# Patient Record
Sex: Female | Born: 1940 | Race: White | Hispanic: No | Smoking: Former smoker
Health system: Southern US, Community
[De-identification: ages and names within clinical notes are randomized; demographics above are authoritative.]

## PROBLEM LIST (undated history)

## (undated) DIAGNOSIS — M2010 Hallux valgus (acquired), unspecified foot: Secondary | ICD-10-CM

## (undated) DIAGNOSIS — K2270 Barrett's esophagus without dysplasia: Secondary | ICD-10-CM

## (undated) DIAGNOSIS — Z8719 Personal history of other diseases of the digestive system: Secondary | ICD-10-CM

## (undated) DIAGNOSIS — K5790 Diverticulosis of intestine, part unspecified, without perforation or abscess without bleeding: Secondary | ICD-10-CM

## (undated) DIAGNOSIS — K219 Gastro-esophageal reflux disease without esophagitis: Secondary | ICD-10-CM

## (undated) DIAGNOSIS — R252 Cramp and spasm: Secondary | ICD-10-CM

## (undated) DIAGNOSIS — K635 Polyp of colon: Secondary | ICD-10-CM

## (undated) DIAGNOSIS — M159 Polyosteoarthritis, unspecified: Secondary | ICD-10-CM

## (undated) DIAGNOSIS — E785 Hyperlipidemia, unspecified: Secondary | ICD-10-CM

## (undated) DIAGNOSIS — D126 Benign neoplasm of colon, unspecified: Secondary | ICD-10-CM

## (undated) DIAGNOSIS — N83202 Unspecified ovarian cyst, left side: Secondary | ICD-10-CM

## (undated) DIAGNOSIS — Z9889 Other specified postprocedural states: Secondary | ICD-10-CM

## (undated) DIAGNOSIS — K649 Unspecified hemorrhoids: Secondary | ICD-10-CM

## (undated) DIAGNOSIS — Z889 Allergy status to unspecified drugs, medicaments and biological substances status: Secondary | ICD-10-CM

## (undated) DIAGNOSIS — N819 Female genital prolapse, unspecified: Secondary | ICD-10-CM

## (undated) DIAGNOSIS — N281 Cyst of kidney, acquired: Secondary | ICD-10-CM

## (undated) DIAGNOSIS — I639 Cerebral infarction, unspecified: Secondary | ICD-10-CM

## (undated) HISTORY — DX: Other specified postprocedural states: Z98.890

## (undated) HISTORY — DX: Barrett's esophagus without dysplasia: K22.70

## (undated) HISTORY — DX: Gastro-esophageal reflux disease without esophagitis: K21.9

## (undated) HISTORY — PX: EYE SURGERY: SHX253

## (undated) HISTORY — PX: OTHER SURGICAL HISTORY: SHX169

## (undated) HISTORY — DX: Hyperlipidemia, unspecified: E78.5

## (undated) HISTORY — PX: ABDOMINAL HYSTERECTOMY: SHX81

## (undated) HISTORY — PX: JOINT REPLACEMENT: SHX530

## (undated) HISTORY — PX: APPENDECTOMY: SHX54

## (undated) HISTORY — PX: TONSILLECTOMY: SUR1361

## (undated) SURGERY — Surgical Case
Anesthesia: *Unknown

---

## 2005-06-22 HISTORY — PX: OTHER SURGICAL HISTORY: SHX169

## 2006-01-25 ENCOUNTER — Ambulatory Visit: Payer: Self-pay | Admitting: Unknown Physician Specialty

## 2006-02-12 ENCOUNTER — Ambulatory Visit: Payer: Self-pay | Admitting: Surgery

## 2006-06-22 HISTORY — PX: OSTEOTOMY TOES: SUR996

## 2007-01-13 ENCOUNTER — Ambulatory Visit: Payer: Self-pay | Admitting: Internal Medicine

## 2007-04-22 ENCOUNTER — Ambulatory Visit: Payer: Self-pay | Admitting: Internal Medicine

## 2007-04-22 ENCOUNTER — Other Ambulatory Visit: Payer: Self-pay

## 2007-05-06 ENCOUNTER — Ambulatory Visit: Payer: Self-pay | Admitting: Podiatry

## 2008-05-24 ENCOUNTER — Ambulatory Visit: Payer: Self-pay | Admitting: Internal Medicine

## 2008-05-31 ENCOUNTER — Ambulatory Visit: Payer: Self-pay | Admitting: Internal Medicine

## 2008-06-22 HISTORY — PX: CATARACT EXTRACTION: SUR2

## 2008-09-25 ENCOUNTER — Ambulatory Visit: Payer: Self-pay | Admitting: Internal Medicine

## 2008-10-04 ENCOUNTER — Ambulatory Visit: Payer: Self-pay | Admitting: Internal Medicine

## 2008-12-20 DIAGNOSIS — K2270 Barrett's esophagus without dysplasia: Secondary | ICD-10-CM

## 2008-12-20 HISTORY — PX: CARDIAC CATHETERIZATION: SHX172

## 2008-12-20 HISTORY — DX: Barrett's esophagus without dysplasia: K22.70

## 2009-01-08 ENCOUNTER — Ambulatory Visit: Payer: Self-pay | Admitting: Internal Medicine

## 2009-01-31 ENCOUNTER — Ambulatory Visit: Payer: Self-pay | Admitting: Unknown Physician Specialty

## 2009-06-22 DIAGNOSIS — Z9889 Other specified postprocedural states: Secondary | ICD-10-CM

## 2009-06-22 HISTORY — DX: Other specified postprocedural states: Z98.890

## 2009-11-26 ENCOUNTER — Ambulatory Visit: Payer: Self-pay | Admitting: Internal Medicine

## 2010-04-30 LAB — HM COLONOSCOPY

## 2010-05-01 LAB — HM MAMMOGRAPHY: HM Mammogram: NORMAL

## 2010-05-01 LAB — HM COLONOSCOPY

## 2010-05-20 ENCOUNTER — Ambulatory Visit: Payer: Self-pay | Admitting: Unknown Physician Specialty

## 2010-05-22 LAB — PATHOLOGY REPORT

## 2011-04-06 ENCOUNTER — Encounter: Payer: Self-pay | Admitting: Internal Medicine

## 2011-04-06 ENCOUNTER — Ambulatory Visit (INDEPENDENT_AMBULATORY_CARE_PROVIDER_SITE_OTHER): Payer: Medicare Other | Admitting: Internal Medicine

## 2011-04-06 DIAGNOSIS — I8390 Asymptomatic varicose veins of unspecified lower extremity: Secondary | ICD-10-CM

## 2011-04-06 DIAGNOSIS — E7849 Other hyperlipidemia: Secondary | ICD-10-CM | POA: Insufficient documentation

## 2011-04-06 DIAGNOSIS — Z1239 Encounter for other screening for malignant neoplasm of breast: Secondary | ICD-10-CM

## 2011-04-06 DIAGNOSIS — R5383 Other fatigue: Secondary | ICD-10-CM

## 2011-04-06 DIAGNOSIS — K2270 Barrett's esophagus without dysplasia: Secondary | ICD-10-CM | POA: Insufficient documentation

## 2011-04-06 DIAGNOSIS — M629 Disorder of muscle, unspecified: Secondary | ICD-10-CM

## 2011-04-06 DIAGNOSIS — H6120 Impacted cerumen, unspecified ear: Secondary | ICD-10-CM

## 2011-04-06 DIAGNOSIS — Z9889 Other specified postprocedural states: Secondary | ICD-10-CM | POA: Insufficient documentation

## 2011-04-06 DIAGNOSIS — Z1211 Encounter for screening for malignant neoplasm of colon: Secondary | ICD-10-CM | POA: Insufficient documentation

## 2011-04-06 DIAGNOSIS — E559 Vitamin D deficiency, unspecified: Secondary | ICD-10-CM

## 2011-04-06 DIAGNOSIS — K219 Gastro-esophageal reflux disease without esophagitis: Secondary | ICD-10-CM | POA: Insufficient documentation

## 2011-04-06 DIAGNOSIS — R5381 Other malaise: Secondary | ICD-10-CM

## 2011-04-06 DIAGNOSIS — M7631 Iliotibial band syndrome, right leg: Secondary | ICD-10-CM | POA: Insufficient documentation

## 2011-04-06 LAB — COMPREHENSIVE METABOLIC PANEL
CO2: 28 mEq/L (ref 19–32)
Creatinine, Ser: 0.8 mg/dL (ref 0.4–1.2)
GFR: 73.83 mL/min (ref >60.00)
Glucose, Bld: 92 mg/dL (ref 70–99)
Total Bilirubin: 0.6 mg/dL (ref 0.3–1.2)

## 2011-04-06 LAB — TSH: TSH: 1.32 u[IU]/mL (ref 0.35–5.50)

## 2011-04-06 NOTE — Assessment & Plan Note (Signed)
She had a diagnostic colonoscopy Sept 2011 for heme positive stool, a polyp was removed (elliott).

## 2011-04-06 NOTE — Assessment & Plan Note (Signed)
Recommended use of heat to relax muscles, followed by resuming PT exercises and contineu use of Alleve for inflammation

## 2011-04-06 NOTE — Patient Instructions (Signed)
We are checking your thyroid and vitmain d levels today   Try the ear candle to remove the wax buildup in your right ear.  If it does not work, return for an Web designer.  We will call you with your mammogram and vascular evaluation appointments

## 2011-04-06 NOTE — Progress Notes (Signed)
  Subjective:    Patient ID: Karen Parsons, female    DOB: 09-07-1940, 70 y.o.   MRN: [id number]  HPI Healthy postmenopausal woman 68, yr old scheduled for annual medicare physical, Presents with right lateral leg pain, persistent for several months.   She had a previous evaluation by [facility] who did x fays of hip and leg and diagnosed iliotibial tract syndrome and gae her exercises to dow,  Prior PT evaluation not helpful per patient.  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  inflammation.  She has deferred the female gyn and breast exam but does want a mammogrram.  Colonoscopy 2011,  [name]  Snare some polyps. Recordsnneedss.  Does not want DEXA.     Review of Systems     Objective:   Physical Exam        Assessment & Plan:

## 2011-04-24 ENCOUNTER — Encounter: Payer: Self-pay | Admitting: Internal Medicine

## 2011-04-24 ENCOUNTER — Ambulatory Visit (INDEPENDENT_AMBULATORY_CARE_PROVIDER_SITE_OTHER): Payer: Medicare Other | Admitting: Internal Medicine

## 2011-04-24 VITALS — BP 108/72 | HR 70 | Temp 98.3°F | Resp 14 | Ht 62.5 in | Wt 123.5 lb

## 2011-04-24 DIAGNOSIS — H6120 Impacted cerumen, unspecified ear: Secondary | ICD-10-CM

## 2011-04-24 NOTE — Progress Notes (Signed)
  Subjective:    Patient ID: Karen Parsons, female    DOB: 05-25-1941, 70 y.o.   MRN: [id number]  HPI patient retursn for cerumen disimpaction of right ear.  Has been trying Debrox and ear candles at home without results.     Review of Systems  HENT: Positive for hearing loss.        Objective:   Physical Exam  HENT:  Right Ear: Decreased hearing is noted.          Assessment & Plan:  Cerumen impaction:  Ear was irrigated with warm water continuously and canal was debrided of cerumen.

## 2011-04-30 ENCOUNTER — Encounter: Payer: Self-pay | Admitting: Internal Medicine

## 2011-10-02 ENCOUNTER — Other Ambulatory Visit: Payer: Self-pay | Admitting: Internal Medicine

## 2011-10-02 ENCOUNTER — Telehealth: Payer: Self-pay | Admitting: *Deleted

## 2011-10-02 ENCOUNTER — Ambulatory Visit: Payer: Medicare Other

## 2011-10-02 MED ORDER — AMLODIPINE BESYLATE 5 MG PO TABS: 5.0000 mg | ORAL_TABLET | Freq: Every day | ORAL | Status: DC

## 2011-10-02 NOTE — Telephone Encounter (Signed)
Triage Record Num: [id number] Operator: Lyn Hollingshead Patient Name: Karen Parsons Call Date & Time: 10/02/2011 3:53:10PM Patient Phone: [phone] PCP: Duncan Dull Patient Gender: Female PCP Fax : [phone] Patient DOB: December 21, 1940 Practice Name: [facility] Reason for Call: Caller: Karen Parsons/Patient; PCP: Duncan Dull; CB#: [phone]; ; ; Call regarding Dizzy Spells, Thinks May Be Allergy Meds;States she wants to stop allery medicine because she thinks it makes her dizzy. She is taking Aller-tec. She made an appt for Monday already but wants to cancel. Call transferred to office to cancel, unable to access cone epic. Protocol(s) Used: Office Note Recommended Outcome per Protocol: Information Noted and Sent to Office Reason for Outcome: Caller information to office Care Advice: ~     Patient is coming in today for a blood pressure check.  04/

## 2011-10-02 NOTE — Telephone Encounter (Signed)
[employer] (Daytime Triage) Fax: [phone] From: [facility] Date/ Time: 10/02/2011 9:54 AM Taken By: Lesli Albee, RN Caller: Adean Facility: Not Collected Patient: Karen Parsons, Karen Parsons DOB: July 16, 1940 Phone: [phone] Reason for Call: Caller was unable to be reached on callback - No answer, VM full/broken Regarding Appointment: No Appt Date: Appt Time: Unknown Provider: Reason: Details: Outcome:

## 2011-10-05 ENCOUNTER — Encounter: Payer: Self-pay | Admitting: Internal Medicine

## 2011-10-05 ENCOUNTER — Ambulatory Visit (INDEPENDENT_AMBULATORY_CARE_PROVIDER_SITE_OTHER): Payer: Medicare Other | Admitting: Internal Medicine

## 2011-10-05 VITALS — BP 112/68 | HR 66 | Temp 98.2°F | Resp 14 | Wt 124.0 lb

## 2011-10-05 DIAGNOSIS — K219 Gastro-esophageal reflux disease without esophagitis: Secondary | ICD-10-CM

## 2011-10-05 DIAGNOSIS — E785 Hyperlipidemia, unspecified: Secondary | ICD-10-CM

## 2011-10-05 DIAGNOSIS — R42 Dizziness and giddiness: Secondary | ICD-10-CM | POA: Insufficient documentation

## 2011-10-05 NOTE — Patient Instructions (Signed)
Low glycemic index diet, eating 6 smaller meals daily  Protein  Shakes (EAS Carb Control  Or Atkins ,  Available everywhere,   In  cases at [employer]Js )  2.5 carbs  Protein bar by Atkins (snack size,  [employer]'s)  At 10 am  Lunch: sandwich on pita bread (Joseph's makes a pita bread and a flat bread , available at [employer] and [employer]) Also Toufayah make a low carb flatbread  And Mission makes a low  carb whole wheat tortilla, both available at [employer] and [employer]  Mid day :  Another protein bar,  Or cheese stick, almonds, walnuts, pistachios, pecans, peanuts,  Macadamia nuts  Lunch:  "mean and green:"  Meat, salad, and green veggie : use ranch, vinagrette,  Blue cheese, etc  Evening : Breyer's low carb fudgiscle, ice cream (Carb Smart)   _______________________________________________________________  Try Simply saline nasal spray twice daily   Use benadryl 25 mg every 8 hours and Sudafed PE 10 mg every 8 hours to manage the drainage and congestion.  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

## 2011-10-07 ENCOUNTER — Encounter: Payer: Self-pay | Admitting: Internal Medicine

## 2011-10-07 NOTE — Progress Notes (Signed)
Patient ID: Karen Parsons, female   DOB: 05-24-1941, 71 y.o.   MRN: [id number]  Patient Active Problem List  Diagnoses  . Iliotibial band syndrome of right side  . Hyperlipidemia  . GERD (gastroesophageal reflux disease)  . S/P colonoscopy  . Barrett's esophagus  . Screening for colon cancer  . Dizziness - light-headed    Subjective:  CC:   Chief Complaint  Patient presents with  . Dizziness    HPI:   Karen Parsons a 71 y.o. female who presents Recent episode of mild dizziness without true vertigo,  Compounded by home blood pressure readings which were elevated to 160/100.  Since then has had her bp checked here which was normal, and discovered that her home machine was in need of new batteries..  Home readings have now been normal since she addressed the battery issue.  Has had some sinus congestion and post nasal drip without fevers , facial or ear pain.   No headaches or neack aches.  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 No recent travel. No chest pain, shortnesws of breath, or cough.    Past Medical History  Diagnosis Date  . Hyperlipidemia     intolerant of statins, normal diagnostic cath July 2010 Gwen Pounds)  . GERD (gastroesophageal reflux disease)   . Osteoporosis     by DEXA 2008  . S/P colonoscopy 2011    [name], polyp removed  . Barrett's esophagus July 2010    Wohl,     Past Surgical History  Procedure Date  . Abdominal hysterectomy     age 61, menorrhagia         The following portions of the patient's history were reviewed and updated as appropriate: Allergies, current medications, and problem list.    Review of Systems:   12 Pt  review of systems was negative except those addressed in the HPI,     History   Social History  . Marital Status: Widowed    Spouse Name: N/A    Number of Children: N/A  . Years of Education: N/A   Occupational History  . Not on file.   Social History Main Topics  . Smoking status:  Former Smoker    Quit date: 06/22/1968  . Smokeless tobacco: Never Used  . Alcohol Use: 1.5 oz/week    3 drink(s) per week     wine x1 a night  . Drug Use: No  . Sexually Active: Not on file   Other Topics Concern  . Not on file   Social History Narrative  . No narrative on file    Objective:  BP 112/68  Pulse 66  Temp(Src) 98.2 F (36.8 C) (Oral)  Resp 14  Wt 124 lb (56.246 kg)  SpO2 100%  General appearance: alert, cooperative and appears stated age Ears: normal TM's and external ear canals both ears Throat: lips, mucosa, and tongue normal; teeth and gums normal Neck: no adenopathy, no carotid bruit, supple, symmetrical, trachea midline and thyroid not enlarged, symmetric, no tenderness/mass/nodules Back: symmetric, no curvature. ROM normal. No CVA tenderness. Lungs: clear to auscultation bilaterally Heart: regular rate and rhythm, S1, S2 normal, no murmur, click, rub or gallop Abdomen: soft, non-tender; bowel sounds normal; no masses,  no organomegaly Pulses: 2+ and symmetric Skin: Skin color, texture, turgor normal. No rashes or lesions Lymph nodes: Cervical, supraclavicular, and axillary nodes normal.  Assessment and Plan:  Dizziness - light-headed She has a normal exam.  Symptoms are mild and appear to  be due to mild sinus congestion/Eustachian tube dysfunction. Supportive therapy recommended .  Return if symptoms escalate.   Hyperlipidemia Mild, to be managed with low glycemic index diet.   GERD (gastroesophageal reflux disease) With Barrett's esophagus noted on prior endoscopy,.  Continue daily prilosec.     Updated Medication List Outpatient Encounter Prescriptions as of 10/05/2011  Medication Sig Dispense Refill  . Chlorpheniramine Maleate (ALLERGY PO) Take by mouth.      Marland Kitchen omeprazole (PRILOSEC) 20 MG capsule Take 20 mg by mouth daily.        Marland Kitchen DISCONTD: amLODipine (NORVASC) 5 MG tablet Take 1 tablet (5 mg total) by mouth daily.  30 tablet  3      No orders of the defined types were placed in this encounter.    Return in about 3 years (around 10/05/2014).

## 2011-10-07 NOTE — Assessment & Plan Note (Signed)
She has a normal exam.  Symptoms are mild and appear to be due to mild sinus congestion/Eustachian tube dysfunction. Supportive therapy recommended .  Return if symptoms escalate.

## 2011-10-07 NOTE — Assessment & Plan Note (Signed)
With Barrett's esophagus noted on prior endoscopy,.  Continue daily prilosec.

## 2011-10-07 NOTE — Assessment & Plan Note (Signed)
Mild, to be managed with low glycemic index diet.

## 2011-10-23 ENCOUNTER — Ambulatory Visit (INDEPENDENT_AMBULATORY_CARE_PROVIDER_SITE_OTHER): Payer: Medicare Other | Admitting: Internal Medicine

## 2011-10-23 VITALS — BP 120/72 | HR 82 | Temp 97.6°F | Resp 14 | Ht 63.0 in | Wt 125.8 lb

## 2011-10-23 DIAGNOSIS — H6690 Otitis media, unspecified, unspecified ear: Secondary | ICD-10-CM

## 2011-10-23 DIAGNOSIS — H6693 Otitis media, unspecified, bilateral: Secondary | ICD-10-CM

## 2011-10-23 MED ORDER — AZITHROMYCIN 500 MG PO TABS: 500.0000 mg | ORAL_TABLET | Freq: Every day | ORAL | Status: AC

## 2011-10-23 MED ORDER — PREDNISONE 10 MG PO TABS: ORAL_TABLET | ORAL | Status: DC

## 2011-10-25 ENCOUNTER — Encounter: Payer: Self-pay | Admitting: Internal Medicine

## 2011-10-25 DIAGNOSIS — H6693 Otitis media, unspecified, bilateral: Secondary | ICD-10-CM | POA: Insufficient documentation

## 2011-10-25 NOTE — Assessment & Plan Note (Signed)
Bilateral secondary to prolonged sinus congestion. Will treat with azithromycin x7 days, due to penicillin allergy, and six-day prednisone taper. I have asked her to stop the evening dose of Sudafed and use Afrin if needed for congestion.

## 2011-10-25 NOTE — Progress Notes (Signed)
Patient ID: Karen Parsons, female   DOB: January 25, 1941, 71 y.o.   MRN: [id number] ote Patient Active Problem List  Diagnoses  . Iliotibial band syndrome of right side  . Hyperlipidemia  . GERD (gastroesophageal reflux disease)  . S/P colonoscopy  . Barrett's esophagus  . Screening for colon cancer  . Dizziness - light-headed  . Otitis media of both ears    Subjective:  CC:   No chief complaint on file.   HPI:   Karen Parsons a 71 y.o. female who presents For follow up on viral URI and cerumen impaction impaction noted  last week during evaluation for dizziness. .  the dizziness has resolved with management of the viral URI. She has been taking Benadyl and sudafed as directed but has noticed that at night when she lies down her ears have been throbbing and pulsating. Her symptoms are controlled during the day. She does drink a glass of one or 2 at night and wonders if the combination of Sudafed and wine may be e causing her symptoms. She denies headaches sinus drainage sinus pain and sore throat. No fevers.   Past Medical History  Diagnosis Date  . Hyperlipidemia     intolerant of statins, normal diagnostic cath July 2010 Gwen Pounds)  . GERD (gastroesophageal reflux disease)   . Osteoporosis     by DEXA 2008  . S/P colonoscopy 2011    [name], polyp removed  . Barrett's esophagus July 2010    Wohl,     Past Surgical History  Procedure Date  . Abdominal hysterectomy     age 68, menorrhagia         The following portions of the patient's history were reviewed and updated as appropriate: Allergies, current medications, and problem list.    Review of Systems:   12 Pt  review of systems was negative except those addressed in the HPI,     History   Social History  . Marital Status: Widowed    Spouse Name: N/A    Number of Children: N/A  . Years of Education: N/A   Occupational History  . Not on file.   Social History Main Topics  . Smoking status: Former  Smoker    Quit date: 06/22/1968  . Smokeless tobacco: Never Used  . Alcohol Use: 1.5 oz/week    3 drink(s) per week     wine x1 a night  . Drug Use: No  . Sexually Active: Not on file   Other Topics Concern  . Not on file   Social History Narrative  . No narrative on file    Objective:  BP 120/72  Pulse 82  Temp 97.6 F (36.4 C)  Resp 14  Ht 5\' 3"  (1.6 m)  Wt 125 lb 12 oz (57.04 kg)  BMI 22.28 kg/m2  SpO2 97%  General appearance: alert, cooperative and appears stated age Ears: normal TM's and external ear canals both ears Throat: lips, mucosa, and tongue normal; teeth and gums normal Neck: no adenopathy, no carotid bruit, supple, symmetrical, trachea midline and thyroid not enlarged, symmetric, no tenderness/mass/nodules HEENT: no sinus tenderness.  Bilateral TM injection and effusion.  Back: symmetric, no curvature. ROM normal. No CVA tenderness. Lungs: clear to auscultation bilaterally Heart: regular rate and rhythm, S1, S2 normal, no murmur, click, rub or gallop Abdomen: soft, non-tender; bowel sounds normal; no masses,  no organomegaly Pulses: 2+ and symmetric Skin: Skin color, texture, turgor normal. No rashes or lesions Lymph nodes: Cervical, supraclavicular, and axillary nodes  normal.  Assessment and Plan:  Otitis media of both ears Bilateral secondary to prolonged sinus congestion. Will treat with azithromycin x7 days, due to penicillin allergy, and six-day prednisone taper. I have asked her to stop the evening dose of Sudafed and use Afrin if needed for congestion.    Updated Medication List Outpatient Encounter Prescriptions as of 10/23/2011  Medication Sig Dispense Refill  . azithromycin (ZITHROMAX) 500 MG tablet Take 1 tablet (500 mg total) by mouth daily.  7 tablet  0  . Chlorpheniramine Maleate (ALLERGY PO) Take by mouth.      Marland Kitchen omeprazole (PRILOSEC) 20 MG capsule Take 20 mg by mouth daily.        . predniSONE (DELTASONE) 10 MG tablet 60 mg on Day 1,  decrease by 10 mg daily until gone.  21 tablet  0     No orders of the defined types were placed in this encounter.    No Follow-up on file.

## 2011-11-19 ENCOUNTER — Emergency Department: Payer: Self-pay | Admitting: Emergency Medicine

## 2011-11-30 ENCOUNTER — Telehealth: Payer: Self-pay | Admitting: Internal Medicine

## 2011-11-30 DIAGNOSIS — M79671 Pain in right foot: Secondary | ICD-10-CM

## 2011-11-30 NOTE — Telephone Encounter (Signed)
I called patient and notified her of the message, she stated this has been over a week and a half ago and she went straight to the ED then.  They did x rays and stated nothing was broken.  She stated she is much better now, and will call back in one week if her arm still has the bruise.
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
## 2011-11-30 NOTE — Telephone Encounter (Signed)
Ice ankle. Elevate leg often for the swelling, send to [facility] for  xrays of right heel.  i will put order in epic.  Ibuprofen 800 mg every 8 hours  Tylenol 500 every 6 hours

## 2012-02-10 ENCOUNTER — Encounter: Payer: Self-pay | Admitting: Internal Medicine

## 2012-02-17 ENCOUNTER — Ambulatory Visit: Payer: Self-pay | Admitting: Ophthalmology

## 2012-02-29 ENCOUNTER — Ambulatory Visit: Payer: Self-pay | Admitting: Ophthalmology

## 2012-04-05 ENCOUNTER — Ambulatory Visit: Payer: Self-pay | Admitting: Unknown Physician Specialty

## 2012-04-08 LAB — PATHOLOGY REPORT

## 2012-04-22 ENCOUNTER — Telehealth: Payer: Self-pay | Admitting: Internal Medicine

## 2012-04-22 NOTE — Telephone Encounter (Signed)
Caller: Marrisa/Patient; Patient Name: Karen Parsons; PCP: Duncan Dull (Adults only); Best Callback Phone Number: [phone]; Reason for call: constipation; symptoms started 04/17/12;  Ducolax was taken and had small BM on 04/18/12;  took more Ducolax on 04/21/12 and started feeling very nauseated and dizzy; vomited x1 on 04/21/12; was successful with a BM on 04/22/12 and had 2 BM's today; feeling okay today; no symptoms at this time; pt wanted to know if the Ducolax could have caused these sx on 04/21/12; explained that it could have or if she was a little dehydrated at the time;  instructed to call back if develop any symptoms; will comply; Health Information , No Triage Guideline;

## 2012-05-09 ENCOUNTER — Encounter: Payer: Self-pay | Admitting: Internal Medicine

## 2012-07-29 ENCOUNTER — Ambulatory Visit (INDEPENDENT_AMBULATORY_CARE_PROVIDER_SITE_OTHER): Payer: Medicare Other | Admitting: Internal Medicine

## 2012-07-29 ENCOUNTER — Encounter: Payer: Self-pay | Admitting: Internal Medicine

## 2012-07-29 VITALS — BP 114/68 | HR 65 | Temp 98.0°F | Resp 16 | Wt 123.5 lb

## 2012-07-29 DIAGNOSIS — R5381 Other malaise: Secondary | ICD-10-CM

## 2012-07-29 DIAGNOSIS — R5383 Other fatigue: Secondary | ICD-10-CM

## 2012-07-29 DIAGNOSIS — Z9889 Other specified postprocedural states: Secondary | ICD-10-CM

## 2012-07-29 DIAGNOSIS — M7551 Bursitis of right shoulder: Secondary | ICD-10-CM

## 2012-07-29 DIAGNOSIS — M67919 Unspecified disorder of synovium and tendon, unspecified shoulder: Secondary | ICD-10-CM

## 2012-07-29 DIAGNOSIS — K2270 Barrett's esophagus without dysplasia: Secondary | ICD-10-CM

## 2012-07-29 DIAGNOSIS — Z79899 Other long term (current) drug therapy: Secondary | ICD-10-CM

## 2012-07-29 LAB — COMPREHENSIVE METABOLIC PANEL
ALT: 22 U/L (ref 0–35)
BUN: 23 mg/dL (ref 6–23)
CO2: 30 mEq/L (ref 19–32)
Calcium: 9.5 mg/dL (ref 8.4–10.5)
Chloride: 103 mEq/L (ref 96–112)
Creatinine, Ser: 0.9 mg/dL (ref 0.4–1.2)
GFR: 65.14 mL/min (ref >60.00)
Total Bilirubin: 0.8 mg/dL (ref 0.3–1.2)

## 2012-07-29 LAB — CBC WITH DIFFERENTIAL/PLATELET
Basophils Relative: 0.6 % (ref 0.0–3.0)
Eosinophils Relative: 3.3 % (ref 0.0–5.0)
Hemoglobin: 13.2 g/dL (ref 12.0–15.0)
Lymphocytes Relative: 28.3 % (ref 12.0–46.0)
Monocytes Relative: 8.5 % (ref 3.0–12.0)
Neutro Abs: 3.6 10*3/uL (ref 1.4–7.7)
Neutrophils Relative %: 59.3 % (ref 43.0–77.0)
RBC: 4.26 Mil/uL (ref 3.87–5.11)
WBC: 6.1 10*3/uL (ref 4.5–10.5)

## 2012-07-29 NOTE — Patient Instructions (Addendum)
Your shoulder pain sounds like bursitis and can be treated with aleve according to instruction on bottle, and you can add tylenol 500 mg up to 4 tablets daily.  Avoid any weight for 4 weeks .  Keep doing ROM  But not weights or bands.   Mission carb balance whole wheat burrito is 6 net carbs   Dreamfield's low carb pasta  Is delicious  And 5 net carbs !!

## 2012-07-29 NOTE — Progress Notes (Signed)
Patient ID: Karen Parsons, female   DOB: 19-Aug-1940, 72 y.o.   MRN: [id number]  Patient Active Problem List  Diagnosis  . Hyperlipidemia  . GERD (gastroesophageal reflux disease)  . S/P colonoscopy  . Barrett's esophagus  . Dizziness - light-headed  . Bursitis of right shoulder    Subjective:  CC:   Chief Complaint  Patient presents with  . Shoulder pain    right side    HPI:   Karen Whiteis a 72 y.o. female who presents for evaluation of right shoulder pain.  The pain has been present for one month.  It has been aggravated by recent change in exerise regimen to include use of light weights at the [facility].  The pain is located in the deltoid region and brought on by abduction and internal rotation ; also brought on by picking up a bottle of laundry detergent with an outstretched arm    Past Medical History  Diagnosis Date  . Hyperlipidemia     intolerant of statins, normal diagnostic cath July 2010 Gwen Pounds)  . GERD (gastroesophageal reflux disease)   . Osteoporosis     by DEXA 2008  . S/P colonoscopy 2011    [name], polyp removed  . Barrett's esophagus July 2010    Wohl,     Past Surgical History  Procedure Laterality Date  . Abdominal hysterectomy      age 12, menorrhagia  . Cardiac catheterization Left July 2010    [name]:  normal  . Medial meniscotomy Left 2007    Reita Chard  . Osteotomy toes Right 2008    [name]  . Cataract extraction Left 2010    [name]   The following portions of the patient's history were reviewed and updated as appropriate: Allergies, current medications, and problem list.  Review of Systems:   Patient denies headache, fevers, malaise, unintentional weight loss, skin rash, eye pain, sinus congestion and sinus pain, sore throat, dysphagia,  hemoptysis , cough, dyspnea, wheezing, chest pain, palpitations, orthopnea, edema, abdominal pain, nausea, melena, diarrhea, constipation, flank pain, dysuria, hematuria, urinary  Frequency,  nocturia, numbness, tingling, seizures,  Focal weakness, Loss of consciousness,  Tremor, insomnia, depression, anxiety, and suicidal ideation.     History   Social History  . Marital Status: Widowed    Spouse Name: N/A    Number of Children: N/A  . Years of Education: N/A   Occupational History  . Not on file.   Social History Main Topics  . Smoking status: Former Smoker    Quit date: 06/22/1968  . Smokeless tobacco: Never Used  . Alcohol Use: 1.5 oz/week    3 drink(s) per week     Comment: wine x1 a night  . Drug Use: No  . Sexually Active: Not on file   Other Topics Concern  . Not on file   Social History Narrative  . No narrative on file    Objective:  BP 114/68  Pulse 65  Temp(Src) 98 F (36.7 C) (Oral)  Resp 16  Wt 123 lb 8 oz (56.019 kg)  BMI 21.88 kg/m2  SpO2 97%  General appearance: alert, cooperative and appears stated age Neck: no adenopathy, no carotid bruit, supple, symmetrical, trachea midline and thyroid not enlarged, symmetric, no tenderness/mass/nodules Back: symmetric, no curvature. ROM normal. No CVA tenderness.  Lungs: clear to auscultation bilaterally Heart: regular rate and rhythm, S1, S2 normal, no murmur, click, rub or gallop Abdomen: soft, non-tender; bowel sounds normal; no masses,  no organomegaly Pulses: 2+ and  symmetric MSK:  Right shoulder with full ROM, no pain with passive motions. Pain elicited with forced abduction flexion and internal rotaiton  Assessment and Plan:  Bursitis of right shoulder Suggested by history and exam,.  Rec suspension of of all weights , use of NSAIDS and follow up in  4 weeks    Updated Medication List Outpatient Encounter Prescriptions as of 07/29/2012  Medication Sig Dispense Refill  . omeprazole (PRILOSEC) 20 MG capsule Take 20 mg by mouth daily.        Marland Kitchen triamcinolone cream (KENALOG) 0.1 % Apply to skin twice daily.      . [DISCONTINUED] Chlorpheniramine Maleate (ALLERGY PO) Take by mouth.      .  [DISCONTINUED] predniSONE (DELTASONE) 10 MG tablet 60 mg on Day 1, decrease by 10 mg daily until gone.  21 tablet  0   No facility-administered encounter medications on file as of 07/29/2012.     Orders Placed This Encounter  Procedures  . Comprehensive metabolic panel  . TSH  . CBC with Differential  . HM COLONOSCOPY  . HM COLONOSCOPY    No Follow-up on file.

## 2012-07-31 ENCOUNTER — Encounter: Payer: Self-pay | Admitting: Internal Medicine

## 2012-07-31 DIAGNOSIS — M7551 Bursitis of right shoulder: Secondary | ICD-10-CM | POA: Insufficient documentation

## 2012-07-31 NOTE — Assessment & Plan Note (Signed)
Suggested by history and exam,.  Rec suspension of of all weights , use of NSAIDS and follow up in  4 weeks

## 2012-08-01 ENCOUNTER — Encounter: Payer: Self-pay | Admitting: *Deleted

## 2012-10-27 ENCOUNTER — Ambulatory Visit (INDEPENDENT_AMBULATORY_CARE_PROVIDER_SITE_OTHER): Payer: Medicare Other | Admitting: Internal Medicine

## 2012-10-27 ENCOUNTER — Encounter: Payer: Self-pay | Admitting: Internal Medicine

## 2012-10-27 VITALS — BP 112/68 | HR 99 | Temp 98.1°F | Resp 14 | Wt 121.2 lb

## 2012-10-27 DIAGNOSIS — L03039 Cellulitis of unspecified toe: Secondary | ICD-10-CM

## 2012-10-27 DIAGNOSIS — Z1239 Encounter for other screening for malignant neoplasm of breast: Secondary | ICD-10-CM

## 2012-10-27 DIAGNOSIS — L02619 Cutaneous abscess of unspecified foot: Secondary | ICD-10-CM

## 2012-10-27 DIAGNOSIS — D126 Benign neoplasm of colon, unspecified: Secondary | ICD-10-CM

## 2012-10-27 DIAGNOSIS — L02611 Cutaneous abscess of right foot: Secondary | ICD-10-CM

## 2012-10-27 DIAGNOSIS — M7551 Bursitis of right shoulder: Secondary | ICD-10-CM

## 2012-10-27 DIAGNOSIS — M719 Bursopathy, unspecified: Secondary | ICD-10-CM

## 2012-10-27 MED ORDER — CEPHALEXIN 500 MG PO TABS: 500.0000 mg | ORAL_TABLET | Freq: Three times a day (TID) | ORAL | Status: DC

## 2012-10-27 MED ORDER — TRAMADOL HCL 50 MG PO TABS: 50.0000 mg | ORAL_TABLET | Freq: Three times a day (TID) | ORAL | Status: DC | PRN

## 2012-10-27 NOTE — Progress Notes (Signed)
Patient ID: Karen Parsons, female   DOB: [birth year], 72 y.o.   MRN: [id number]   Patient Active Problem List   Diagnosis Date Noted  . Cellulitis and abscess of toe 10/29/2012  . Screening for breast cancer 10/29/2012  . Tubular adenoma of colon 10/29/2012  . Bursitis of right shoulder 07/31/2012  . Dizziness - light-headed 10/05/2011  . Hyperlipidemia   . GERD (gastroesophageal reflux disease)   . S/P colonoscopy   . Barrett's esophagus     Subjective:  CC:   Chief Complaint  Patient presents with  . Acute Visit    C\O right 3 phalang on right foot Karen Parsons nodule with redness.    HPI:   Karen Parsons a 72 y.o. female who presents with pain and swelling of the medial side of her 3rd toe on  right foot 5 days ago.  Symptoms started after treated a self diagnosed corn with a medicated pad for corn reduction .  After several days of wearing the pad, she noticed the skin become swollen with a central punctum.  elling,   Tried soaking in epsom salts and aleve for pain with moderate transient relief of pain without change in skin appearance,  No systemic  symptoms    Past Medical History  Diagnosis Date  . Hyperlipidemia     intolerant of statins, normal diagnostic cath July 2010 Karen Parsons)  . GERD (gastroesophageal reflux disease)   . Osteoporosis     by DEXA 2008  . S/P colonoscopy 2011    Karen Parsons, polyp removed  . Barrett's esophagus July 2010    Karen Parsons,     Past Surgical History  Procedure Laterality Date  . Abdominal hysterectomy      age 48, menorrhagia  . Cardiac catheterization Left July 2010    Karen Parsons:  normal  . Medial meniscotomy Left 2007    Karen Parsons  . Osteotomy toes Right 2008    Karen Parsons  . Cataract extraction Left 2010    Karen Parsons       The following portions of the patient's history were reviewed and updated as appropriate: Allergies, current medications, and problem list.    Review of Systems:   Patient denies headache, fevers, malaise,  unintentional weight loss, skin rash, eye pain, sinus congestion and sinus pain, sore throat, dysphagia,  hemoptysis , cough, dyspnea, wheezing, chest pain, palpitations, orthopnea, edema, abdominal pain, nausea, melena, diarrhea, constipation, flank pain, dysuria, hematuria, urinary  Frequency, nocturia, numbness, tingling, seizures,  Focal weakness, Loss of consciousness,  Tremor, insomnia, depression, anxiety, and suicidal ideation.     History   Social History  . Marital Status: Widowed    Spouse Name: N/A    Number of Children: N/A  . Years of Education: N/A   Occupational History  . Not on file.   Social History Main Topics  . Smoking status: Former Smoker    Quit date: 06/22/1968  . Smokeless tobacco: Never Used  . Alcohol Use: 1.5 oz/week    3 drink(s) per week     Comment: wine x1 a night  . Drug Use: No  . Sexually Active: Not on file   Other Topics Concern  . Not on file   Social History Narrative  . No narrative on file    Objective:  BP 112/68  Pulse 99  Temp(Src) 98.1 F (36.7 C) (Oral)  Resp 14  Wt 121 lb 4 oz (54.999 kg)  BMI 21.48 kg/m2  SpO2 98%  General appearance: alert, cooperative and appears  stated age Ears: normal TM's and external ear canals both ears Throat: lips, mucosa, and tongue normal; teeth and gums normal Neck: no adenopathy, no carotid bruit, supple, symmetrical, trachea midline and thyroid not enlarged, symmetric, no tenderness/mass/nodules Back: symmetric, no curvature. ROM normal. No CVA tenderness. Lungs: clear to auscultation bilaterally Heart: regular rate and rhythm, S1, S2 normal, no murmur, click, rub or gallop Abdomen: soft, non-tender; bowel sounds normal; no masses,  no organomegaly Pulses: 2+ and symmetric Skin: Skin color, texture, turgor normal. No rashes or lesions. 3rd toe medial side, macerated corn with central punctum  Lymph nodes: Cervical, supraclavicular, and axillary nodes normal.  Assessment and  Plan:  Cellulitis and abscess of toe Presumed , caused by irritation and maceration of skin surrounding her corn caused by medicated pad and soaking.  Advised to stop soaking foot,  Stop using the pad . Keflex prescribed.  Bursitis of right shoulder improved with activity modification and use of NSAIDS  Screening for breast cancer Her is overdue for her annual wellness exam and mammogram.  Referral made   Updated Medication List Outpatient Encounter Prescriptions as of 10/27/2012  Medication Sig Dispense Refill  . omeprazole (PRILOSEC) 20 MG capsule Take 20 mg by mouth daily.        . Cephalexin 500 MG tablet Take 1 tablet (500 mg total) by mouth 3 (three) times daily.  40 tablet  0  . traMADol (ULTRAM) 50 MG tablet Take 1 tablet (50 mg total) by mouth every 8 (eight) hours as needed for pain.  60 tablet  1  . triamcinolone cream (KENALOG) 0.1 % Apply to skin twice daily.       No facility-administered encounter medications on file as of 10/27/2012.     Orders Placed This Encounter  Procedures  . HM MAMMOGRAPHY  . HM DEXA SCAN  . MM Digital Screening  . HM COLONOSCOPY    No Follow-up on file.

## 2012-10-27 NOTE — Patient Instructions (Addendum)
I will call in keflex to prevent infection.   And tramadol for severe pain   Continue the alleve,  No more soaks ,   Ok to add tylenol to aleve first before you try the tramadol for severe pain   Take the keflex with food to avoid upset stomach

## 2012-10-29 ENCOUNTER — Telehealth: Payer: Self-pay | Admitting: Internal Medicine

## 2012-10-29 ENCOUNTER — Encounter: Payer: Self-pay | Admitting: Internal Medicine

## 2012-10-29 DIAGNOSIS — L02619 Cutaneous abscess of unspecified foot: Secondary | ICD-10-CM | POA: Insufficient documentation

## 2012-10-29 DIAGNOSIS — D126 Benign neoplasm of colon, unspecified: Secondary | ICD-10-CM | POA: Insufficient documentation

## 2012-10-29 DIAGNOSIS — Z1239 Encounter for other screening for malignant neoplasm of breast: Secondary | ICD-10-CM | POA: Insufficient documentation

## 2012-10-29 NOTE — Assessment & Plan Note (Signed)
Presumed , caused by irritation and maceration of skin surrounding her corn caused by medicated pad and soaking.  Advised to stop soaking foot,  Stop using the pad . Keflex prescribed.

## 2012-10-29 NOTE — Telephone Encounter (Signed)
Please confirm that she is overude for a mammogram, per review of chart.Karen Parsons through [employer]

## 2012-10-29 NOTE — Assessment & Plan Note (Signed)
Her is overdue for her annual wellness exam and mammogram.  Referral made

## 2012-10-29 NOTE — Assessment & Plan Note (Signed)
improved with activity modification and use of NSAIDS

## 2012-10-31 NOTE — Telephone Encounter (Signed)
Patient notified stated does she really need mammogram at  8? Please advise.

## 2012-11-01 NOTE — Telephone Encounter (Signed)
Patient agree's and will have mammogram.

## 2012-11-01 NOTE — Telephone Encounter (Signed)
Yes she does,  Given how healthy she is and how long I expect her to live.  (Unless she would not want any treatment if something was found, no matter how small)

## 2012-11-04 ENCOUNTER — Telehealth: Payer: Self-pay | Admitting: *Deleted

## 2012-11-04 NOTE — Telephone Encounter (Signed)
Patient says the antibiotic you have given her for her toe is giving her diarrhea and she cannot take it and show homes , she has taken for one week is it okay to stop. 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

## 2012-11-05 NOTE — Telephone Encounter (Signed)
Yes ok to stop after one week.  If the diarrhea has not improved after stopping abcx will need to be seen

## 2012-11-07 NOTE — Telephone Encounter (Signed)
Left message, advising pt and to call back if symptoms persist or worsen.

## 2012-12-01 ENCOUNTER — Ambulatory Visit: Payer: Self-pay | Admitting: Internal Medicine

## 2012-12-05 ENCOUNTER — Telehealth: Payer: Self-pay | Admitting: Internal Medicine

## 2012-12-05 DIAGNOSIS — R5383 Other fatigue: Secondary | ICD-10-CM

## 2012-12-05 DIAGNOSIS — R5381 Other malaise: Secondary | ICD-10-CM

## 2012-12-05 NOTE — Telephone Encounter (Signed)
The patient is feeling tired and she is wanting a B-12 shot.

## 2012-12-07 NOTE — Telephone Encounter (Signed)
Labs ordered to check for causes of fatigue. Needs follow up visit after labs , not same day

## 2012-12-07 NOTE — Telephone Encounter (Signed)
Called patient states she is having fatigue, and wondering could it be coming from the weather or should she have labs done to check B12, please advise.

## 2012-12-08 NOTE — Telephone Encounter (Signed)
LMTCB & schedule a lab appt & a f/u with Dr. Darrick Huntsman (not on same day)

## 2012-12-09 ENCOUNTER — Ambulatory Visit: Payer: Self-pay | Admitting: Unknown Physician Specialty

## 2012-12-09 NOTE — Telephone Encounter (Signed)
Lab appointment 6/25 and appointment with dr Darrick Huntsman 7/2   Pt aware

## 2012-12-13 LAB — PATHOLOGY REPORT

## 2012-12-14 ENCOUNTER — Other Ambulatory Visit (INDEPENDENT_AMBULATORY_CARE_PROVIDER_SITE_OTHER): Payer: Medicare Other

## 2012-12-14 DIAGNOSIS — R5381 Other malaise: Secondary | ICD-10-CM

## 2012-12-14 DIAGNOSIS — R5383 Other fatigue: Secondary | ICD-10-CM

## 2012-12-14 LAB — CBC WITH DIFFERENTIAL/PLATELET
Basophils Absolute: 0 10*3/uL (ref 0.0–0.1)
Eosinophils Relative: 2.4 % (ref 0.0–5.0)
Monocytes Absolute: 0.5 10*3/uL (ref 0.1–1.0)
Monocytes Relative: 10 % (ref 3.0–12.0)
Neutrophils Relative %: 60.1 % (ref 43.0–77.0)
Platelets: 232 10*3/uL (ref 150.0–400.0)
WBC: 4.9 10*3/uL (ref 4.5–10.5)

## 2012-12-14 LAB — BASIC METABOLIC PANEL
Calcium: 10 mg/dL (ref 8.4–10.5)
GFR: 73.48 mL/min (ref >60.00)
Sodium: 139 mEq/L (ref 135–145)

## 2012-12-14 LAB — VITAMIN B12: Vitamin B-12: 297 pg/mL (ref 211–911)

## 2012-12-15 LAB — FOLATE RBC: RBC Folate: 839 ng/mL (ref >=366)

## 2012-12-16 ENCOUNTER — Encounter: Payer: Self-pay | Admitting: *Deleted

## 2012-12-20 ENCOUNTER — Telehealth: Payer: Self-pay | Admitting: Internal Medicine

## 2012-12-20 NOTE — Telephone Encounter (Signed)
Pt stated she received her labs results and wanted to know if she needs to still come tomorrow for her appointment

## 2012-12-20 NOTE — Telephone Encounter (Signed)
Pt states she has not heard back regarding whether or not she needs to keep her appt with Dr. Darrick Huntsman tomorrow.  Please advise pt if she needs to keep or cancel this appt.

## 2012-12-21 ENCOUNTER — Ambulatory Visit: Payer: Medicare Other | Admitting: Internal Medicine

## 2012-12-21 ENCOUNTER — Encounter: Payer: Self-pay | Admitting: Internal Medicine

## 2012-12-21 NOTE — Telephone Encounter (Signed)
Patient cancelled

## 2013-01-13 ENCOUNTER — Encounter: Payer: Self-pay | Admitting: Internal Medicine

## 2013-02-23 ENCOUNTER — Ambulatory Visit: Payer: Medicare Other | Admitting: Internal Medicine

## 2013-07-08 ENCOUNTER — Telehealth: Payer: Self-pay | Admitting: Internal Medicine

## 2013-07-08 NOTE — Telephone Encounter (Signed)
Pt has scheduled an appt with Raquel & will discuss referral at that time

## 2013-07-08 NOTE — Telephone Encounter (Signed)
Pt left message wanting to get a referral for gyn

## 2013-07-11 ENCOUNTER — Ambulatory Visit (INDEPENDENT_AMBULATORY_CARE_PROVIDER_SITE_OTHER): Payer: Medicare Other | Admitting: Adult Health

## 2013-07-11 ENCOUNTER — Encounter: Payer: Self-pay | Admitting: Adult Health

## 2013-07-11 ENCOUNTER — Encounter (INDEPENDENT_AMBULATORY_CARE_PROVIDER_SITE_OTHER): Payer: Self-pay

## 2013-07-11 VITALS — BP 120/78 | HR 62 | Temp 97.6°F | Resp 12 | Wt 120.0 lb

## 2013-07-11 DIAGNOSIS — N819 Female genital prolapse, unspecified: Secondary | ICD-10-CM

## 2013-07-11 MED ORDER — IPRATROPIUM BROMIDE 0.03 % NA SOLN: 2.0000 | Freq: Two times a day (BID) | NASAL | Status: DC

## 2013-07-11 NOTE — Progress Notes (Signed)
Pre visit review using our clinic review tool, if applicable. No additional management support is needed unless otherwise documented below in the visit note. 

## 2013-07-11 NOTE — Assessment & Plan Note (Signed)
Exam normal other than large hemorrhoid observed. Pt has hx of hysterectomy although she believes they left her uterus (??)  She observed something protruding at the opening of the vagina. ? Cystocele or rectocele. She has appt with GYN on 07/17/12 which she made in advance; however, required referral. Referral made.

## 2013-07-11 NOTE — Progress Notes (Signed)
   Subjective:    Patient ID: Karen Parsons, female    DOB: June 30, 1940, 73 y.o.   MRN: [id number]  HPI Patient is a very pleasant 73 year old female who presents to clinic with concerns of her "uterus coming out of her vagina". She reports feeling a bulge. She then looked at it with a mirror and saw something protruding out of her vagina. She already has an appt to be evaluated by GYN but her insurance requires a referral. So she is here for evaluation. Note, pt has hx of hysterectomy in her 56s. She reports that her surgeon told her he "was leaving the playpen". Not exactly sure what this meant. Pt believes that he removed her ovaries but left the uterus (?)  Current Outpatient Prescriptions on File Prior to Visit  Medication Sig Dispense Refill  . omeprazole (PRILOSEC) 20 MG capsule Take 20 mg by mouth daily.         No current facility-administered medications on file prior to visit.      Review of Systems  Genitourinary: Negative for vaginal bleeding, vaginal discharge, vaginal pain and pelvic pain.       Bulge out of vagina  All other systems reviewed and are negative.       Objective:   Physical Exam  Constitutional: She is oriented to person, place, and time. She appears well-developed and well-nourished. No distress.  Cardiovascular: Normal rate and regular rhythm.   Pulmonary/Chest: Effort normal. No respiratory distress.  Abdominal: Soft. Bowel sounds are normal.  Genitourinary:    There is no rash, tenderness, lesion or injury on the right labia. There is no rash, tenderness, lesion or injury on the left labia. No erythema, tenderness or bleeding around the vagina. No foreign body around the vagina. No signs of injury around the vagina. No vaginal discharge found.  Musculoskeletal: Normal range of motion.  Neurological: She is alert and oriented to person, place, and time.    BP 120/78  Pulse 62  Temp(Src) 97.6 F (36.4 C) (Oral)  Resp 12  Wt 120 lb (54.432 kg)   SpO2 97%        Assessment & Plan:

## 2013-07-11 NOTE — Patient Instructions (Signed)
  I sent in a prescription for atrovent nasal spray - use every 12 hours.  Referral to GYN has been entered.  Please let us know if we may be of further assistance

## 2013-07-13 ENCOUNTER — Encounter: Payer: Self-pay | Admitting: Adult Health

## 2013-07-13 ENCOUNTER — Ambulatory Visit (INDEPENDENT_AMBULATORY_CARE_PROVIDER_SITE_OTHER): Payer: Medicare Other | Admitting: Adult Health

## 2013-07-13 VITALS — BP 100/60 | HR 66 | Temp 98.1°F | Resp 12 | Wt 121.5 lb

## 2013-07-13 DIAGNOSIS — R238 Other skin changes: Secondary | ICD-10-CM | POA: Insufficient documentation

## 2013-07-13 DIAGNOSIS — L989 Disorder of the skin and subcutaneous tissue, unspecified: Secondary | ICD-10-CM

## 2013-07-13 NOTE — Assessment & Plan Note (Signed)
Stop zyrtec and atrovent for few days. Use dove soap. Apply vaseline or aloe lotion to the area. RTC if no improvement.

## 2013-07-13 NOTE — Progress Notes (Signed)
Pre visit review using our clinic review tool, if applicable. No additional management support is needed unless otherwise documented below in the visit note. 

## 2013-07-13 NOTE — Progress Notes (Signed)
   Subjective:    Patient ID: Karen Parsons, female    DOB: [birth year], 73 y.o.   MRN: [id number]  HPI Patient is a pleasant 73 year old female who was seen in clinic a few days ago and she returns today with irritation right underneath her nose. Her nasal passages are not irritated it is the skin right underneath her nose above her lip. She had been complaining of rhinorrhea. She started taking Zyrtec. She has also been using Cerave skin cleanser. She has been blowing her nose secondary to the rhinorrhea. The area is more visually disturbing to the patient than anything else. She was also started on atrovent nasal spray to help with the rhinorrhea.  Current Outpatient Prescriptions on File Prior to Visit  Medication Sig Dispense Refill  . cetirizine (ZYRTEC) 10 MG tablet Take 10 mg by mouth daily.      Marland Kitchen ipratropium (ATROVENT) 0.03 % nasal spray Place 2 sprays into both nostrils every 12 (twelve) hours.  30 mL  2  . omeprazole (PRILOSEC) 20 MG capsule Take 20 mg by mouth daily.         No current facility-administered medications on file prior to visit.   Review of Systems  Skin:       Irritation underneath her nose       Objective:   Physical Exam  Skin:  Skin underneath her nose is red and irritated. Appears chapped.          Assessment & Plan:

## 2013-09-05 ENCOUNTER — Telehealth: Payer: Self-pay | Admitting: Internal Medicine

## 2013-09-05 NOTE — Telephone Encounter (Signed)
FYI- appt scheduled with Raquel for tomorrow

## 2013-09-05 NOTE — Telephone Encounter (Signed)
Patient Information:  Caller Name: Margarette  Phone: [phone]  Patient: Karen Parsons, Karen Parsons  Gender: Female  DOB: 06/21/1941  Age: 73 Years  PCP: Deborra Medina (Adults only)  Office Follow Up:  Does the office need to follow up with this patient?: No  Instructions For The Office: N/A  RN Note:  Will try to get an appt. for this pt. No appts. today and does not want to go to another location. Appt. scheduled for 09/06/13 at 08:15 with Raquel Rey to evaluate ear pain. Will take Tylenol if needed for pain.  Symptoms  Reason For Call & Symptoms: Started with aching and flu-like symptoms on 08/31/13.Went to Dr. on 09/03/13 and diagnosed with UTI. Placed on Sepra DS. Now having a lot of ear pain in the Lf. ear. No drainage.  Reviewed Health History In EMR: Yes  Reviewed Medications In EMR: Yes  Reviewed Allergies In EMR: Yes  Reviewed Surgeries / Procedures: Yes  Date of Onset of Symptoms: 09/03/2013  Guideline(s) Used:  Earache  Disposition Per Guideline:   See Today in Office  Reason For Disposition Reached:   All other earaches (Exceptions: earache lasting < 1 hour, and earache from air travel)  Advice Given:  Call Back If  Earache last more than 1 hour  High fever, severe headache, or stiff neck occurs  You become worse.  Patient Will Follow Care Advice:  YES  Appointment Scheduled:  09/06/2013 08:15:00 Appointment Scheduled Provider:  Charolette Forward

## 2013-09-06 ENCOUNTER — Ambulatory Visit (INDEPENDENT_AMBULATORY_CARE_PROVIDER_SITE_OTHER): Payer: Medicare Other | Admitting: Adult Health

## 2013-09-06 ENCOUNTER — Encounter: Payer: Self-pay | Admitting: Adult Health

## 2013-09-06 VITALS — BP 106/74 | HR 61 | Temp 98.2°F | Resp 12 | Wt 123.0 lb

## 2013-09-06 DIAGNOSIS — Z79899 Other long term (current) drug therapy: Secondary | ICD-10-CM

## 2013-09-06 LAB — BASIC METABOLIC PANEL
BUN: 16 mg/dL (ref 6–23)
CHLORIDE: 104 meq/L (ref 96–112)
CO2: 26 meq/L (ref 19–32)
CREATININE: 1.1 mg/dL (ref 0.4–1.2)
Calcium: 9.2 mg/dL (ref 8.4–10.5)
GFR: 52.62 mL/min — ABNORMAL LOW (ref >60.00)
Glucose, Bld: 95 mg/dL (ref 70–99)
Potassium: 3.9 mEq/L (ref 3.5–5.1)
Sodium: 139 mEq/L (ref 135–145)

## 2013-09-06 NOTE — Progress Notes (Signed)
Pre visit review using our clinic review tool, if applicable. No additional management support is needed unless otherwise documented below in the visit note. 

## 2013-09-06 NOTE — Progress Notes (Signed)
Patient ID: Karen Parsons, female   DOB: Oct 31, 1940, 73 y.o.   MRN: [id number]    Subjective:    Patient ID: Karen Parsons, female    DOB: September 09, 1940, 73 y.o.   MRN: [id number]  HPI  Pt is a 73 y/o female who presents to clinic with report that she was having left ear pain. On Sunday she went to urgent care at [facility] because she was not feeling well. 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 She had body aches and pain. She had low back pain. At this time her ear was not bothering her yet. She took NyQuil but on Sunday did not feel any better which is when she went to the urgent care. She was swabed for the flu which was negative. They found that she had a UTI and treated her with Bactrim x 7 days. She then started to have shooting ear pain in the left ear on Monday. She reports the pain improved yesterday. No report of hearing loss. She is here for a follow up since she is getting ready to leave to [place] on Tuesday.   Past Medical History  Diagnosis Date  . Hyperlipidemia     intolerant of statins, normal diagnostic cath July 2010 Nehemiah Massed)  . GERD (gastroesophageal reflux disease)   . Osteoporosis     by DEXA 2008  . S/P colonoscopy 2011    [name], polyp removed  . Barrett's esophagus July 2010    Wohl,     Current Outpatient Prescriptions on File Prior to Visit  Medication Sig Dispense Refill  . cetirizine (ZYRTEC) 10 MG tablet Take 10 mg by mouth daily.      Marland Kitchen ipratropium (ATROVENT) 0.03 % nasal spray Place 2 sprays into both nostrils every 12 (twelve) hours.  30 mL  2  . omeprazole (PRILOSEC) 20 MG capsule Take 20 mg by mouth daily.         No current facility-administered medications on file prior to visit.     Review of Systems  Constitutional: Positive for fatigue (feels a little "shaky").  HENT: Positive for ear pain (improved since yesterday).   Respiratory: Negative.   Cardiovascular: Negative.   Gastrointestinal:  Negative for nausea, vomiting and diarrhea.  Genitourinary: Negative.  Negative for dysuria, frequency, hematuria, flank pain, difficulty urinating and pelvic pain.  Musculoskeletal: Positive for back pain (improved since starting antibiotic).  Neurological: Negative.  Negative for weakness.  Psychiatric/Behavioral: Negative.   All other systems reviewed and are negative.       Objective:  BP 106/74  Pulse 61  Temp(Src) 98.2 F (36.8 C) (Oral)  Resp 12  Wt 123 lb (55.792 kg)  SpO2 95%   Physical Exam  Constitutional: She is oriented to person, place, and time. No distress.  HENT:  Head: Normocephalic and atraumatic.  Right Ear: External ear normal.  Left Ear: External ear normal.  Cardiovascular: Normal rate and regular rhythm.   Pulmonary/Chest: Effort normal. No respiratory distress.  Lymphadenopathy:    She has no cervical adenopathy.  Neurological: She is alert and oriented to person, place, and time.  Psychiatric: She has a normal mood and affect. Her behavior is normal. Judgment and thought content normal.       Assessment & Plan:   1. Medication management Pt started on bactrim on Sunday. Symptoms overall improving. She is feeling a little "shaky". Check metabolic panel. - Basic metabolic panel

## 2013-09-08 ENCOUNTER — Telehealth: Payer: Self-pay | Admitting: Internal Medicine

## 2013-09-08 NOTE — Telephone Encounter (Signed)
Patient Information:  Caller Name: Randell  Phone: [phone]  Patient: Karen Parsons, Karen Parsons  Gender: Female  DOB: 12-Jun-1941  Age: 73 Years  PCP: Deborra Medina (Adults only)  Office Follow Up:  Does the office need to follow up with this patient?: No  Instructions For The Office: N/A  RN Note:  Patient seen in follow up for UTI.  States she has new onset headache, left jaw pain, and mild wheezing.  Is being treated for UTI with Bactrim.  Onset 09/06/13.  States she is "fine until she lies down to go to bed, and then the headache hits."  Per headache protocol, emergent symptoms denied; advised appt within 24 hours.  No appts available 09/08/13 in office; refuses appt elsewhere.  States will go to [facility].  krs/can  Symptoms  Reason For Call & Symptoms: follow up UTI  Reviewed Health History In EMR: Yes  Reviewed Medications In EMR: Yes  Reviewed Allergies In EMR: Yes  Reviewed Surgeries / Procedures: Yes  Date of Onset of Symptoms: 09/06/2013  Guideline(s) Used:  Headache  Disposition Per Guideline:   See Today or Tomorrow in Office  Reason For Disposition Reached:   Unexplained headache that is present > 24 hours  Advice Given:  N/A  Patient Refused Recommendation:  Patient Will Go To [facility]  Patient refuses appt elsewhere krs/can

## 2013-09-08 NOTE — Telephone Encounter (Signed)
FYI-pt to Urgent Care for headache

## 2013-09-12 ENCOUNTER — Encounter: Payer: Self-pay | Admitting: Adult Health

## 2013-10-02 ENCOUNTER — Other Ambulatory Visit: Payer: Self-pay | Admitting: Adult Health

## 2013-10-02 HISTORY — PX: HEMORRHOID SURGERY: SHX153

## 2013-11-24 ENCOUNTER — Other Ambulatory Visit: Payer: Self-pay | Admitting: Adult Health

## 2013-12-12 ENCOUNTER — Ambulatory Visit: Payer: Self-pay | Admitting: General Practice

## 2013-12-21 ENCOUNTER — Ambulatory Visit: Payer: Self-pay | Admitting: General Practice

## 2013-12-27 ENCOUNTER — Ambulatory Visit: Payer: Self-pay | Admitting: General Practice

## 2014-04-17 ENCOUNTER — Ambulatory Visit (INDEPENDENT_AMBULATORY_CARE_PROVIDER_SITE_OTHER): Payer: Medicare Other | Admitting: Internal Medicine

## 2014-04-17 ENCOUNTER — Encounter: Payer: Self-pay | Admitting: Internal Medicine

## 2014-04-17 VITALS — BP 140/88 | HR 64 | Temp 97.5°F | Resp 16 | Ht 62.5 in | Wt 119.5 lb

## 2014-04-17 DIAGNOSIS — R5383 Other fatigue: Secondary | ICD-10-CM

## 2014-04-17 DIAGNOSIS — M25461 Effusion, right knee: Secondary | ICD-10-CM

## 2014-04-17 DIAGNOSIS — Z1239 Encounter for other screening for malignant neoplasm of breast: Secondary | ICD-10-CM

## 2014-04-17 DIAGNOSIS — E785 Hyperlipidemia, unspecified: Secondary | ICD-10-CM

## 2014-04-17 DIAGNOSIS — Z23 Encounter for immunization: Secondary | ICD-10-CM

## 2014-04-17 DIAGNOSIS — E559 Vitamin D deficiency, unspecified: Secondary | ICD-10-CM

## 2014-04-17 DIAGNOSIS — Z0000 Encounter for general adult medical examination without abnormal findings: Secondary | ICD-10-CM

## 2014-04-17 NOTE — Progress Notes (Signed)
Patient ID: Karen Parsons, female   DOB: 10/25/1940, 73 y.o.   MRN: [id number]  The patient is here for annual Medicare wellness examination and management of other chronic and acute problems.  Left ear pain .  No history of viral syndrome, pain was sudden and severe, resolved spontaneously.   Right knee pain  History of torn  meniscus s/p repair and debridement by [facility] in July ,  Followed by PT.  cortisone shot in September for persistent pain and swelling,  Oct 5th started doing PT  In early October, and on the 4th visit she was told to do squats and lateral walking and felt a tear.   Worked in for repeat ortho eval ,  All activity stopped , ice elevation and rest prescribed for 6 weeks,  2 weeks into it . STill has pain when using the stairs.  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  The roster of all physicians providing medical care to patient - is listed in the Snapshot section of the chart.  Activities of daily living:  The patient is 100% independent in all ADLs: dressing, toileting, feeding as well as independent mobility  Home safety : The patient has smoke detectors in the home. They wear seatbelts.  There are no firearms at home. There is no violence in the home.   There is no risks for hepatitis, STDs or HIV. There is no   history of blood transfusion. They have no travel history to infectious disease endemic areas of the world.  The patient has seen their dentist in the last six month. They have seen their eye doctor in the last year. They admit to slight hearing difficulty with regard to whispered voices and some television programs.  They have deferred audiologic testing in the last year.  They do not  have excessive sun exposure. Discussed the need for sun protection: hats, long sleeves and use of sunscreen if there is significant sun exposure.   Diet: the  importance of a healthy diet is discussed. They do have a healthy diet.  The benefits of regular aerobic exercise were discussed. She walks 4 times per week ,  20 minutes.   Depression screen: there are no signs or vegative symptoms of depression- irritability, change in appetite, anhedonia, sadness/tearfullness.  Cognitive assessment: the patient manages all their financial and personal affairs and is actively engaged. They could relate day,date,year and events; recalled 2/3 objects at 3 minutes; performed clock-face test normally.  The following portions of the patient's history were reviewed and updated as appropriate: allergies, current medications, past family history, past medical history,  past surgical history, past social history  and problem list.  Visual acuity was not assessed per patient preference since she has regular follow up with her ophthalmologist. Hearing and body mass index were assessed and reviewed.   During the course of the visit the patient was educated and counseled about appropriate screening and preventive services including : fall prevention , diabetes screening, nutrition counseling, colorectal cancer screening, and recommended immunizations.    Objective:  BP 140/88  Pulse 64  Temp(Src) 97.5 F (36.4 C) (Oral)  Resp 16  Ht 5' 2.5" (1.588 m)  Wt 119 lb 8 oz (54.205 kg)  BMI 21.50 kg/m2  SpO2 98%  General appearance: alert, cooperative and appears stated age Head: Normocephalic, without obvious abnormality, atraumatic Eyes: conjunctivae/corneas  clear. PERRL, EOM's intact. Fundi benign. Ears: normal TM's and external ear canals both ears Nose: Nares normal. Septum midline. Mucosa normal. No drainage or sinus tenderness. Throat: lips, mucosa, and tongue normal; teeth and gums normal Neck: no adenopathy, no carotid bruit, no JVD, supple, symmetrical, trachea midline and thyroid not enlarged, symmetric, no tenderness/mass/nodules Lungs: clear to  auscultation bilaterally Breasts: normal appearance, no masses or tenderness Heart: regular rate and rhythm, S1, S2 normal, no murmur, click, rub or gallop Abdomen: soft, non-tender; bowel sounds normal; no masses,  no organomegaly Extremities: extremities normal, atraumatic, no cyanosis or edema Pulses: 2+ and symmetric Skin: Skin color, texture, turgor normal. No rashes or lesions Neurologic: Alert and oriented X 3, normal strength and tone. Normal symmetric reflexes. Normal coordination and gait.   Assessment and Plan:  Medicare annual wellness visit, subsequent Annual Medicare wellness  exam was done as well as a comprehensive physical exam and management of acute and chronic conditions .  During the course of the visit the patient was educated and counseled about appropriate screening and preventive services including : fall prevention , diabetes screening, nutrition counseling, colorectal cancer screening, and recommended immunizations.  Printed recommendations for health maintenance screenings was given.    Updated Medication List Outpatient Encounter Prescriptions as of 04/17/2014  Medication Sig  . ipratropium (ATROVENT) 0.03 % nasal spray PLACE 2 SPRAYS INTO BOTH NOSTRILS EVERY 12 (TWELVE) HOURS.  Marland Kitchen omeprazole (PRILOSEC) 20 MG capsule Take 20 mg by mouth daily.    . cetirizine (ZYRTEC) 10 MG tablet Take 10 mg by mouth daily.  . [DISCONTINUED] ipratropium (ATROVENT) 0.03 % nasal spray PLACE 2 SPRAYS INTO BOTH NOSTRILS EVERY 12 (TWELVE) HOURS.  . [DISCONTINUED] sulfamethoxazole-trimethoprim (BACTRIM DS) 800-160 MG per tablet Take 800 tablets by mouth.

## 2014-04-17 NOTE — Patient Instructions (Signed)
You had your annual  wellness exam today.    You can schedule your mammogram now  You received the new pneumonia vaccine today.     We will contact you with the bloodwork results  I recommend getting the majority of your calcium and Vitamin D  through diet rather than supplements given the recent association of calcium supplements with increased coronary artery calcium scores (You need 1200 mg daily )   Unsweetened almond/coconut milk is a great low calorie low carb, cholesterol free  way to increase your dietary calcium and vitamin D.

## 2014-04-18 ENCOUNTER — Encounter: Payer: Self-pay | Admitting: Internal Medicine

## 2014-04-18 DIAGNOSIS — Z0000 Encounter for general adult medical examination without abnormal findings: Secondary | ICD-10-CM | POA: Insufficient documentation

## 2014-04-18 LAB — CBC WITH DIFFERENTIAL/PLATELET
Basophils Absolute: 0 10*3/uL (ref 0.0–0.1)
Basophils Relative: 0.7 % (ref 0.0–3.0)
Eosinophils Absolute: 0.1 10*3/uL (ref 0.0–0.7)
Eosinophils Relative: 1.6 % (ref 0.0–5.0)
HEMATOCRIT: 42 % (ref 36.0–46.0)
HEMOGLOBIN: 13.6 g/dL (ref 12.0–15.0)
LYMPHS ABS: 1.6 10*3/uL (ref 0.7–4.0)
Lymphocytes Relative: 28.1 % (ref 12.0–46.0)
MCHC: 32.5 g/dL (ref 30.0–36.0)
MCV: 93.6 fl (ref 78.0–100.0)
MONOS PCT: 9.3 % (ref 3.0–12.0)
Monocytes Absolute: 0.5 10*3/uL (ref 0.1–1.0)
NEUTROS ABS: 3.3 10*3/uL (ref 1.4–7.7)
Neutrophils Relative %: 60.3 % (ref 43.0–77.0)
PLATELETS: 257 10*3/uL (ref 150.0–400.0)
RBC: 4.49 Mil/uL (ref 3.87–5.11)
RDW: 14.9 % (ref 11.5–15.5)
WBC: 5.6 10*3/uL (ref 4.0–10.5)

## 2014-04-18 LAB — LIPID PANEL
Cholesterol: 293 mg/dL — ABNORMAL HIGH (ref 0–200)
HDL: 85.3 mg/dL (ref >39.00)
LDL Cholesterol: 197 mg/dL — ABNORMAL HIGH (ref 0–99)
NONHDL: 207.7
Total CHOL/HDL Ratio: 3
Triglycerides: 56 mg/dL (ref 0.0–149.0)
VLDL: 11.2 mg/dL (ref 0.0–40.0)

## 2014-04-18 LAB — COMPREHENSIVE METABOLIC PANEL
ALK PHOS: 65 U/L (ref 39–117)
ALT: 13 U/L (ref 0–35)
AST: 20 U/L (ref 0–37)
Albumin: 3.8 g/dL (ref 3.5–5.2)
BILIRUBIN TOTAL: 0.8 mg/dL (ref 0.2–1.2)
BUN: 23 mg/dL (ref 6–23)
CO2: 24 mEq/L (ref 19–32)
Calcium: 9.5 mg/dL (ref 8.4–10.5)
Chloride: 103 mEq/L (ref 96–112)
Creatinine, Ser: 0.9 mg/dL (ref 0.4–1.2)
GFR: 67.42 mL/min (ref >60.00)
GLUCOSE: 90 mg/dL (ref 70–99)
Potassium: 4.2 mEq/L (ref 3.5–5.1)
Sodium: 140 mEq/L (ref 135–145)
Total Protein: 7.1 g/dL (ref 6.0–8.3)

## 2014-04-18 LAB — VITAMIN D 25 HYDROXY (VIT D DEFICIENCY, FRACTURES): VITD: 24.38 ng/mL — AB (ref 30.00–100.00)

## 2014-04-18 LAB — TSH: TSH: 1.21 u[IU]/mL (ref 0.35–4.50)

## 2014-04-18 NOTE — Assessment & Plan Note (Signed)

## 2014-04-19 MED ORDER — RED YEAST RICE 600 MG PO CAPS: 1.0000 | ORAL_CAPSULE | Freq: Two times a day (BID) | ORAL | Status: DC

## 2014-04-19 MED ORDER — ERGOCALCIFEROL 1.25 MG (50000 UT) PO CAPS: 50000.0000 [IU] | ORAL_CAPSULE | ORAL | Status: DC

## 2014-04-19 NOTE — Addendum Note (Signed)
Addended by: Crecencio Mc on: 04/19/2014 10:03 PM   Modules accepted: Orders

## 2014-04-20 ENCOUNTER — Encounter: Payer: Self-pay | Admitting: *Deleted

## 2014-05-04 NOTE — Telephone Encounter (Signed)
Mailed unread message to pt  

## 2014-05-15 ENCOUNTER — Ambulatory Visit: Payer: Self-pay | Admitting: Internal Medicine

## 2014-05-20 ENCOUNTER — Telehealth: Payer: Self-pay | Admitting: Internal Medicine

## 2014-05-20 NOTE — Telephone Encounter (Signed)
Her mammogram was abnormal on the left.  They saw  Asymmetry  and want additional images, and will be calling her to set those up, .  I prefer to choose the surgeon with the patient if a biopsy is recommended, so if they tell her she needs a biopsy, she can tell them she is going to discuss it with me first.

## 2014-05-21 NOTE — Telephone Encounter (Signed)
Pt notified and  verbalized understanding. As not been contacted yet, advised to call [facility] to discuss.

## 2014-05-21 NOTE — Telephone Encounter (Signed)
Left message for pt to return my call.

## 2014-05-23 ENCOUNTER — Ambulatory Visit: Payer: Self-pay | Admitting: Internal Medicine

## 2014-05-30 ENCOUNTER — Encounter: Payer: Self-pay | Admitting: Internal Medicine

## 2014-05-31 ENCOUNTER — Ambulatory Visit: Payer: Self-pay | Admitting: Nurse Practitioner

## 2014-06-07 ENCOUNTER — Other Ambulatory Visit: Payer: Self-pay | Admitting: Internal Medicine

## 2014-06-07 MED ORDER — IPRATROPIUM BROMIDE 0.03 % NA SOLN: 2.0000 | Freq: Two times a day (BID) | NASAL | Status: DC

## 2014-06-14 ENCOUNTER — Encounter: Payer: Self-pay | Admitting: Internal Medicine

## 2014-06-28 ENCOUNTER — Encounter: Payer: Self-pay | Admitting: *Deleted

## 2014-06-28 NOTE — Telephone Encounter (Signed)
From: Amarachukwu Lakatos Sent: 05/07/2014 4:44 PM EST To: Aviva Kluver Subject: RE:Flu Shot Clinic Saturday March 17, 2014  I already had my flu shot ----- Message ----- From: Genia Plants Sent: 03/10/2014 6:37 PM EDT To: Meryl Crutch Subject: Flu Shot Clinic Saturday March 17, 2014  [facility] will hold a [facility] on Saturday, September 26 from 9 am to noon. In order to plan appropriately, we ask you to please call the office to schedule an appointment time during the clinic. Our schedulers are ready to assist you, Monday through Friday, 8 am to 5 pm at [phone]. Thank you for choosing [facility] for your healthcare needs.

## 2014-08-21 ENCOUNTER — Encounter: Payer: Self-pay | Admitting: Internal Medicine

## 2014-08-21 ENCOUNTER — Ambulatory Visit (INDEPENDENT_AMBULATORY_CARE_PROVIDER_SITE_OTHER): Payer: Medicare Other | Admitting: Internal Medicine

## 2014-08-21 VITALS — BP 118/70 | HR 80 | Temp 98.8°F | Resp 16 | Ht 63.0 in | Wt 119.2 lb

## 2014-08-21 DIAGNOSIS — J111 Influenza due to unidentified influenza virus with other respiratory manifestations: Secondary | ICD-10-CM

## 2014-08-21 DIAGNOSIS — M79605 Pain in left leg: Secondary | ICD-10-CM

## 2014-08-21 DIAGNOSIS — K2270 Barrett's esophagus without dysplasia: Secondary | ICD-10-CM

## 2014-08-21 DIAGNOSIS — J029 Acute pharyngitis, unspecified: Secondary | ICD-10-CM

## 2014-08-21 DIAGNOSIS — J1189 Influenza due to unidentified influenza virus with other manifestations: Secondary | ICD-10-CM

## 2014-08-21 LAB — POC INFLUENZA A&B (BINAX/QUICKVUE)
INFLUENZA A, POC: NEGATIVE
INFLUENZA B, POC: NEGATIVE

## 2014-08-21 MED ORDER — PROMETHAZINE HCL 25 MG RE SUPP: 25.0000 mg | Freq: Four times a day (QID) | RECTAL | Status: DC | PRN

## 2014-08-21 MED ORDER — OSELTAMIVIR PHOSPHATE 75 MG PO CAPS: 75.0000 mg | ORAL_CAPSULE | Freq: Two times a day (BID) | ORAL | Status: DC

## 2014-08-21 NOTE — Progress Notes (Signed)
Pre-visit discussion using our clinic review tool. No additional management support is needed unless otherwise documented below in the visit note.  

## 2014-08-21 NOTE — Patient Instructions (Addendum)
.  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

## 2014-08-21 NOTE — Progress Notes (Signed)
Patient ID: Karen Parsons, female   DOB: Oct 22, 1940, 74 y.o.   MRN: [id number]  Patient Active Problem List   Diagnosis Date Noted  . Influenza with respiratory manifestations 08/22/2014  . Leg pain, left 08/22/2014  . Medicare annual wellness visit, subsequent 04/18/2014  . Prolapse of female pelvic organs 07/11/2013  . Screening for breast cancer 10/29/2012  . Tubular adenoma of colon 10/29/2012  . Hyperlipidemia   . GERD (gastroesophageal reflux disease)   . S/P colonoscopy   . Barrett's esophagus     Subjective:  CC:   Chief Complaint  Patient presents with  . Leg Pain    Right leg pain radiating from hip down leg.cannot put weight on right leg.  . Sore Throat    hoarse, scratchy, last 24 hours. difficulty swallowing lying down X 1 week  . Cough    chills body aches.    HPI: Of persistent Diasia Karen Parsons is a 74 y.o. female who presents for evaluation of persistent leg pain .  Woke up this morning  With sore throat,  Aching all over "like a Mack truck hit me" accompanied by chills and skin hurting  Flu vaccine received back in October at [place] .  Lots of sick contacts.   orignial cc:  persistent left knee pain despite surgery and a cortisone shot .  Was sent by Ortho to [facility] after MRI of lumbar spine showed spinal stenosis  At multiple levels.  The steroid shot on Dec 31helped for a while,  Then hip started really hurting  Both hip joints looked fine,  Got a cortisone shot for bursitis Jan 28th .    Recently turned a funny way and had pain that shot all th way down ot the ankle on the right side .  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 Hyperlipidemia     intolerant of statins, normal diagnostic cath July 2010 Nehemiah Massed)  . GERD (gastroesophageal reflux  disease)   . Osteoporosis     by DEXA 2008  . S/P colonoscopy 2011    Karen Parsons, polyp removed  . Barrett's esophagus July 2010    Karen Parsons,     Past Surgical History  Procedure Laterality Date  . Abdominal hysterectomy      age 73, menorrhagia  . Cardiac catheterization Left July 2010    [name]:  normal  . Medial meniscotomy Left 2007    [name]  . Osteotomy toes Right 2008    Karen Parsons  . Cataract extraction Left 2010    Karen Parsons       The following portions of the patient's history were reviewed and updated as appropriate: Allergies, current medications, and problem list.    Review of Systems:   Patient denies headache, fevers, malaise, unintentional weight loss, skin rash, eye pain, sinus congestion and sinus pain, sore throat, dysphagia,  hemoptysis , cough, dyspnea, wheezing, chest pain, palpitations, orthopnea, edema, abdominal pain, nausea, melena, diarrhea, constipation, flank pain, dysuria, hematuria, urinary  Frequency, nocturia, numbness, tingling, seizures,  Focal weakness, Loss of consciousness,  Tremor, insomnia, depression, anxiety, and suicidal ideation.     History   Social History  . Marital Status: Widowed    Spouse Name: Karen Parsons  .  Number of Children: Karen Parsons  . Years of Education: Karen Parsons   Occupational History  . Not on file.   Social History Main Topics  . Smoking status: Former Smoker    Quit date: 06/22/1968  . Smokeless tobacco: Never Used  . Alcohol Use: 1.5 oz/week    3 drink(s) per week     Comment: wine x1 a night  . Drug Use: No  . Sexual Activity: Not on file   Other Topics Concern  . Not on file   Social History Narrative    Objective:  Filed Vitals:   08/21/14 1402  BP: 118/70  Pulse: 80  Temp: 98.8 F (37.1 C)  Resp: 16     General appearance: alert, cooperative and appears stated age Ears: normal TM's and external ear canals both ears Throat: lips, mucosa, and tongue normal; teeth and gums normal Neck: no  adenopathy, no carotid bruit, supple, symmetrical, trachea midline and thyroid not enlarged, symmetric, no tenderness/mass/nodules Back: symmetric, no curvature. ROM normal. No CVA tenderness. Lungs: clear to auscultation bilaterally Heart: regular rate and rhythm, S1, S2 normal, no murmur, click, rub or gallop Abdomen: soft, non-tender; bowel sounds normal; no masses,  no organomegaly Pulses: 2+ and symmetric Skin: Skin color, texture, turgor normal. No rashes or lesions Lymph nodes: Cervical, supraclavicular, and axillary nodes normal.  Assessment and Plan:  Influenza with respiratory manifestations Treating with tamiflu based on symptoms.    Barrett's esophagus Currently managed with PPI, but she is concerned about the recently published studies suggesting an association with increased risk of dementia .  Advised to continue PPI  Indefinitely due to BE     Leg pain, left She has had imaging and orthopedic evaluations which failed to find a source for her pain .  PT evaluation advised.     Updated Medication List Outpatient Encounter Prescriptions as of 08/21/2014  Medication Sig  . ergocalciferol (DRISDOL) 50000 UNITS capsule Take 1 capsule (50,000 Units total) by mouth once a week.  Marland Kitchen ipratropium (ATROVENT) 0.03 % nasal spray Place 2 sprays into both nostrils 2 (two) times daily.  Marland Kitchen omeprazole (PRILOSEC) 20 MG capsule Take 20 mg by mouth daily.    . cetirizine (ZYRTEC) 10 MG tablet Take 10 mg by mouth daily.  Marland Kitchen oseltamivir (TAMIFLU) 75 MG capsule Take 1 capsule (75 mg total) by mouth 2 (two) times daily.  . promethazine (PROMETHEGAN) 25 MG suppository Place 1 suppository (25 mg total) rectally every 6 (six) hours as needed for nausea or vomiting.  . Red Yeast Rice 600 MG CAPS Take 1 capsule (600 mg total) by mouth 2 (two) times daily. (Patient not taking: Reported on 08/21/2014)     Orders Placed This Encounter  Procedures  . POC Influenza A&B (Binax test)    No Follow-up on  file.

## 2014-08-22 DIAGNOSIS — M79605 Pain in left leg: Secondary | ICD-10-CM | POA: Insufficient documentation

## 2014-08-22 NOTE — Assessment & Plan Note (Signed)
Treating with tamiflu based on symptoms.

## 2014-08-22 NOTE — Assessment & Plan Note (Signed)
She has had imaging and orthopedic evaluations which failed to find a source for her pain .  PT evaluation advised.

## 2014-08-22 NOTE — Assessment & Plan Note (Signed)
Currently managed with PPI, but she is concerned about the recently published studies suggesting an association with increased risk of dementia .  Advised to continue PPI  Indefinitely due to BE

## 2014-08-24 ENCOUNTER — Telehealth: Payer: Self-pay | Admitting: *Deleted

## 2014-08-24 NOTE — Telephone Encounter (Signed)
Spoke with pt advised of MDs message.  Pt verbalized understanding. 

## 2014-08-24 NOTE — Telephone Encounter (Signed)
It would really be helpful  If you would ask her what symptoms she is currently having please.:  Headache?  Sinus congestion? body aches? Cough?  Sore throat?

## 2014-08-24 NOTE — Telephone Encounter (Signed)
Pt called states Tamiflu was too expensive, she purchased Mucinex DM instead.  States Mucinex is making her feel "like she is not in her body".  Please advise

## 2014-08-24 NOTE — Telephone Encounter (Signed)
She can take OTC Delsym for the cough , , motrin and tylenol for any achiness,

## 2014-08-24 NOTE — Telephone Encounter (Signed)
Spoke with pt, she states she has a non productive cough.  She states she just does not feel good.

## 2014-10-09 NOTE — Op Note (Signed)
PATIENT NAME:  Karen Parsons, Karen Parsons MR#:  [id number] DATE OF BIRTH:  07-29-1940  DATE OF PROCEDURE:  02/29/2012  PREOPERATIVE DIAGNOSIS:  Cataract, left eye.    POSTOPERATIVE DIAGNOSIS:  Cataract, left eye.  PROCEDURE PERFORMED:  Extracapsular cataract extraction using phacoemulsification with placement of an Alcon SN6CWS, 24.0-diopter posterior chamber lens, serial # [id number].  SURGEON:  Loura Back. Nedra Mcinnis, MD  ASSISTANT:  None.  ANESTHESIA:  4% lidocaine and 0.75% Marcaine in a 50/50 mixture with 10 units/mL of Hylenex added, given as a peribulbar.  ANESTHESIOLOGIST:  Dr. Benjamine Mola  COMPLICATIONS:  None.  ESTIMATED BLOOD LOSS:  Less than 1 mL   DESCRIPTION OF PROCEDURE:  The patient was brought to the operating room and given a peribulbar block.  The patient was then prepped and draped in the usual fashion.  The vertical rectus muscles were imbricated using 5-0 silk sutures.  These sutures were then clamped to the sterile drapes as bridle sutures.  A limbal peritomy was performed extending two clock hours and hemostasis was obtained with cautery.  A partial thickness scleral groove was made at the surgical limbus and dissected anteriorly in a lamellar dissection using an Alcon crescent knife.  The anterior chamber was entered supero-temporally with a Superblade and through the lamellar dissection with a 2.6 mm keratome.  DisCoVisc was used to replace the aqueous and a continuous tear capsulorrhexis was carried out.  Hydrodissection and hydrodelineation were carried out with balanced salt and a 27 gauge canula.  The nucleus was rotated to confirm the effectiveness of the hydrodissection.  Phacoemulsification was carried out using a divide-and-conquer technique.  Total ultrasound time was 1 minute and 9 seconds with an average power of 14.5 percent. CDE 17.57.  Irrigation/aspiration was used to remove the residual cortex.  DisCoVisc was used to inflate the capsule and the internal incision was  enlarged to 3 mm with the crescent knife.  The intraocular lens was folded and inserted into the capsular bag using the AcrySert delivery system.  Irrigation/aspiration was used to remove the residual DisCoVisc.  Miostat was injected into the anterior chamber through the paracentesis track to inflate the anterior chamber and induce miosis.  The wound was checked for leaks and none were found. The conjunctiva was closed with cautery and the bridle sutures were removed.  Two drops of 0.3% Vigamox were placed on the eye.   An eye shield was placed on the eye.  The patient was discharged to the recovery room in good condition.  ____________________________ Loura Back Beverlee Wilmarth, MD sad:cms D: 02/29/2012 13:34:05 ET T: 02/29/2012 14:06:37 ET JOB#: [id number]  cc: Remo Lipps A. Taino Maertens, MD, <Dictator>  Martie Lee MD ELECTRONICALLY SIGNED 03/21/2012 12:33

## 2014-10-13 NOTE — Op Note (Signed)
PATIENT NAME:  Karen Parsons, Karen Parsons MR#:  [id number] DATE OF BIRTH:  [birth year]  DATE OF PROCEDURE:  12/27/2013  PREOPERATIVE DIAGNOSIS: Internal derangement of the right knee.   POSTOPERATIVE DIAGNOSES: 1.  Tear of the anterior horn, lateral meniscus, right knee; with extension to the posterior horn.  2.  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   PROCEDURES PERFORMED: Right knee arthroscopy, partial lateral meniscectomy and chondroplasty of the lateral femoral condyle.   SURGEON: Laurice Record. Holley Bouche, M.D.   ANESTHESIA: General.   ESTIMATED BLOOD LOSS: Minimal.   FLUIDS REPLACED: 1000 mL of crystalloid.   TOURNIQUET: Not used.   DRAINS: None.   INDICATIONS FOR SURGERY: The patient is a 74 year old female who has been seen for complaints of right knee pain and swelling. MRI demonstrated findings consistent with meniscal pathology. After discussion of the risks and benefits of surgical intervention, the patient expressed understanding of the risks, benefits and agreed with plans for surgical intervention.   PROCEDURE IN DETAIL: The patient was brought into the Operating Room and, after adequate general anesthesia was achieved, a tourniquet was placed on the patient's right thigh and leg was placed in a leg holder. All bony prominences were well padded. The patient's right knee and leg were cleaned and prepped with alcohol and DuraPrep, draped in the usual sterile fashion. A "timeout" was performed as per usual protocol. The anticipated portal sites were injected with 0.25% Marcaine with epinephrine. An anterolateral portal was created and a cannula was inserted. A large effusion was evacuated. The scope was inserted and the knee was distended with fluid using the pump.  Scope was advanced down the medial gutter and into the medial compartment of the knee. Under visualization with the scope, an anteromedial portal was created and hook probe was inserted. Inspection of the medial  compartment showed the articular surface to be in good condition. The posterior horn was visualized and probed, and felt to be stable. Some erythema was noted to the anterior, horn but the anterior horn was felt to be stable. The scope was then advanced into the intracondylar region. The anterior cruciate ligament was visualized, probed and felt to be stable. The scope was removed from the anterolateral portal and reinserted via the anteromedial portal so as to better visualize the lateral compartment. There was a large complex tear of the anterior horn, which was debrided using the 4.5 mm incisor shaver. This continued to horizontal cleavage tear that extended back to the posterior aspect of the lateral meniscus. The tear was debrided using meniscal punches and a 4.5 mm shaver. Contouring was performed using the 50-degree ArthroCare wand. Inspection of the articular surface demonstrated good coverage to the lateral tibial plateau. There was an area along the posterior lateral aspect of the lateral femoral condyle with grade 3 chondromalacia. This area was debrided and contoured using the ArthroCare wand. Multiple chondral loose bodies were encountered and these were removed using a combination of the incisor shaver and pituitary forceps. Finally, the scope was positioned so as to visualize the patellofemoral articulation. Articular surface was in good condition. Good patellar tracking was noted.   The knee was irrigated with copious amounts of fluid and then suctioned dry. The anterolateral portal was reapproximated using #3-0 nylon. A combination of 0.25% Marcaine with epinephrine and 4 mg of morphine was injected via the scope. The scope was removed and the anteromedial portal was reapproximated using #3-0 nylon. Sterile dressing was applied, followed by application of ice wrap.  The patient tolerated the procedure well. She was transported to the recovery room in stable condition.     ____________________________ Laurice Record. Holley Bouche., MD jph:aj D: 12/27/2013 13:20:44 ET T: 12/28/2013 00:20:10 ET JOB#: [id number]  cc: Laurice Record. Holley Bouche., MD, <Dictator> Laurice Record Holley Bouche MD ELECTRONICALLY SIGNED 01/09/2014 19:39

## 2014-11-07 ENCOUNTER — Ambulatory Visit (INDEPENDENT_AMBULATORY_CARE_PROVIDER_SITE_OTHER): Payer: Medicare Other | Admitting: Internal Medicine

## 2014-11-07 ENCOUNTER — Encounter: Payer: Self-pay | Admitting: Internal Medicine

## 2014-11-07 VITALS — BP 124/84 | HR 65 | Temp 97.9°F | Resp 14 | Ht 63.0 in | Wt 117.0 lb

## 2014-11-07 DIAGNOSIS — R636 Underweight: Secondary | ICD-10-CM

## 2014-11-07 DIAGNOSIS — R252 Cramp and spasm: Secondary | ICD-10-CM | POA: Diagnosis not present

## 2014-11-07 DIAGNOSIS — K219 Gastro-esophageal reflux disease without esophagitis: Secondary | ICD-10-CM | POA: Diagnosis not present

## 2014-11-07 DIAGNOSIS — R29898 Other symptoms and signs involving the musculoskeletal system: Secondary | ICD-10-CM

## 2014-11-07 NOTE — Progress Notes (Signed)
Pre-visit discussion using our clinic review tool. No additional management support is needed unless otherwise documented below in the visit note.  

## 2014-11-07 NOTE — Patient Instructions (Addendum)
You can try turmeric  For muscle cramps  Quinine is found in tonic  Water ,  Just drink an ounce or two in the evening,  Check electrolytes today   The aleve can caused gastritis if you take it every  Day  You should take a Beano with any meal containing greens or beans    Try Premier Protein shakes  Chocolate at [employer]

## 2014-11-07 NOTE — Progress Notes (Signed)
Subjective:  Patient ID: Karen Parsons, female    DOB: [birth year]  Age: 74 y.o. MRN: [id number]  CC: The primary encounter diagnosis was Muscle cramps at night. Diagnoses of Underweight, Gastroesophageal reflux disease without esophagitis, and Complaints of leg weakness were also pertinent to this visit.  foot cramps.  Swollen finger and recurrence of gastritis  Since she started takingaleve 3 times per week     HPI Karen Parsons presents for evaluation of multiple complaints. 1) she has been having foot cramps that occur in the evenings while at rest.  They do not occur with exercise.  They are mild to moderate and last less than 15 minutes.  2) she has a swollen knuckle that has occurred without any trauma, unusual activity or pain/redess suggestive of gout.  Has been taking Aleve every other day,  Some indigestion noted since she started taking it.   3) continued perceived weakness of left leg after knee surgery. Patient is able to ascend and descend stairs without instability or assistance,  Walks without a limp,  No jee effusion,  No falls in the last year. Never had PT.  Still remembers the PT exercises she was given to do after her knee surgery.   Outpatient Prescriptions Prior to Visit  Medication Sig Dispense Refill  . ipratropium (ATROVENT) 0.03 % nasal spray Place 2 sprays into both nostrils 2 (two) times daily. 30 mL 2  . omeprazole (PRILOSEC) 20 MG capsule Take 20 mg by mouth daily.      . cetirizine (ZYRTEC) 10 MG tablet Take 10 mg by mouth daily.    . ergocalciferol (DRISDOL) 50000 UNITS capsule Take 1 capsule (50,000 Units total) by mouth once a week. (Patient not taking: Reported on 11/07/2014) 4 capsule 0  . oseltamivir (TAMIFLU) 75 MG capsule Take 1 capsule (75 mg total) by mouth 2 (two) times daily. (Patient not taking: Reported on 11/07/2014) 10 capsule 0  . promethazine (PROMETHEGAN) 25 MG suppository Place 1 suppository (25 mg total) rectally every 6 (six) hours as needed  for nausea or vomiting. (Patient not taking: Reported on 11/07/2014) 12 each 0  . Red Yeast Rice 600 MG CAPS Take 1 capsule (600 mg total) by mouth 2 (two) times daily. (Patient not taking: Reported on 08/21/2014) 180 capsule 1   No facility-administered medications prior to visit.     Review of Systems   Patient denies headache, fevers, malaise, unintentional weight loss, skin rash, eye pain, sinus congestion and sinus pain, sore throat, dysphagia,  hemoptysis , cough, dyspnea, wheezing, chest pain, palpitations, orthopnea, edema,  melena, diarrhea, constipation, flank pain, dysuria, hematuria, urinary  Frequency, nocturia, numbness, tingling, seizures,  , Loss of consciousness,  Tremor, insomnia, depression, anxiety, and suicidal ideation.      Objective:  BP 124/84 mmHg  Pulse 65  Temp(Src) 97.9 F (36.6 C) (Oral)  Resp 14  Ht 5\' 3"  (1.6 m)  Wt 117 lb (53.071 kg)  BMI 20.73 kg/m2  SpO2 98%  BP Readings from Last 3 Encounters:  11/07/14 124/84  08/21/14 118/70  04/17/14 140/88    Wt Readings from Last 3 Encounters:  11/07/14 117 lb (53.071 kg)  08/21/14 119 lb 4 oz (54.091 kg)  04/17/14 119 lb 8 oz (54.205 kg)    Physical Exam  General appearance: alert, cooperative and appears stated age Ears: normal TM's and external ear canals both ears Throat: lips, mucosa, and tongue normal; teeth and gums normal Neck: no adenopathy, no carotid bruit, supple, symmetrical, trachea  midline and thyroid not enlarged, symmetric, no tenderness/mass/nodules Back: symmetric, no curvature. ROM normal. No CVA tenderness. Lungs: clear to auscultation bilaterally Heart: regular rate and rhythm, S1, S2 normal, no murmur, click, rub or gallop Abdomen: soft, non-tender; bowel sounds normal; no masses,  no organomegaly Pulses: 2+ and symmetric Skin: Skin color, texture, turgor normal. No rashes or lesions Lymph nodes: Cervical, supraclavicular, and axillary nodes normal. MSK: normal knee exam.  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   No results found for: HGBA1C  Lab Results  Component Value Date   CREATININE 0.83 11/07/2014   CREATININE 0.9 04/17/2014   CREATININE 1.1 09/06/2013    Lab Results  Component Value Date   WBC 5.6 04/17/2014   HGB 13.6 04/17/2014   HCT 42.0 04/17/2014   PLT 257.0 04/17/2014   GLUCOSE 92 11/07/2014   CHOL 293* 04/17/2014   TRIG 56.0 04/17/2014   HDL 85.30 04/17/2014   LDLCALC 197* 04/17/2014   ALT 16 11/07/2014   AST 22 11/07/2014   NA 138 11/07/2014   K 4.0 11/07/2014   CL 102 11/07/2014   CREATININE 0.83 11/07/2014   BUN 22 11/07/2014   CO2 30 11/07/2014   TSH 1.24 11/07/2014    No results found.  Assessment & Plan:   Problem List Items Addressed This Visit    GERD (gastroesophageal reflux disease)    Current untreated symptoms are actually due to gas.  Recommended continuing Prilosec and adding Beano ac bid       Underweight    Diet discussed and recommendations made.       Muscle cramps at night - Primary    Recommend turmeric and quinine. vascular exam is normal.       Relevant Orders   TSH (Completed)   Comprehensive metabolic panel (Completed)   Complaints of leg weakness    Her deficits are very subtle and she is fully functional.  Recommended home exercises rather than outpatient PT /        A total of 25 minutes of face to face time was spent with patient more than half of which was spent in counselling about the above mentioned conditions  and coordination of care  I have discontinued Ms. Cirilo's cetirizine, ergocalciferol, Red Yeast Rice, oseltamivir, and promethazine. I am also having her maintain her omeprazole and ipratropium.  No orders of the defined types were placed in this encounter.    Medications Discontinued During This Encounter  Medication Reason  . Red Yeast Rice 600 MG CAPS Patient Preference  . promethazine (PROMETHEGAN) 25 MG suppository Patient Preference  . oseltamivir  (TAMIFLU) 75 MG capsule Error  . ergocalciferol (DRISDOL) 50000 UNITS capsule Error  . cetirizine (ZYRTEC) 10 MG tablet Error    Follow-up: No Follow-up on file.   Crecencio Mc, MD

## 2014-11-08 LAB — COMPREHENSIVE METABOLIC PANEL
ALBUMIN: 4.5 g/dL (ref 3.5–5.2)
ALK PHOS: 47 U/L (ref 39–117)
ALT: 16 U/L (ref 0–35)
AST: 22 U/L (ref 0–37)
BILIRUBIN TOTAL: 0.5 mg/dL (ref 0.2–1.2)
BUN: 22 mg/dL (ref 6–23)
CO2: 30 mEq/L (ref 19–32)
Calcium: 9.8 mg/dL (ref 8.4–10.5)
Chloride: 102 mEq/L (ref 96–112)
Creatinine, Ser: 0.83 mg/dL (ref 0.40–1.20)
GFR: 71.08 mL/min (ref >60.00)
GLUCOSE: 92 mg/dL (ref 70–99)
POTASSIUM: 4 meq/L (ref 3.5–5.1)
Sodium: 138 mEq/L (ref 135–145)
TOTAL PROTEIN: 7 g/dL (ref 6.0–8.3)

## 2014-11-08 LAB — TSH: TSH: 1.24 u[IU]/mL (ref 0.35–4.50)

## 2014-11-10 ENCOUNTER — Encounter: Payer: Self-pay | Admitting: Internal Medicine

## 2014-11-10 DIAGNOSIS — R252 Cramp and spasm: Secondary | ICD-10-CM | POA: Insufficient documentation

## 2014-11-10 DIAGNOSIS — R636 Underweight: Secondary | ICD-10-CM | POA: Insufficient documentation

## 2014-11-10 DIAGNOSIS — R29898 Other symptoms and signs involving the musculoskeletal system: Secondary | ICD-10-CM | POA: Insufficient documentation

## 2014-11-10 NOTE — Assessment & Plan Note (Signed)
Diet discussed and recommendations made.

## 2014-11-10 NOTE — Assessment & Plan Note (Signed)
Recommend turmeric and quinine. vascular exam is normal.

## 2014-11-10 NOTE — Assessment & Plan Note (Signed)
Her deficits are very subtle and she is fully functional.  Recommended home exercises rather than outpatient PT /

## 2014-11-10 NOTE — Assessment & Plan Note (Signed)
Current untreated symptoms are actually due to gas.  Recommended continuing Prilosec and adding Beano ac bid

## 2015-01-04 ENCOUNTER — Encounter: Payer: Self-pay | Admitting: *Deleted

## 2015-01-07 ENCOUNTER — Ambulatory Visit: Payer: Medicare Other | Admitting: Anesthesiology

## 2015-01-07 ENCOUNTER — Ambulatory Visit
Admission: RE | Admit: 2015-01-07 | Discharge: 2015-01-07 | Disposition: A | Discharge status: Home / Self Care | Payer: Medicare Other | Source: Ambulatory Visit | Attending: Unknown Physician Specialty | Admitting: Unknown Physician Specialty

## 2015-01-07 ENCOUNTER — Encounter: Payer: Self-pay | Admitting: *Deleted

## 2015-01-07 ENCOUNTER — Encounter
Admission: RE | Disposition: A | Discharge status: Home / Self Care | Payer: Self-pay | Source: Ambulatory Visit | Attending: Unknown Physician Specialty

## 2015-01-07 DIAGNOSIS — D120 Benign neoplasm of cecum: Secondary | ICD-10-CM | POA: Insufficient documentation

## 2015-01-07 DIAGNOSIS — Z882 Allergy status to sulfonamides status: Secondary | ICD-10-CM | POA: Diagnosis not present

## 2015-01-07 DIAGNOSIS — K449 Diaphragmatic hernia without obstruction or gangrene: Secondary | ICD-10-CM | POA: Insufficient documentation

## 2015-01-07 DIAGNOSIS — Z79899 Other long term (current) drug therapy: Secondary | ICD-10-CM | POA: Diagnosis not present

## 2015-01-07 DIAGNOSIS — Z86010 Personal history of colonic polyps: Secondary | ICD-10-CM | POA: Diagnosis present

## 2015-01-07 DIAGNOSIS — K2270 Barrett's esophagus without dysplasia: Secondary | ICD-10-CM | POA: Insufficient documentation

## 2015-01-07 DIAGNOSIS — K5730 Diverticulosis of large intestine without perforation or abscess without bleeding: Secondary | ICD-10-CM | POA: Diagnosis not present

## 2015-01-07 DIAGNOSIS — M2010 Hallux valgus (acquired), unspecified foot: Secondary | ICD-10-CM | POA: Diagnosis not present

## 2015-01-07 DIAGNOSIS — K640 First degree hemorrhoids: Secondary | ICD-10-CM | POA: Insufficient documentation

## 2015-01-07 DIAGNOSIS — I200 Unstable angina: Secondary | ICD-10-CM | POA: Insufficient documentation

## 2015-01-07 DIAGNOSIS — Z8249 Family history of ischemic heart disease and other diseases of the circulatory system: Secondary | ICD-10-CM | POA: Diagnosis not present

## 2015-01-07 DIAGNOSIS — K219 Gastro-esophageal reflux disease without esophagitis: Secondary | ICD-10-CM | POA: Diagnosis not present

## 2015-01-07 DIAGNOSIS — E785 Hyperlipidemia, unspecified: Secondary | ICD-10-CM | POA: Diagnosis not present

## 2015-01-07 DIAGNOSIS — D123 Benign neoplasm of transverse colon: Secondary | ICD-10-CM | POA: Insufficient documentation

## 2015-01-07 DIAGNOSIS — Z87891 Personal history of nicotine dependence: Secondary | ICD-10-CM | POA: Diagnosis not present

## 2015-01-07 DIAGNOSIS — M810 Age-related osteoporosis without current pathological fracture: Secondary | ICD-10-CM | POA: Diagnosis not present

## 2015-01-07 HISTORY — DX: Cramp and spasm: R25.2

## 2015-01-07 HISTORY — DX: Benign neoplasm of colon, unspecified: D12.6

## 2015-01-07 HISTORY — PX: COLONOSCOPY WITH PROPOFOL: SHX5780

## 2015-01-07 HISTORY — DX: Personal history of other diseases of the digestive system: Z87.19

## 2015-01-07 HISTORY — DX: Hallux valgus (acquired), unspecified foot: M20.10

## 2015-01-07 HISTORY — DX: Unspecified hemorrhoids: K64.9

## 2015-01-07 HISTORY — DX: Female genital prolapse, unspecified: N81.9

## 2015-01-07 HISTORY — PX: ESOPHAGOGASTRODUODENOSCOPY: SHX5428

## 2015-01-07 HISTORY — DX: Barrett's esophagus without dysplasia: K22.70

## 2015-01-07 HISTORY — DX: Polyp of colon: K63.5

## 2015-01-07 LAB — HM COLONOSCOPY: HM Colonoscopy: NEGATIVE

## 2015-01-07 LAB — HM MAMMOGRAPHY: HM Mammogram: 2

## 2015-01-07 SURGERY — COLONOSCOPY WITH PROPOFOL
Anesthesia: General

## 2015-01-07 MED ORDER — SODIUM CHLORIDE 0.9 % IV SOLN
INTRAVENOUS | Status: DC
  Administered 2015-01-07: 09:00:00 via INTRAVENOUS

## 2015-01-07 MED ORDER — LIDOCAINE HCL (PF) 2 % IJ SOLN
INTRAMUSCULAR | Status: DC | PRN
  Administered 2015-01-07: 50 mg

## 2015-01-07 MED ORDER — PHENYLEPHRINE HCL 10 MG/ML IJ SOLN
INTRAMUSCULAR | Status: DC | PRN
  Administered 2015-01-07 (×3): 100 ug via INTRAVENOUS

## 2015-01-07 MED ORDER — PROPOFOL 10 MG/ML IV BOLUS
INTRAVENOUS | Status: DC | PRN
  Administered 2015-01-07 (×3): 20 mg via INTRAVENOUS
  Administered 2015-01-07: 30 mg via INTRAVENOUS
  Administered 2015-01-07: 20 mg via INTRAVENOUS

## 2015-01-07 MED ORDER — ESMOLOL HCL 10 MG/ML IV SOLN
INTRAVENOUS | Status: DC | PRN
  Administered 2015-01-07: 20 mg via INTRAVENOUS

## 2015-01-07 MED ORDER — FENTANYL CITRATE (PF) 100 MCG/2ML IJ SOLN
INTRAMUSCULAR | Status: DC | PRN
  Administered 2015-01-07: 50 ug via INTRAVENOUS

## 2015-01-07 MED ORDER — SODIUM CHLORIDE 0.9 % IV SOLN: INTRAVENOUS | Status: DC

## 2015-01-07 MED ORDER — PROPOFOL INFUSION 10 MG/ML OPTIME
INTRAVENOUS | Status: DC | PRN
  Administered 2015-01-07: 100 ug/kg/min via INTRAVENOUS

## 2015-01-07 NOTE — Anesthesia Postprocedure Evaluation (Signed)
  Anesthesia Post-op Note  Patient: Karen Parsons  Procedure(s) Performed: Procedure(s): COLONOSCOPY WITH PROPOFOL (N/A) ESOPHAGOGASTRODUODENOSCOPY (EGD) (N/A)  Anesthesia type:General  Patient location: PACU  Post pain: Pain level controlled  Post assessment: Post-op Vital signs reviewed, Patient's Cardiovascular Status Stable, Respiratory Function Stable, Patent Airway and No signs of Nausea or vomiting  Post vital signs: Reviewed and stable  Last Vitals:  Filed Vitals:   01/07/15 1000  BP: 138/83  Pulse: 70  Temp:   Resp: 13    Level of consciousness: awake, alert  and patient cooperative  Complications: No apparent anesthesia complications

## 2015-01-07 NOTE — H&P (Signed)
Primary Care Physician:  Karen Mc, MD Primary Gastroenterologist:  Dr. Vira Agar  Pre-Procedure History & Physical: HPI:  [name] is a 74 y.o. female is here for an endoscopy and colonoscopy.   Past Medical History  Diagnosis Date  . Hyperlipidemia     intolerant of statins, normal diagnostic cath July 2010 Nehemiah Massed)  . GERD (gastroesophageal reflux disease)   . Osteoporosis     by DEXA 2008  . S/P colonoscopy 2011    Cailee Blanke, polyp removed  . Barrett's esophagus July 2010    Wohl,   . Hyperlipidemia   . Cramp of limb   . Hemorrhoids   . Intermediate coronary syndrome   . Hallux valgus   . Colon polyps   . History of hiatal hernia   . Genital prolapse   . Barrett esophagus   . Benign neoplasm of colon     Past Surgical History  Procedure Laterality Date  . Abdominal hysterectomy      age 67, menorrhagia  . Cardiac catheterization Left July 2010    [name]:  normal  . Medial meniscotomy Left 2007    [name]  . Osteotomy toes Right 2008    [name]  . Cataract extraction Left 2010    [name]  . Arthroscopy of knee Right   . Tonsillectomy      Prior to Admission medications   Medication Sig Start Date End Date Taking? Authorizing Provider  naproxen sodium (ANAPROX) 220 MG tablet Take 220 mg by mouth 2 (two) times daily with a meal.   Yes Historical Provider, MD  omeprazole (PRILOSEC) 20 MG capsule Take 20 mg by mouth daily.     Yes Historical Provider, MD  ipratropium (ATROVENT) 0.03 % nasal spray Place 2 sprays into both nostrils 2 (two) times daily. 06/07/14   Karen Mc, MD    Allergies as of 11/16/2014 - Review Complete 11/10/2014  Allergen Reaction Noted  . Sulfamethoxazole-trimethoprim Nausea Only 04/17/2014    Family History  Problem Relation Age of Onset  . Hypertension Mother   . Heart disease Father   . Heart disease Brother     History   Social History  . Marital Status: Widowed    Spouse Name: N/A  . Number of  Children: N/A  . Years of Education: N/A   Occupational History  . Not on file.   Social History Main Topics  . Smoking status: Former Smoker    Quit date: 06/22/1968  . Smokeless tobacco: Never Used  . Alcohol Use: 1.5 oz/week    3 Standard drinks or equivalent per week     Comment: wine x1 a night  . Drug Use: No  . Sexual Activity: Not on file   Other Topics Concern  . Not on file   Social History Narrative    Review of Systems: See HPI, otherwise negative ROS  Physical Exam: BP 158/93 mmHg  Pulse 80  Temp(Src) 97.4 F (36.3 C) (Tympanic)  Resp 18  Ht 5\' 3"  (1.6 m)  Wt 53.978 kg (119 lb)  BMI 21.09 kg/m2  SpO2 100% General:   Alert,  pleasant and cooperative in NAD Head:  Normocephalic and atraumatic. Neck:  Supple; no masses or thyromegaly. Lungs:  Clear throughout to auscultation.    Heart:  Regular rate and rhythm. Abdomen:  Soft, nontender and nondistended. Normal bowel sounds, without guarding, and without rebound.   Neurologic:  Alert and  oriented x4;  grossly normal neurologically.  Impression/Plan: Meryl Crutch  is here for an endoscopy and colonoscopy to be performed for [facility] colon polyps, Barretts esophagus  Risks, benefits, limitations, and alternatives regarding  endoscopy and colonoscopy have been reviewed with the patient.  Questions have been answered.  All parties agreeable.   Gaylyn Cheers, MD  01/07/2015, 8:47 AM   Primary Care Physician:  Karen Mc, MD Primary Gastroenterologist:  Dr. Vira Agar  Pre-Procedure History & Physical: HPI:  Karen Parsons is a 74 y.o. female is here for an endoscopy and colonoscopy.   Past Medical History  Diagnosis Date  . Hyperlipidemia     intolerant of statins, normal diagnostic cath July 2010 Nehemiah Massed)  . GERD (gastroesophageal reflux disease)   . Osteoporosis     by DEXA 2008  . S/P colonoscopy 2011    Drexel Ivey, polyp removed  . Barrett's esophagus July 2010    Wohl,   . Hyperlipidemia   .  Cramp of limb   . Hemorrhoids   . Intermediate coronary syndrome   . Hallux valgus   . Colon polyps   . History of hiatal hernia   . Genital prolapse   . Barrett esophagus   . Benign neoplasm of colon     Past Surgical History  Procedure Laterality Date  . Abdominal hysterectomy      age 68, menorrhagia  . Cardiac catheterization Left July 2010    [name]:  normal  . Medial meniscotomy Left 2007    [name]  . Osteotomy toes Right 2008    [name]  . Cataract extraction Left 2010    [name]  . Arthroscopy of knee Right   . Tonsillectomy      Prior to Admission medications   Medication Sig Start Date End Date Taking? Authorizing Provider  naproxen sodium (ANAPROX) 220 MG tablet Take 220 mg by mouth 2 (two) times daily with a meal.   Yes Historical Provider, MD  omeprazole (PRILOSEC) 20 MG capsule Take 20 mg by mouth daily.     Yes Historical Provider, MD  ipratropium (ATROVENT) 0.03 % nasal spray Place 2 sprays into both nostrils 2 (two) times daily. 06/07/14   Karen Mc, MD    Allergies as of 11/16/2014 - Review Complete 11/10/2014  Allergen Reaction Noted  . Sulfamethoxazole-trimethoprim Nausea Only 04/17/2014    Family History  Problem Relation Age of Onset  . Hypertension Mother   . Heart disease Father   . Heart disease Brother     History   Social History  . Marital Status: Widowed    Spouse Name: N/A  . Number of Children: N/A  . Years of Education: N/A   Occupational History  . Not on file.   Social History Main Topics  . Smoking status: Former Smoker    Quit date: 06/22/1968  . Smokeless tobacco: Never Used  . Alcohol Use: 1.5 oz/week    3 Standard drinks or equivalent per week     Comment: wine x1 a night  . Drug Use: No  . Sexual Activity: Not on file   Other Topics Concern  . Not on file   Social History Narrative    Review of Systems: See HPI, otherwise negative ROS  Physical Exam: BP 158/93 mmHg  Pulse 80   Temp(Src) 97.4 F (36.3 C) (Tympanic)  Resp 18  Ht 5\' 3"  (1.6 m)  Wt 53.978 kg (119 lb)  BMI 21.09 kg/m2  SpO2 100% General:   Alert,  pleasant and cooperative in NAD Head:  Normocephalic and atraumatic.  Neck:  Supple; no masses or thyromegaly. Lungs:  Clear throughout to auscultation.    Heart:  Regular rate and rhythm. Abdomen:  Soft, nontender and nondistended. Normal bowel sounds, without guarding, and without rebound.   Neurologic:  Alert and  oriented x4;  grossly normal neurologically.  Impression/Plan: Melodye Swor is here for an endoscopy and colonoscopy to be performed for PH hx of colon polyps, F?U Barretts esophagus  Risks, benefits, limitations, and alternatives regarding  endoscopy and colonoscopy have been reviewed with the patient.  Questions have been answered.  All parties agreeable.   Gaylyn Cheers, MD  01/07/2015, 8:47 AM

## 2015-01-07 NOTE — Op Note (Signed)
[facility] Gastroenterology Patient Name: Karen Parsons Procedure Date: 01/07/2015 8:51 AM MRN: [id number] Account #: [id number] Date of Birth: 09-Dec-1940 Admit Type: Outpatient Age: 74 Room: [facility] ENDO ROOM 1 Gender: Female Note Status: Finalized Procedure:         Colonoscopy Indications:       Personal history of colonic polyps Providers:         Manya Silvas, MD Referring MD:      Deborra Medina, MD (Referring MD) Medicines:         Propofol per Anesthesia Complications:     No immediate complications. Procedure:         Pre-Anesthesia Assessment:                    - After reviewing the risks and benefits, the patient was                     deemed in satisfactory condition to undergo the procedure.                    After obtaining informed consent, the colonoscope was                     passed under direct vision. Throughout the procedure, the                     patient's blood pressure, pulse, and oxygen saturations                     were monitored continuously. The Colonoscope was                     introduced through the anus and advanced to the the cecum,                     identified by appendiceal orifice and ileocecal valve. The                     colonoscopy was performed without difficulty. The patient                     tolerated the procedure well. The quality of the bowel                     preparation was excellent. Findings:      A diminutive polyp was found in the cecum. The polyp was sessile. The       polyp was removed with a jumbo cold forceps. Resection and retrieval       were complete.      A diminutive polyp was found at the hepatic flexure. The polyp was       sessile. The polyp was removed with a jumbo cold forceps. Resection and       retrieval were complete.      A few small-mouthed diverticula were found in the sigmoid colon and in       the descending colon.      Internal hemorrhoids were found during endoscopy.  The hemorrhoids were       small and Grade I (internal hemorrhoids that do not prolapse).      The exam was otherwise without abnormality. Impression:        - One diminutive polyp in the cecum. Resected and  retrieved.                    - One diminutive polyp at the hepatic flexure. Resected                     and retrieved.                    - Diverticulosis in the sigmoid colon and in the                     descending colon.                    - Internal hemorrhoids.                    - The examination was otherwise normal. Recommendation:    - Await pathology results. Manya Silvas, MD 01/07/2015 9:28:17 AM This report has been signed electronically. Number of Addenda: 0 Note Initiated On: 01/07/2015 8:51 AM Scope Withdrawal Time: 0 hours 11 minutes 40 seconds  Total Procedure Duration: 0 hours 21 minutes 7 seconds       [facility]

## 2015-01-07 NOTE — Transfer of Care (Signed)
Immediate Anesthesia Transfer of Care Note  Patient: Karen Parsons  Procedure(s) Performed: Procedure(s): COLONOSCOPY WITH PROPOFOL (N/A) ESOPHAGOGASTRODUODENOSCOPY (EGD) (N/A)  Patient Location: PACU  Anesthesia Type:General  Level of Consciousness: sedated  Airway & Oxygen Therapy: Patient Spontanous Breathing and Patient connected to nasal cannula oxygen  Post-op Assessment: Report given to RN and Post -op Vital signs reviewed and stable  Post vital signs: Reviewed and stable  Last Vitals:  Filed Vitals:   01/07/15 0816  BP: 158/93  Pulse: 80  Temp: 36.3 C  Resp: 18    Complications: No apparent anesthesia complications

## 2015-01-07 NOTE — Anesthesia Preprocedure Evaluation (Signed)
Anesthesia Evaluation  Patient identified by MRN, date of birth, ID band Patient awake    Reviewed: Allergy & Precautions, NPO status , Patient's Chart, lab work & pertinent test results  Airway Mallampati: II  TM Distance: >3 FB Neck ROM: Full    Dental  (+) Teeth Intact   Pulmonary former smoker,  breath sounds clear to auscultation        Cardiovascular Rhythm:Regular Rate:Normal  HX of normal heart cath.   Neuro/Psych    GI/Hepatic GERD-  Medicated and Controlled,Hx Barrett's esophagus.   Endo/Other    Renal/GU      Musculoskeletal   Abdominal (+)  Abdomen: soft.    Peds  Hematology   Anesthesia Other Findings   Reproductive/Obstetrics                             Anesthesia Physical Anesthesia Plan  ASA: II  Anesthesia Plan: General   Post-op Pain Management:    Induction: Intravenous  Airway Management Planned: Nasal Cannula  Additional Equipment:   Intra-op Plan:   Post-operative Plan:   Informed Consent: I have reviewed the patients History and Physical, chart, labs and discussed the procedure including the risks, benefits and alternatives for the proposed anesthesia with the patient or authorized representative who has indicated his/her understanding and acceptance.     Plan Discussed with: CRNA  Anesthesia Plan Comments:         Anesthesia Quick Evaluation

## 2015-01-07 NOTE — Op Note (Signed)
[facility] Gastroenterology Patient Name: Karen Parsons Procedure Date: 01/07/2015 8:45 AM MRN: [id number] Account #: [id number] Date of Birth: 11-23-1940 Admit Type: Outpatient Age: 73 Room: [facility] ENDO ROOM 1 Gender: Female Note Status: Finalized Procedure:         Upper GI endoscopy Indications:       Follow-up of Barrett's esophagus Providers:         Manya Silvas, MD Referring MD:      Deborra Medina, MD (Referring MD) Medicines:         Propofol per Anesthesia Complications:     No immediate complications. Procedure:         Pre-Anesthesia Assessment:                    - After reviewing the risks and benefits, the patient was                     deemed in satisfactory condition to undergo the procedure.                    After obtaining informed consent, the endoscope was passed                     under direct vision. Throughout the procedure, the                     patient's blood pressure, pulse, and oxygen saturations                     were monitored continuously. The Endoscope was introduced                     through the mouth, and advanced to the second part of                     duodenum. The upper GI endoscopy was accomplished without                     difficulty. The patient tolerated the procedure well. Findings:      There were esophageal mucosal changes secondary to established       short-segment Barrett's disease present in the lower third of the       esophagus. The maximum longitudinal extent of these mucosal changes was       3 cm in length. Mucosa was biopsied with a cold forceps for histology.       One specimen bottle was sent to pathology.      A small hiatus hernia was present.      The stomach was normal.      The examined duodenum was normal. Impression:        - Esophageal mucosal changes secondary to established                     short-segment Barrett's disease. Biopsied.                    - Small hiatus hernia.                  - Normal stomach.                    - Normal examined duodenum. Recommendation:    - Await pathology results. Manya Silvas, MD 01/07/2015 9:03:05 AM This report has been signed  electronically. Number of Addenda: 0 Note Initiated On: 01/07/2015 8:45 AM      [facility]

## 2015-01-08 LAB — SURGICAL PATHOLOGY

## 2015-01-14 ENCOUNTER — Encounter: Payer: Self-pay | Admitting: Unknown Physician Specialty

## 2015-01-21 ENCOUNTER — Encounter: Payer: Self-pay | Admitting: Internal Medicine

## 2015-01-28 ENCOUNTER — Other Ambulatory Visit: Payer: Self-pay | Admitting: Internal Medicine

## 2015-01-28 DIAGNOSIS — Z760 Encounter for issue of repeat prescription: Secondary | ICD-10-CM

## 2015-02-01 ENCOUNTER — Encounter: Payer: Self-pay | Admitting: Internal Medicine

## 2015-02-01 LAB — HM MAMMOGRAPHY

## 2015-03-14 ENCOUNTER — Encounter: Payer: Self-pay | Admitting: Emergency Medicine

## 2015-03-14 ENCOUNTER — Emergency Department: Payer: Medicare Other

## 2015-03-14 ENCOUNTER — Emergency Department
Admission: EM | Admit: 2015-03-14 | Discharge: 2015-03-14 | Disposition: A | Discharge status: Home / Self Care | Payer: Medicare Other | Attending: Emergency Medicine | Admitting: Emergency Medicine

## 2015-03-14 DIAGNOSIS — K2270 Barrett's esophagus without dysplasia: Secondary | ICD-10-CM | POA: Insufficient documentation

## 2015-03-14 DIAGNOSIS — Z87891 Personal history of nicotine dependence: Secondary | ICD-10-CM | POA: Insufficient documentation

## 2015-03-14 DIAGNOSIS — M79641 Pain in right hand: Secondary | ICD-10-CM | POA: Diagnosis present

## 2015-03-14 DIAGNOSIS — M19041 Primary osteoarthritis, right hand: Secondary | ICD-10-CM | POA: Diagnosis not present

## 2015-03-14 DIAGNOSIS — Z79899 Other long term (current) drug therapy: Secondary | ICD-10-CM | POA: Insufficient documentation

## 2015-03-14 MED ORDER — MELOXICAM 7.5 MG PO TABS: 7.5000 mg | ORAL_TABLET | Freq: Every day | ORAL | Status: DC

## 2015-03-14 NOTE — ED Provider Notes (Signed)
[facility] Emergency Department Provider Note  ____________________________________________  Time seen: Approximately 7:57 AM  I have reviewed the triage vital signs and the nursing notes.   HISTORY  Chief Complaint Hand Pain   HPI Karen Parsons is a 74 y.o. female is here with complaint of right thumb swollen and tender for the last 2-3 days. Patient states that there is been no injury she is aware of. She has not had any problems with her hand prior to this. There is no history of gout. She has taken some Tylenol without relief. 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 She rates her pain a 4 out of 10. Use of her thumb increases her pain somewhat but she is able to make a fist without any difficulty.   Past Medical History  Diagnosis Date  . Hyperlipidemia     intolerant of statins, normal diagnostic cath July 2010 Nehemiah Massed)  . GERD (gastroesophageal reflux disease)   . Osteoporosis     by DEXA 2008  . S/P colonoscopy 2011    [name], polyp removed  . Barrett's esophagus July 2010    Wohl,   . Hyperlipidemia   . Cramp of limb   . Hemorrhoids   . Intermediate coronary syndrome   . Hallux valgus   . Colon polyps   . History of hiatal hernia   . Genital prolapse   . Barrett esophagus   . Benign neoplasm of colon     Patient Active Problem List   Diagnosis Date Noted  . Underweight 11/10/2014  . Muscle cramps at night 11/10/2014  . Complaints of leg weakness 11/10/2014  . Influenza with respiratory manifestations 08/22/2014  . Medicare annual wellness visit, subsequent 04/18/2014  . Prolapse of female pelvic organs 07/11/2013  . Screening for breast cancer 10/29/2012  . Tubular adenoma of colon 10/29/2012  . Hyperlipidemia   . GERD (gastroesophageal reflux disease)   . S/P colonoscopy   . Barrett's esophagus     Past Surgical History  Procedure Laterality Date  . Abdominal  hysterectomy      age 37, menorrhagia  . Cardiac catheterization Left July 2010    [name]:  normal  . Medial meniscotomy Left 2007    [name]  . Osteotomy toes Right 2008    [name]  . Cataract extraction Left 2010    [name]  . Arthroscopy of knee Right   . Tonsillectomy    . Colonoscopy with propofol N/A 01/07/2015    Procedure: COLONOSCOPY WITH PROPOFOL;  Surgeon: Manya Silvas, MD;  Location: [facility] ENDOSCOPY;  Service: Endoscopy;  Laterality: N/A;  . Esophagogastroduodenoscopy N/A 01/07/2015    Procedure: ESOPHAGOGASTRODUODENOSCOPY (EGD);  Surgeon: Manya Silvas, MD;  Location: [facility] ENDOSCOPY;  Service: Endoscopy;  Laterality: N/A;    Current Outpatient Rx  Name  Route  Sig  Dispense  Refill  . ipratropium (ATROVENT) 0.03 % nasal spray      PLACE 2 SPRAYS INTO BOTH NOSTRILS 2 (TWO) TIMES DAILY.   30 mL   2   . omeprazole (PRILOSEC) 20 MG capsule   Oral   Take 20 mg by mouth daily.           . meloxicam (MOBIC) 7.5 MG tablet   Oral   Take 1 tablet (7.5 mg total) by mouth daily.   14 tablet   2     Allergies Sulfamethoxazole-trimethoprim  Family History  Problem Relation Age of Onset  .  Hypertension Mother   . Heart disease Father   . Heart disease Brother     Social History Social History  Substance Use Topics  . Smoking status: Former Smoker    Quit date: 06/22/1968  . Smokeless tobacco: Never Used  . Alcohol Use: 1.5 oz/week    3 Standard drinks or equivalent per week     Comment: wine x1 a night    Review of Systems Constitutional: No fever/chills Eyes: No visual changes. ENT: No sore throat. Cardiovascular: Denies chest pain. Respiratory: Denies shortness of breath. Gastrointestinal: Positive Barrett's esophagitis.  No nausea, no vomiting.  Genitourinary: Negative for dysuria. Musculoskeletal: Negative for back pain. Skin: Negative for rash. Neurological: Negative for headaches, focal weakness or numbness.  10-point ROS  otherwise negative.  ____________________________________________   PHYSICAL EXAM:  VITAL SIGNS: ED Triage Vitals  Enc Vitals Group     BP 03/14/15 0738 141/100 mmHg     Pulse Rate 03/14/15 0738 80     Resp 03/14/15 0738 14     Temp 03/14/15 0738 98.1 F (36.7 C)     Temp Source 03/14/15 0738 Oral     SpO2 03/14/15 0738 98 %     Weight 03/14/15 0738 117 lb (53.071 kg)     Height 03/14/15 0738 5\' 3"  (1.6 m)     Head Cir --      Peak Flow --      Pain Score 03/14/15 0739 4     Pain Loc --      Pain Edu? --      Excl. in [place]? --     Constitutional: Alert and oriented. Well appearing and in no acute distress. Eyes: Conjunctivae are normal. PERRL. EOMI. Head: Atraumatic. Nose: No congestion/rhinnorhea. Neck: No stridor.   Cardiovascular: Normal rate, regular rhythm. Grossly normal heart sounds.  Good peripheral circulation. Respiratory: Normal respiratory effort.  No retractions. Lungs CTAB. Gastrointestinal: Soft and nontender. No distention.  Musculoskeletal: Right thumb has a degenerative appearance in the joints. Range of motion is slightly restricted secondary to pain but patient is able to make a fist. Motor sensory function intact. Mild tenderness on palpation of the DIP joint. No lower extremity tenderness nor edema.  No joint effusions. Neurologic:  Normal speech and language. No gross focal neurologic deficits are appreciated. No gait instability. Skin:  Skin is warm, dry and intact. No rash noted. There is no erythema or warmth of the right thumb Psychiatric: Mood and affect are normal. Speech and behavior are normal.  ____________________________________________   LABS (all labs ordered are listed, but only abnormal results are displayed)  Labs Reviewed - No data to display ____________________________________________ RADIOLOGY  X-rays right hand shows mild osteoarthritis with some spurring along the distal interphalangeal joints. I, Johnn Hai,  personally viewed and evaluated these images (plain radiographs) as part of my medical decision making.  ____________________________________________   PROCEDURES  Procedure(s) performed: None  Critical Care performed: No  ____________________________________________   INITIAL IMPRESSION / ASSESSMENT AND PLAN / ED COURSE  Pertinent labs & imaging results that were available during my care of the patient were reviewed by me and considered in my medical decision making (see chart for details   patient will discontinue taking Aleve. She was given a prescription for Mobic 7.5mg  one daily with food.   FINAL CLINICAL IMPRESSION(S) / ED DIAGNOSES  Final diagnoses:  Primary osteoarthritis of right hand      Johnn Hai, PA-C 03/14/15 [place], MD  03/14/15 1252 

## 2015-03-14 NOTE — Discharge Instructions (Signed)
Arthritis, Nonspecific Arthritis is pain, redness, warmth, or puffiness (inflammation) of a joint. The joint may be stiff or hurt when you move it. One or more joints may be affected. There are many types of arthritis. Your doctor may not know what type you have right away. The most common cause of arthritis is wear and tear on the joint (osteoarthritis). HOME CARE   Only take medicine as told by your doctor.  Rest the joint as much as possible.  Raise (elevate) your joint if it is puffy.  Use crutches if the painful joint is in your leg.  Drink enough fluids to keep your pee (urine) clear or pale yellow.  Follow your doctor's diet instructions.  Use cold packs for very bad joint pain for 10 to 15 minutes every hour. Ask your doctor if it is okay for you to use hot packs.  Exercise as told by your doctor.  Take a warm shower if you have stiffness in the morning.  Move your sore joints throughout the day. GET HELP RIGHT AWAY IF:   You have a fever.  You have very bad joint pain, puffiness, or redness.  You have many joints that are painful and puffy.  You are not getting better with treatment.  You have very bad back pain or leg weakness.  You cannot control when you poop (bowel movement) or pee (urinate).  You do not feel better in 24 hours or are getting worse.  You are having side effects from your medicine. MAKE SURE YOU:   Understand these instructions.  Will watch your condition.  Will get help right away if you are not doing well or get worse. Document Released: 09/02/2009 Document Revised: 12/08/2011 Document Reviewed: 09/02/2009 [facility] Patient Information 2015 [place], [place]. This information is not intended to replace advice given to you by your health care provider. Make sure you discuss any questions you have with your health care provider.    IF YOU ARE STILL TAKING  ANAPROX STOP.   MOBIC ONCE A DAY WITH FOOD

## 2015-03-14 NOTE — ED Notes (Signed)
Right thumb swollen and tender for about 2-3 days . Denies any injury.good circulation and positive pulses

## 2015-03-14 NOTE — ED Notes (Signed)
Right thumb swelling this am and knuckle swollen.  No injury

## 2015-05-23 ENCOUNTER — Encounter
Admission: RE | Disposition: A | Discharge status: Home / Self Care | Payer: Self-pay | Source: Ambulatory Visit | Attending: Unknown Physician Specialty

## 2015-05-23 ENCOUNTER — Ambulatory Visit
Admission: RE | Admit: 2015-05-23 | Discharge: 2015-05-23 | Disposition: A | Discharge status: Home / Self Care | Payer: Medicare Other | Source: Ambulatory Visit | Attending: Unknown Physician Specialty | Admitting: Unknown Physician Specialty

## 2015-05-23 ENCOUNTER — Ambulatory Visit: Payer: Medicare Other | Admitting: Anesthesiology

## 2015-05-23 DIAGNOSIS — I200 Unstable angina: Secondary | ICD-10-CM | POA: Diagnosis not present

## 2015-05-23 DIAGNOSIS — E785 Hyperlipidemia, unspecified: Secondary | ICD-10-CM | POA: Diagnosis not present

## 2015-05-23 DIAGNOSIS — K317 Polyp of stomach and duodenum: Secondary | ICD-10-CM | POA: Insufficient documentation

## 2015-05-23 DIAGNOSIS — Z86010 Personal history of colonic polyps: Secondary | ICD-10-CM | POA: Insufficient documentation

## 2015-05-23 DIAGNOSIS — Z90710 Acquired absence of both cervix and uterus: Secondary | ICD-10-CM | POA: Insufficient documentation

## 2015-05-23 DIAGNOSIS — Z882 Allergy status to sulfonamides status: Secondary | ICD-10-CM | POA: Insufficient documentation

## 2015-05-23 DIAGNOSIS — K210 Gastro-esophageal reflux disease with esophagitis: Secondary | ICD-10-CM | POA: Insufficient documentation

## 2015-05-23 DIAGNOSIS — Z9842 Cataract extraction status, left eye: Secondary | ICD-10-CM | POA: Insufficient documentation

## 2015-05-23 DIAGNOSIS — M810 Age-related osteoporosis without current pathological fracture: Secondary | ICD-10-CM | POA: Diagnosis not present

## 2015-05-23 DIAGNOSIS — Z79899 Other long term (current) drug therapy: Secondary | ICD-10-CM | POA: Insufficient documentation

## 2015-05-23 DIAGNOSIS — Z87891 Personal history of nicotine dependence: Secondary | ICD-10-CM | POA: Diagnosis not present

## 2015-05-23 DIAGNOSIS — K2270 Barrett's esophagus without dysplasia: Secondary | ICD-10-CM | POA: Diagnosis present

## 2015-05-23 DIAGNOSIS — K449 Diaphragmatic hernia without obstruction or gangrene: Secondary | ICD-10-CM | POA: Diagnosis not present

## 2015-05-23 HISTORY — PX: ESOPHAGOGASTRODUODENOSCOPY (EGD) WITH PROPOFOL: SHX5813

## 2015-05-23 SURGERY — ESOPHAGOGASTRODUODENOSCOPY (EGD) WITH PROPOFOL
Anesthesia: General

## 2015-05-23 MED ORDER — SODIUM CHLORIDE 0.9 % IV SOLN
INTRAVENOUS | Status: DC
Start: 2015-05-23 — End: 2015-05-23
  Administered 2015-05-23: 1000 mL via INTRAVENOUS

## 2015-05-23 MED ORDER — ESMOLOL HCL 100 MG/10ML IV SOLN
INTRAVENOUS | Status: DC | PRN
  Administered 2015-05-23: 10 mg via INTRAVENOUS
  Administered 2015-05-23: 20 mg via INTRAVENOUS

## 2015-05-23 MED ORDER — SODIUM CHLORIDE 0.9 % IV SOLN
INTRAVENOUS | Status: DC
Start: 2015-05-23 — End: 2015-05-23

## 2015-05-23 MED ORDER — PROPOFOL 500 MG/50ML IV EMUL
INTRAVENOUS | Status: DC | PRN
  Administered 2015-05-23: 100 ug/kg/min via INTRAVENOUS

## 2015-05-23 MED ORDER — PROPOFOL 10 MG/ML IV BOLUS
INTRAVENOUS | Status: DC | PRN
  Administered 2015-05-23: 20 mg via INTRAVENOUS
  Administered 2015-05-23: 30 mg via INTRAVENOUS

## 2015-05-23 MED ORDER — FENTANYL CITRATE (PF) 100 MCG/2ML IJ SOLN
INTRAMUSCULAR | Status: DC | PRN
  Administered 2015-05-23: 50 ug via INTRAVENOUS

## 2015-05-23 NOTE — Op Note (Signed)
[facility] Gastroenterology Patient Name: Karen Parsons Procedure Date: 05/23/2015 8:06 AM MRN: [id number] Account #: [id number] Date of Birth: 05-17-1941 Admit Type: Outpatient Age: 74 Room: [facility] ENDO ROOM 1 Gender: Female Note Status: Finalized Procedure:         Upper GI endoscopy Indications:       Follow-up of Barrett's esophagus Providers:         Manya Silvas, MD Referring MD:      Deborra Medina, MD (Referring MD) Medicines:         Propofol per Anesthesia Complications:     No immediate complications. Procedure:         Pre-Anesthesia Assessment:                    - After reviewing the risks and benefits, the patient was                     deemed in satisfactory condition to undergo the procedure.                    After obtaining informed consent, the endoscope was passed                     under direct vision. Throughout the procedure, the                     patient's blood pressure, pulse, and oxygen saturations                     were monitored continuously. The Endoscope was introduced                     through the mouth, and advanced to the second part of                     duodenum. The upper GI endoscopy was accomplished without                     difficulty. The patient tolerated the procedure well. Findings:      There were esophageal mucosal changes secondary to established       short-segment Barrett's disease present in the lower third of the       esophagus. The maximum longitudinal extent of these mucosal changes was       3 cm in length. Mucosa was biopsied with a cold forceps for histology.       One specimen bottle was sent to pathology.      A small-medium hiatus hernia was present.      A single small-medium pedunculated and sessile polyp with no bleeding       and no stigmata of recent bleeding was found on the anterior wall of the       distal gastric body. Biopsies were taken with a cold forceps for        histology. Impression:        - Esophageal mucosal changes secondary to established                     short-segment Barrett's disease. Biopsied.                    - Small hiatus hernia.                    - A single  gastric polyp. Biopsied. Recommendation:    - Await pathology results. Resume usual medicines. Manya Silvas, MD 05/23/2015 8:27:37 AM This report has been signed electronically. Number of Addenda: 0 Note Initiated On: 05/23/2015 8:06 AM      [facility]

## 2015-05-23 NOTE — Anesthesia Postprocedure Evaluation (Signed)
Anesthesia Post Note  Patient: Karen Parsons  Procedure(s) Performed: Procedure(s) (LRB): ESOPHAGOGASTRODUODENOSCOPY (EGD) WITH PROPOFOL (N/A)  Patient location during evaluation: PACU Anesthesia Type: General Level of consciousness: awake Pain management: pain level controlled Vital Signs Assessment: post-procedure vital signs reviewed and stable Respiratory status: spontaneous breathing Cardiovascular status: stable Anesthetic complications: no    Last Vitals:  Filed Vitals:   05/23/15 0833 05/23/15 0841  BP: 134/83 132/85  Pulse: 72 67  Temp:    Resp: 20 11    Last Pain: There were no vitals filed for this visit.               VAN STAVEREN,Aaryn Sermon

## 2015-05-23 NOTE — Anesthesia Preprocedure Evaluation (Signed)
Anesthesia Evaluation  Patient identified by MRN, date of birth, ID band Patient awake    Reviewed: Allergy & Precautions, NPO status , Patient's Chart, lab work & pertinent test results  Airway Mallampati: II       Dental  (+) Teeth Intact   Pulmonary neg pulmonary ROS, former smoker,    Pulmonary exam normal        Cardiovascular  Rhythm:Regular Rate:Normal     Neuro/Psych    GI/Hepatic Neg liver ROS, GERD  Medicated,  Endo/Other  negative endocrine ROS  Renal/GU negative Renal ROS     Musculoskeletal negative musculoskeletal ROS (+)   Abdominal Normal abdominal exam  (+)   Peds negative pediatric ROS (+)  Hematology negative hematology ROS (+)   Anesthesia Other Findings   Reproductive/Obstetrics                             Anesthesia Physical Anesthesia Plan  ASA: II  Anesthesia Plan: General   Post-op Pain Management:    Induction:   Airway Management Planned: Nasal Cannula  Additional Equipment:   Intra-op Plan:   Post-operative Plan:   Informed Consent: I have reviewed the patients History and Physical, chart, labs and discussed the procedure including the risks, benefits and alternatives for the proposed anesthesia with the patient or authorized representative who has indicated his/her understanding and acceptance.     Plan Discussed with: CRNA  Anesthesia Plan Comments:         Anesthesia Quick Evaluation

## 2015-05-23 NOTE — H&P (Signed)
Primary Care Physician:  Crecencio Mc, MD Primary Gastroenterologist:  Dr. Vira Agar  Pre-Procedure History & Physical: HPI:  Karen Parsons is a 74 y.o. female is here for an endoscopy.   Past Medical History  Diagnosis Date  . Hyperlipidemia     intolerant of statins, normal diagnostic cath July 2010 Nehemiah Massed)  . GERD (gastroesophageal reflux disease)   . Osteoporosis     by DEXA 2008  . S/P colonoscopy 2011    Kyle Stansell, polyp removed  . Barrett's esophagus July 2010    Wohl,   . Hyperlipidemia   . Cramp of limb   . Hemorrhoids   . Intermediate coronary syndrome ([place])   . Hallux valgus   . Colon polyps   . History of hiatal hernia   . Genital prolapse   . Barrett esophagus   . Benign neoplasm of colon     Past Surgical History  Procedure Laterality Date  . Abdominal hysterectomy      age 54, menorrhagia  . Cardiac catheterization Left July 2010    [name]:  normal  . Medial meniscotomy Left 2007    [name]  . Osteotomy toes Right 2008    [name]  . Cataract extraction Left 2010    [name]  . Arthroscopy of knee Right   . Tonsillectomy    . Colonoscopy with propofol N/A 01/07/2015    Procedure: COLONOSCOPY WITH PROPOFOL;  Surgeon: Manya Silvas, MD;  Location: [facility] ENDOSCOPY;  Service: Endoscopy;  Laterality: N/A;  . Esophagogastroduodenoscopy N/A 01/07/2015    Procedure: ESOPHAGOGASTRODUODENOSCOPY (EGD);  Surgeon: Manya Silvas, MD;  Location: [facility] ENDOSCOPY;  Service: Endoscopy;  Laterality: N/A;    Prior to Admission medications   Medication Sig Start Date End Date Taking? Authorizing Provider  ipratropium (ATROVENT) 0.03 % nasal spray PLACE 2 SPRAYS INTO BOTH NOSTRILS 2 (TWO) TIMES DAILY. 01/28/15   Crecencio Mc, MD  meloxicam (MOBIC) 7.5 MG tablet Take 1 tablet (7.5 mg total) by mouth daily. 03/14/15 03/13/16  Johnn Hai, PA-C  omeprazole (PRILOSEC) 20 MG capsule Take 20 mg by mouth daily.      Historical Provider, MD    Allergies as  of 04/17/2015 - Review Complete 03/14/2015  Allergen Reaction Noted  . Sulfamethoxazole-trimethoprim Nausea Only 04/17/2014    Family History  Problem Relation Age of Onset  . Hypertension Mother   . Heart disease Father   . Heart disease Brother     Social History   Social History  . Marital Status: Widowed    Spouse Name: N/A  . Number of Children: N/A  . Years of Education: N/A   Occupational History  . Not on file.   Social History Main Topics  . Smoking status: Former Smoker    Quit date: 06/22/1968  . Smokeless tobacco: Never Used  . Alcohol Use: 1.5 oz/week    3 Standard drinks or equivalent per week     Comment: wine x1 a night  . Drug Use: No  . Sexual Activity: Not on file   Other Topics Concern  . Not on file   Social History Narrative    Review of Systems: See HPI, otherwise negative ROS  Physical Exam: BP 136/95 mmHg  Pulse 64  Temp(Src) 97.4 F (36.3 C) (Tympanic)  Resp 16  Ht 5\' 3"  (1.6 m)  Wt 51.71 kg (114 lb)  BMI 20.20 kg/m2  SpO2 99% General:   Alert,  pleasant and cooperative in NAD Head:  Normocephalic and  atraumatic. Neck:  Supple; no masses or thyromegaly. Lungs:  Clear throughout to auscultation.    Heart:  Regular rate and rhythm. Abdomen:  Soft, nontender and nondistended. Normal bowel sounds, without guarding, and without rebound.   Neurologic:  Alert and  oriented x4;  grossly normal neurologically.  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  Questions have been answered.  All parties agreeable.   Gaylyn Cheers, MD  05/23/2015, 7:56 AM

## 2015-05-23 NOTE — Transfer of Care (Signed)
Immediate Anesthesia Transfer of Care Note  Patient: Karen Parsons  Procedure(s) Performed: Procedure(s): ESOPHAGOGASTRODUODENOSCOPY (EGD) WITH PROPOFOL (N/A)  Patient Location: PACU  Anesthesia Type:General  Level of Consciousness: awake  Airway & Oxygen Therapy: Patient Spontanous Breathing  Post-op Assessment: Report given to RN and Post -op Vital signs reviewed and stable  Post vital signs: Reviewed and stable  Last Vitals:  Filed Vitals:   05/23/15 0720  BP: 136/95  Pulse: 64  Temp: 36.3 C  Resp: 16    Complications: No apparent anesthesia complications

## 2015-05-24 LAB — SURGICAL PATHOLOGY

## 2015-05-27 ENCOUNTER — Encounter: Payer: Self-pay | Admitting: Unknown Physician Specialty

## 2015-05-31 ENCOUNTER — Telehealth: Payer: Self-pay | Admitting: Internal Medicine

## 2015-05-31 NOTE — Telephone Encounter (Signed)
Updated chart.

## 2015-05-31 NOTE — Telephone Encounter (Signed)
The patient called to inform the physician that she had her flu shot done at [facility] on [address] on 9.16.16.

## 2015-06-25 ENCOUNTER — Telehealth: Payer: Self-pay | Admitting: Internal Medicine

## 2015-06-25 NOTE — Telephone Encounter (Signed)
Yes with me , please inform Rasheedah as well

## 2015-06-25 NOTE — Telephone Encounter (Signed)
No problem, I knew that I didn't know if Karen Parsons had a reason for Dr. Lacinda Axon to do the injection.  Thanks would like me to schedule her for an appointment?

## 2015-06-25 NOTE — Telephone Encounter (Signed)
Pt called about needing a cortizone shot in her left knee. I know Dr Lacinda Axon does them. Will Dr Lacinda Axon give it to Dr Derrel Nip pt's? Thank You!

## 2015-06-25 NOTE — Telephone Encounter (Signed)
I provide cortisone injections for my won patients,  Thank you,  Unless she can't wait for an appt with me.

## 2015-06-25 NOTE — Telephone Encounter (Signed)
Dr. Derrel Nip, Please advise and then I will ask Dr., Lacinda Axon.  thanks

## 2015-06-26 NOTE — Telephone Encounter (Signed)
That is fine 

## 2015-06-26 NOTE — Telephone Encounter (Signed)
Ok. Thanks!

## 2015-06-26 NOTE — Telephone Encounter (Signed)
I called pt and she said she will wait until her appt on 07/18/2015. Is that ok?

## 2015-06-26 NOTE — Telephone Encounter (Signed)
Please schedule with Dr. Derrel Nip.  Thanks!!

## 2015-06-26 NOTE — Telephone Encounter (Signed)
Pt called back I want to know can I schedule a 15 min for this appt? Thank You!

## 2015-06-26 NOTE — Telephone Encounter (Signed)
Prefer a 35min. thanks

## 2015-06-26 NOTE — Telephone Encounter (Signed)
Ok. I called pt and left a vm to call the office. °

## 2015-07-01 ENCOUNTER — Ambulatory Visit (INDEPENDENT_AMBULATORY_CARE_PROVIDER_SITE_OTHER): Payer: Medicare Other | Admitting: Family Medicine

## 2015-07-01 ENCOUNTER — Other Ambulatory Visit: Payer: Self-pay | Admitting: Family Medicine

## 2015-07-01 ENCOUNTER — Encounter: Payer: Self-pay | Admitting: Family Medicine

## 2015-07-01 ENCOUNTER — Ambulatory Visit
Admission: RE | Admit: 2015-07-01 | Discharge: 2015-07-01 | Disposition: A | Discharge status: Home / Self Care | Payer: Medicare Other | Source: Ambulatory Visit | Attending: Family Medicine | Admitting: Family Medicine

## 2015-07-01 VITALS — BP 126/84 | HR 66 | Temp 97.7°F | Ht 63.0 in | Wt 117.2 lb

## 2015-07-01 DIAGNOSIS — R1030 Lower abdominal pain, unspecified: Secondary | ICD-10-CM | POA: Insufficient documentation

## 2015-07-01 DIAGNOSIS — N281 Cyst of kidney, acquired: Secondary | ICD-10-CM | POA: Insufficient documentation

## 2015-07-01 DIAGNOSIS — K5792 Diverticulitis of intestine, part unspecified, without perforation or abscess without bleeding: Secondary | ICD-10-CM | POA: Diagnosis not present

## 2015-07-01 DIAGNOSIS — N949 Unspecified condition associated with female genital organs and menstrual cycle: Secondary | ICD-10-CM

## 2015-07-01 DIAGNOSIS — N9489 Other specified conditions associated with female genital organs and menstrual cycle: Secondary | ICD-10-CM | POA: Insufficient documentation

## 2015-07-01 DIAGNOSIS — I7090 Unspecified atherosclerosis: Secondary | ICD-10-CM | POA: Diagnosis not present

## 2015-07-01 DIAGNOSIS — N839 Noninflammatory disorder of ovary, fallopian tube and broad ligament, unspecified: Secondary | ICD-10-CM | POA: Insufficient documentation

## 2015-07-01 LAB — POCT URINALYSIS DIPSTICK
Bilirubin, UA: NEGATIVE
Glucose, UA: NEGATIVE
Nitrite, UA: NEGATIVE
PH UA: 6
PROTEIN UA: NEGATIVE
SPEC GRAV UA: 1.015
UROBILINOGEN UA: 1

## 2015-07-01 LAB — POCT I-STAT CREATININE: CREATININE: 0.9 mg/dL (ref 0.44–1.00)

## 2015-07-01 MED ORDER — METRONIDAZOLE 500 MG PO TABS: 500.0000 mg | ORAL_TABLET | Freq: Three times a day (TID) | ORAL | Status: DC

## 2015-07-01 MED ORDER — IOHEXOL 300 MG/ML  SOLN
100.0000 mL | Freq: Once | INTRAMUSCULAR | Status: AC | PRN
Start: 2015-07-01 — End: 2015-07-01
  Administered 2015-07-01: 100 mL via INTRAVENOUS

## 2015-07-01 MED ORDER — CIPROFLOXACIN HCL 500 MG PO TABS: 500.0000 mg | ORAL_TABLET | Freq: Two times a day (BID) | ORAL | Status: DC

## 2015-07-01 NOTE — Progress Notes (Signed)
Subjective:  Patient ID: Karen Parsons, female    DOB: 1941-06-11  Age: 75 y.o. MRN: [id number]  CC: Abdominal pain  HPI:  75 year old female presents for an acute visit with complaints of abdominal pain.  Abdominal pain  Patient developed abdominal pain on Saturday and has continued to persist.  She states the pain is most notable when she palpates the area.  Pain is located in the lower abdomen (bilaterally).  Mild-moderate in severity.   She denies any associated symptoms: fever, chills, N/V, diarrhea, constipation, hematochezia, melena.  She took some tylenol with not improvement.  Exacerbated by palpation.  Social Hx   Social History   Social History  . Marital Status: Widowed    Spouse Name: N/A  . Number of Children: N/A  . Years of Education: N/A   Social History Main Topics  . Smoking status: Former Smoker    Quit date: 06/22/1968  . Smokeless tobacco: Never Used  . Alcohol Use: 1.5 oz/week    3 Standard drinks or equivalent per week     Comment: wine x1 a night  . Drug Use: No  . Sexual Activity: Not Asked   Other Topics Concern  . None   Social History Narrative   Review of Systems  Constitutional: Negative for fever.  Gastrointestinal: Negative for nausea, vomiting, diarrhea, constipation and blood in stool.  Genitourinary: Negative for dysuria.    Objective:  BP 126/84 mmHg  Pulse 66  Temp(Src) 97.7 F (36.5 C) (Oral)  Ht '5\' 3"'$  (1.6 m)  Wt 117 lb 4 oz (53.184 kg)  BMI 20.78 kg/m2  SpO2 97%  BP/Weight 07/01/2015 05/23/2015 0/63/0160  Systolic BP 109 323 557  Diastolic BP 84 93 82  Wt. (Lbs) 117.25 114 117  BMI 20.78 20.2 20.73   Physical Exam  Constitutional: She appears well-developed. No distress.  Eyes: No scleral icterus.  Cardiovascular: Normal rate and regular rhythm.   Pulmonary/Chest: Effort normal and breath sounds normal.  Abdominal: Soft.  Tender to palpation in the LLQ and RLQ (LLQ>RLQ). No rebound or guarding.    Neurological: She is alert.  Psychiatric: She has a normal mood and affect.  Vitals reviewed.  Lab Results  Component Value Date   WBC 5.6 04/17/2014   HGB 13.6 04/17/2014   HCT 42.0 04/17/2014   PLT 257.0 04/17/2014   GLUCOSE 92 11/07/2014   CHOL 293* 04/17/2014   TRIG 56.0 04/17/2014   HDL 85.30 04/17/2014   LDLCALC 197* 04/17/2014   ALT 16 11/07/2014   AST 22 11/07/2014   NA 138 11/07/2014   K 4.0 11/07/2014   CL 102 11/07/2014   CREATININE 0.90 07/01/2015   BUN 22 11/07/2014   CO2 30 11/07/2014   TSH 1.24 11/07/2014    Assessment & Plan:   Problem List Items Addressed This Visit    Adnexal mass    Noted on CT today. Thought to be separate from diverticulitis (not developing abscess). Will need [place] to assess. Will place order and have scheduled this week.      Acute diverticulitis - Primary    New problem. Given history and exam today, CT scan was obtained.  It revealed sigmoid diverticulitis. Additionally it revealed an adnexal mass (in close proximity to the sigmoid colon).  I discussed this with the radiologist (Dr. Lin Landsman) as well as the on-call surgeon (Dr. Burt Knack). Both felt that this was separate from the diverticulitis and not a developing abscess. Treating with Cipro and Flagyl. Patient will  need follow up later this week.       Other Visit Diagnoses    Lower abdominal pain        Relevant Orders    CBC    Comp Met (CMET)    Lipase    POCT Urinalysis Dipstick (Completed)    CT Abdomen Pelvis W Contrast (Completed)    Urine culture      Follow-up: Later this week.  [place]

## 2015-07-01 NOTE — Assessment & Plan Note (Addendum)
New problem. Given history and exam today, CT scan was obtained.  It revealed sigmoid diverticulitis. Additionally it revealed an adnexal mass (in close proximity to the sigmoid colon).  I discussed this with the radiologist (Dr. Lin Landsman) as well as the on-call surgeon (Dr. Burt Knack). Both felt that this was separate from the diverticulitis and not a developing abscess. Treating with Cipro and Flagyl. Patient will need follow up later this week.

## 2015-07-01 NOTE — Telephone Encounter (Signed)
Patient Name: Karen Parsons  DOB: 03-02-1941    Initial Comment Caller states, starting on Sat, she has tenderness / pain in her upper and lower abd , it gets worse if she presses on it ; Dr Derrel Nip    Nurse Assessment  Nurse: Arnetha Courser, RN, Susie Date/Time (Eastern Time): 07/01/2015 12:25:53 PM  Confirm and document reason for call. If symptomatic, describe symptoms. ---on Saturday; started in lower abdomen and extended to pelvis and today it is the upper section of abdomen also; no nausea, diarrhea or chills; pain is mostly a tenderness - increased on left side below naval; last bowel movement yesterday - has not had a larger one in five days; tenderness/pain is a 4 or 5/10; is tolerable;  Has the patient traveled out of the country within the last 30 days? ---No  Does the patient have any new or worsening symptoms? ---Yes  Will a triage be completed? ---Yes  Related visit to physician within the last 2 weeks? ---N/A  Does the PT have any chronic conditions? (i.e. diabetes, asthma, etc.) ---No  Is this a behavioral health or substance abuse call? ---No     Guidelines    Guideline Title Affirmed Question Affirmed Notes  Abdominal Pain - Female [1] MILD-MODERATE pain AND [2] constant AND [3] present > 2 hours    Final Disposition User   See Physician within 4 Hours (or PCP triage) Wisdom, RN, Susie    Referrals  REFERRED TO PCP OFFICE  REFERRED TO PCP OFFICE   Disagree/Comply: Comply

## 2015-07-01 NOTE — Assessment & Plan Note (Signed)
Noted on CT today. Thought to be separate from diverticulitis (not developing abscess). Will need [place] to assess. Will place order and have scheduled this week.

## 2015-07-01 NOTE — Progress Notes (Signed)
Pre visit review using our clinic review tool, if applicable. No additional management support is needed unless otherwise documented below in the visit note. 

## 2015-07-02 ENCOUNTER — Telehealth: Payer: Self-pay | Admitting: Internal Medicine

## 2015-07-02 ENCOUNTER — Other Ambulatory Visit: Payer: Self-pay

## 2015-07-02 MED ORDER — CIPROFLOXACIN HCL 500 MG PO TABS: 500.0000 mg | ORAL_TABLET | Freq: Two times a day (BID) | ORAL | Status: DC

## 2015-07-02 NOTE — Telephone Encounter (Signed)
Patient Name: Karen Parsons  DOB: 1940/07/08    Initial Comment caller states Dr Lacinda Axon told her to pick up a script he wants her on before her surgery in 9 days, but pharm doesn't have it   Nurse Assessment  Nurse: Leilani Merl, RN, Heather Date/Time (Eastern Time): 07/02/2015 9:13:16 AM  Confirm and document reason for call. If symptomatic, describe symptoms. ---caller states Dr Lacinda Axon told her to pick up a script he wants her on before her surgery in 9 days, but pharm doesn't have it. [facility], she would like the meds sent to [facility] on [address]. The medication is cipro and flagyl.  Has the patient traveled out of the country within the last 30 days? ---Not Applicable  Does the patient have any new or worsening symptoms? ---No     Guidelines    Guideline Title Affirmed Question Affirmed Notes       Final Disposition User   Send To Clinical Follow Up Jeral Fruit

## 2015-07-03 ENCOUNTER — Telehealth: Payer: Self-pay | Admitting: Internal Medicine

## 2015-07-03 ENCOUNTER — Ambulatory Visit: Payer: Medicare Other | Admitting: Family Medicine

## 2015-07-03 LAB — URINE CULTURE
COLONY COUNT: NO GROWTH
Organism ID, Bacteria: NO GROWTH

## 2015-07-03 NOTE — Telephone Encounter (Signed)
Patient was called and had an appointment scheduled to follow up with Dr.Cook. Patient cancelled the appointment despite Dr.Cook advise to follow up on her CT scan. Patient cancelled Ultrasound despite the advise of Dr.Cook.  Patient cancelled these appointment due to her wanting her Gi Doctor Vira Agar to look over her CT scan and see what he advised.

## 2015-07-05 ENCOUNTER — Ambulatory Visit: Payer: Medicare Other

## 2015-07-11 ENCOUNTER — Ambulatory Visit: Payer: Medicare Other | Admitting: Anesthesiology

## 2015-07-11 ENCOUNTER — Ambulatory Visit
Admission: RE | Admit: 2015-07-11 | Discharge: 2015-07-11 | Disposition: A | Discharge status: Home / Self Care | Payer: Medicare Other | Source: Ambulatory Visit | Attending: Gastroenterology | Admitting: Gastroenterology

## 2015-07-11 ENCOUNTER — Encounter
Admission: RE | Disposition: A | Discharge status: Home / Self Care | Payer: Self-pay | Source: Ambulatory Visit | Attending: Gastroenterology

## 2015-07-11 DIAGNOSIS — E785 Hyperlipidemia, unspecified: Secondary | ICD-10-CM | POA: Insufficient documentation

## 2015-07-11 DIAGNOSIS — K2270 Barrett's esophagus without dysplasia: Secondary | ICD-10-CM | POA: Diagnosis not present

## 2015-07-11 DIAGNOSIS — Z882 Allergy status to sulfonamides status: Secondary | ICD-10-CM | POA: Insufficient documentation

## 2015-07-11 DIAGNOSIS — Z86010 Personal history of colonic polyps: Secondary | ICD-10-CM | POA: Insufficient documentation

## 2015-07-11 DIAGNOSIS — K449 Diaphragmatic hernia without obstruction or gangrene: Secondary | ICD-10-CM | POA: Insufficient documentation

## 2015-07-11 DIAGNOSIS — K219 Gastro-esophageal reflux disease without esophagitis: Secondary | ICD-10-CM | POA: Diagnosis not present

## 2015-07-11 DIAGNOSIS — Z87891 Personal history of nicotine dependence: Secondary | ICD-10-CM | POA: Insufficient documentation

## 2015-07-11 HISTORY — PX: ESOPHAGOGASTRODUODENOSCOPY (EGD) WITH PROPOFOL: SHX5813

## 2015-07-11 SURGERY — ESOPHAGOGASTRODUODENOSCOPY (EGD) WITH PROPOFOL
Anesthesia: General

## 2015-07-11 MED ORDER — STERILE WATER FOR INJECTION IJ SOLN
Freq: Once | RESPIRATORY_TRACT | Status: DC
  Filled 2015-07-11: qty 3

## 2015-07-11 MED ORDER — SODIUM CHLORIDE 0.9 % IV SOLN: INTRAVENOUS | Status: DC

## 2015-07-11 MED ORDER — PROPOFOL 10 MG/ML IV BOLUS
INTRAVENOUS | Status: DC | PRN
  Administered 2015-07-11: 80 mg via INTRAVENOUS

## 2015-07-11 MED ORDER — FENTANYL CITRATE (PF) 100 MCG/2ML IJ SOLN
INTRAMUSCULAR | Status: DC | PRN
  Administered 2015-07-11: 50 ug via INTRAVENOUS

## 2015-07-11 MED ORDER — LACTATED RINGERS IV SOLN
INTRAVENOUS | Status: DC | PRN
  Administered 2015-07-11: 08:00:00 via INTRAVENOUS

## 2015-07-11 MED ORDER — MIDAZOLAM HCL 2 MG/2ML IJ SOLN
INTRAMUSCULAR | Status: DC | PRN
  Administered 2015-07-11: 1 mg via INTRAVENOUS

## 2015-07-11 MED ORDER — SODIUM CHLORIDE 0.9 % IV SOLN
INTRAVENOUS | Status: DC
  Administered 2015-07-11: 1000 mL via INTRAVENOUS

## 2015-07-11 MED ORDER — PROPOFOL 500 MG/50ML IV EMUL
INTRAVENOUS | Status: DC | PRN
  Administered 2015-07-11: 60 ug/kg/min via INTRAVENOUS

## 2015-07-11 NOTE — H&P (Signed)
Primary Care Physician:  Crecencio Mc, MD Primary Gastroenterologist:  Dr. Candace Cruise  Pre-Procedure History & Physical: HPI:  Karen Parsons is a 75 y.o. female is here for an EGD with Barrx.   Past Medical History  Diagnosis Date  . Hyperlipidemia     intolerant of statins, normal diagnostic cath July 2010 Nehemiah Massed)  . GERD (gastroesophageal reflux disease)   . Osteoporosis     by DEXA 2008  . S/P colonoscopy 2011    [name], polyp removed  . Barrett's esophagus July 2010    Wohl,   . Hyperlipidemia   . Cramp of limb   . Hemorrhoids   . 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 Hallux valgus   . Colon polyps   . History of hiatal hernia   . Genital prolapse   . Barrett esophagus   . Benign neoplasm of colon     Past Surgical History  Procedure Laterality Date  . Abdominal hysterectomy      age 44, menorrhagia  . Cardiac catheterization Left July 2010    [name]:  normal  . Medial meniscotomy Left 2007    [name]  . Osteotomy toes Right 2008    [name]  . Cataract extraction Left 2010    [name]  . Arthroscopy of knee Right   . Tonsillectomy    . Colonoscopy with propofol N/A 01/07/2015    Procedure: COLONOSCOPY WITH PROPOFOL;  Surgeon: Manya Silvas, MD;  Location: [facility] ENDOSCOPY;  Service: Endoscopy;  Laterality: N/A;  . Esophagogastroduodenoscopy N/A 01/07/2015    Procedure: ESOPHAGOGASTRODUODENOSCOPY (EGD);  Surgeon: Manya Silvas, MD;  Location: [facility] ENDOSCOPY;  Service: Endoscopy;  Laterality: N/A;  . Esophagogastroduodenoscopy (egd) with propofol N/A 05/23/2015    Procedure: ESOPHAGOGASTRODUODENOSCOPY (EGD) WITH PROPOFOL;  Surgeon: Manya Silvas, MD;  Location[name] ENDOSCOPY;  Service: Endoscopy;  Laterality: N/A;    Prior to Admission medications   Medication Sig Start Date End Date Taking? Authorizing Provider  ciprofloxacin (CIPRO) 500 MG tablet Take 1 tablet (500 mg total) by mouth 2 (two) times daily. 07/02/15  Yes Jayce G Cook, DO   ipratropium (ATROVENT) 0.03 % nasal spray PLACE 2 SPRAYS INTO BOTH NOSTRILS 2 (TWO) TIMES DAILY. 01/28/15  Yes Crecencio Mc, MD  meloxicam (MOBIC) 7.5 MG tablet Take 1 tablet (7.5 mg total) by mouth daily. 03/14/15 03/13/16 Yes Johnn Hai, PA-C  metroNIDAZOLE (FLAGYL) 500 MG tablet Take 1 tablet (500 mg total) by mouth 3 (three) times daily. 07/01/15  Yes Coral Spikes, DO  omeprazole (PRILOSEC) 20 MG capsule Take 20 mg by mouth daily.     Yes Historical Provider, MD    Allergies as of 06/11/2015 - Review Complete 05/23/2015  Allergen Reaction Noted  . Sulfamethoxazole-trimethoprim Nausea Only 04/17/2014    Family History  Problem Relation Age of Onset  . Hypertension Mother   . Heart disease Father   . Heart disease Brother     Social History   Social History  . Marital Status: Widowed    Spouse Name: N/A  . Number of Children: N/A  . Years of Education: N/A   Occupational History  . Not on file.   Social History Main Topics  . Smoking status: Former Smoker    Quit date: 06/22/1968  . Smokeless tobacco: Never Used  . Alcohol Use: 1.5 oz/week    3 Standard drinks or equivalent per week     Comment: wine x1 a night  . Drug Use: No  .  Sexual Activity: Not on file   Other Topics Concern  . Not on file   Social History Narrative    Review of Systems: See HPI, otherwise negative ROS  Physical Exam: BP 129/97 mmHg  Pulse 77  Temp(Src) 96.7 F (35.9 C) (Tympanic)  Resp 16  Ht 5\' 3"  (1.6 m)  Wt 51.71 kg (114 lb)  BMI 20.20 kg/m2  SpO2 100% General:   Alert,  pleasant and cooperative in NAD Head:  Normocephalic and atraumatic. Neck:  Supple; no masses or thyromegaly. Lungs:  Clear throughout to auscultation.    Heart:  Regular rate and rhythm. Abdomen:  Soft, nontender and nondistended. Normal bowel sounds, without guarding, and without rebound.   Neurologic:  Alert and  oriented x4;  grossly normal neurologically.  Impression/Plan: Karen Parsons is here  for an EGD to be performed for  Barrx with low grade dysplasia.  Risks, benefits, limitations, and alternatives regarding EGD have been reviewed with the patient.  Questions have been answered.  All parties agreeable.   Lakiya Cottam, Lupita Dawn, MD  07/11/2015, 7:57 AM

## 2015-07-11 NOTE — Anesthesia Postprocedure Evaluation (Signed)
Anesthesia Post Note  Patient: Karen Parsons  Procedure(s) Performed: Procedure(s) (LRB): ESOPHAGOGASTRODUODENOSCOPY (EGD) WITH PROPOFOL (N/A)  Patient location during evaluation: PACU Anesthesia Type: General Level of consciousness: awake and alert Pain management: pain level controlled Vital Signs Assessment: post-procedure vital signs reviewed and stable Respiratory status: spontaneous breathing and respiratory function stable Cardiovascular status: stable Anesthetic complications: no    Last Vitals:  Filed Vitals:   07/11/15 0832 07/11/15 0833  BP: 121/72   Pulse: 60   Temp: 36.2 C 35.7 C  Resp: 16     Last Pain: There were no vitals filed for this visit.               KEPHART,WILLIAM K

## 2015-07-11 NOTE — Anesthesia Preprocedure Evaluation (Signed)
Anesthesia Evaluation  Patient identified by MRN, date of birth, ID band Patient awake    Reviewed: Allergy & Precautions, NPO status , Patient's Chart, lab work & pertinent test results  History of Anesthesia Complications Negative for: history of anesthetic complications  Airway Mallampati: II       Dental  (+) Teeth Intact   Pulmonary neg pulmonary ROS, former smoker,           Cardiovascular + angina      Neuro/Psych negative neurological ROS     GI/Hepatic Neg liver ROS, hiatal hernia, GERD  Medicated and Controlled,  Endo/Other  negative endocrine ROS  Renal/GU negative Renal ROS     Musculoskeletal   Abdominal   Peds  Hematology negative hematology ROS (+)   Anesthesia Other Findings   Reproductive/Obstetrics                             Anesthesia Physical Anesthesia Plan  ASA: II  Anesthesia Plan: General   Post-op Pain Management:    Induction: Intravenous  Airway Management Planned:   Additional Equipment:   Intra-op Plan:   Post-operative Plan:   Informed Consent: I have reviewed the patients History and Physical, chart, labs and discussed the procedure including the risks, benefits and alternatives for the proposed anesthesia with the patient or authorized representative who has indicated his/her understanding and acceptance.     Plan Discussed with:   Anesthesia Plan Comments:         Anesthesia Quick Evaluation

## 2015-07-11 NOTE — Op Note (Signed)
[facility] Gastroenterology Patient Name: Karen Parsons Procedure Date: 07/11/2015 8:04 AM MRN: [id number] Account #: [id number] Date of Birth: [birth year] Admit Type: Outpatient Age: 75 Room: [facility] ENDO ROOM 4 Gender: Female Note Status: Finalized Procedure:         Upper GI endoscopy Indications:       Barrett's esophagus, , indefinite for low grade dysplasia Providers:         Lupita Dawn. Candace Cruise, MD Referring MD:      Deborra Medina, MD (Referring MD) Medicines:         Monitored Anesthesia Care Complications:     No immediate complications. Procedure:         Pre-Anesthesia Assessment:                    - Prior to the procedure, a History and Physical was                     performed, and patient medications, allergies and                     sensitivities were reviewed. The patient's tolerance of                     previous anesthesia was reviewed.                    - The risks and benefits of the procedure and the sedation                     options and risks were discussed with the patient. All                     questions were answered and informed consent was obtained.                    - After reviewing the risks and benefits, the patient was                     deemed in satisfactory condition to undergo the procedure.                    After obtaining informed consent, the endoscope was passed                     under direct vision. Throughout the procedure, the                     patient's blood pressure, pulse, and oxygen saturations                     were monitored continuously. The Endoscope was introduced                     through the mouth, and advanced to the second part of                     duodenum. The upper GI endoscopy was accomplished without                     difficulty. The patient tolerated the procedure well. Findings:      The esophagus and gastroesophageal junction were examined with Tyer       light and narrow band imaging  (NBI) from a forward view and retroflexed  position. There were esophageal mucosal changes consistent with       long-segment Barrett's esophagus. These changes involved the mucosa at       the upper extent of the gastric folds (34 cm from the incisors)       extending to the Z-line (31 cm from the incisors). Salmon-colored mucosa       was present. The maximum longitudinal extent of these esophageal mucosal       changes was 3 cm in length. Focal radiofrequency ablation of Barrett's       esophagus was performed. With the endoscope in place, the position and       extent of the Barrett's mucosa and the anatomic landmarks including       proximal and distal extent of Barrett's mucosa were noted. The Barrett's       mucosa was irrigated with N-acetylcysteine (Mucomyst) 1% mixed with       water. Esophageal contents were suctioned. The endoscope was then       removed from the patient. The Barrx-90 Ultra radiofrequency ablation       catheter was attached to the tip of the endoscope. The endoscope with       the attached radiofrequency ablation catheter was then passed       transorally under direct vision into the esophagus and advanced to the       areas of Barrett's mucosa. The areas included tongues of Barrett's       mucosa. The radiofrequency ablation catheter was placed in contact with       the surface of the Barrett's mucosa under direct visualization and       energy was applied one at 12 J/cm2. Ablation was repeated in a likewise       fashion to the entire area of suspected Barrett's mucosa. The ablation       zone was cleaned of coagulative debris. The ablation catheter and       endoscope were then removed and the catheter was cleaned. The catheter       and endoscope were reinserted into the esophagus. A second round of       ablation was then performed. Energy was applied once at 12 J/cm2 to       retreat the areas of Barrett's epithelium that had been treated with the        first series of ablation. The areas of the esophagus where Barrett's       mucosa had been ablated were carefully examined. Total of 22 treatments       were applied.      The exam was otherwise without abnormality.      A medium-sized hiatus hernia was present.      The exam was otherwise without abnormality.      The examined duodenum was normal. Impression:        - Esophageal mucosal changes consistent with long-segment                     Barrett's esophagus. Treated with radiofrequency ablation.                    - The examination was otherwise normal.                    - Medium-sized hiatus hernia.                    -  The examination was otherwise normal.                    - Normal examined duodenum.                    - No specimens collected. Recommendation:    - Discharge patient to home.                    - Full liquid diet today.                    - The findings and recommendations were discussed with the                     patient.                    - Continue present medications.                    - GI cocktail and tylenol with codein elixir for pain                     control. Procedure Code(s): --- Professional ---                    [id number], Esophagogastroduodenoscopy, flexible, transoral;                     with ablation of tumor(s), polyp(s), or other lesion(s)                     (includes pre- and post-dilation and guide wire passage,                     when performed) Diagnosis Code(s): --- Professional ---                    K22.70, Barrett's esophagus without dysplasia                    K44.9, Diaphragmatic hernia without obstruction or gangrene CPT copyright 2014 American Medical Association. All rights reserved. The codes documented in this report are preliminary and upon coder review may  be revised to meet current compliance requirements. [name], MD 07/11/2015 8:35:38 AM This report has been signed electronically. Number of Addenda:  0 Note Initiated On: 07/11/2015 8:04 AM      [facility]

## 2015-07-11 NOTE — Transfer of Care (Signed)
Immediate Anesthesia Transfer of Care Note  Patient: Karen Parsons  Procedure(s) Performed: Procedure(s): ESOPHAGOGASTRODUODENOSCOPY (EGD) WITH PROPOFOL (N/A)  Patient Location: PACU  Anesthesia Type:General  Level of Consciousness: patient cooperative and lethargic  Airway & Oxygen Therapy: Patient Spontanous Breathing and Patient connected to nasal cannula oxygen  Post-op Assessment: Report given to RN and Post -op Vital signs reviewed and stable  Post vital signs: Reviewed and stable  Last Vitals:  Filed Vitals:   07/11/15 0711 07/11/15 0832  BP: 129/97 121/72  Pulse: 77 60  Temp: 35.9 C 36.2 C  Resp: 16 16    Complications: No apparent anesthesia complications

## 2015-07-15 ENCOUNTER — Encounter: Payer: Self-pay | Admitting: Gastroenterology

## 2015-07-18 ENCOUNTER — Ambulatory Visit: Payer: Medicare Other | Admitting: Internal Medicine

## 2015-07-29 ENCOUNTER — Encounter: Payer: Self-pay | Admitting: Family Medicine

## 2015-07-29 ENCOUNTER — Ambulatory Visit (INDEPENDENT_AMBULATORY_CARE_PROVIDER_SITE_OTHER): Payer: Medicare Other | Admitting: Family Medicine

## 2015-07-29 VITALS — BP 102/76 | HR 81 | Temp 98.3°F | Ht 63.0 in | Wt 114.5 lb

## 2015-07-29 DIAGNOSIS — M79646 Pain in unspecified finger(s): Secondary | ICD-10-CM | POA: Insufficient documentation

## 2015-07-29 DIAGNOSIS — M79644 Pain in right finger(s): Secondary | ICD-10-CM | POA: Diagnosis not present

## 2015-07-29 NOTE — Progress Notes (Signed)
   Subjective:  Patient ID: Karen Parsons, female    DOB: 1941-02-04  Age: 75 y.o. MRN: [id number]  CC: Thumb pain  HPI:  75 year old female presents with complaints of right thumb pain.  Right thumb pain  Has been ongoing for the past several months.  She has been seen in the emergency department, urgent care, and by orthopedics for this.  She has been diagnosed with a trigger thumb and has been treated with anti-inflammatories in addition to receiving an injection.  She presents today with continued pain at the first MCP.  She also reports pain at the IP joint and locking associated with the trigger thumb.  She is not currently taking any medications.  She reports continued pain.  Exacerbated by range of motion. No known relieving factors.  Social Hx   Social History   Social History  . Marital Status: Widowed    Spouse Name: N/A  . Number of Children: N/A  . Years of Education: N/A   Social History Main Topics  . Smoking status: Former Smoker    Quit date: 06/22/1968  . Smokeless tobacco: Never Used  . Alcohol Use: 1.5 oz/week    3 Standard drinks or equivalent per week     Comment: wine x1 a night  . Drug Use: No  . Sexual Activity: Not Asked   Other Topics Concern  . None   Social History Narrative   Review of Systems  Constitutional: Negative.   Musculoskeletal:       Thumb pain, right.   Objective:  BP 102/76 mmHg  Pulse 81  Temp(Src) 98.3 F (36.8 C) (Oral)  Ht 5\' 3"  (1.6 m)  Wt 114 lb 8 oz (51.937 kg)  BMI 20.29 kg/m2  SpO2 96%  BP/Weight 07/29/2015 [id number]  Systolic BP [id number] [id number]  Diastolic BP 76 94 84  Wt. (Lbs) 114.5 114 117.25  BMI 20.29 20.2 20.78   Physical Exam  Constitutional: She is oriented to person, place, and time. She appears well-developed. No distress.  Pulmonary/Chest: Effort normal.  Musculoskeletal:  Right thumb - mild swelling at the MCP joint. Tender to palpation. Trigger thumb noted.  Neurological: She  is alert and oriented to person, place, and time.  Psychiatric: She has a normal mood and affect.  Vitals reviewed.  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 Advised Tylenol as needed. Offered referral to hand surgery given the fact that she seen orthopedics with no improvement. Patient will like to hold off and see how she does with tylenol.         Follow-up: PRN  [place]

## 2015-07-29 NOTE — Patient Instructions (Signed)
Use tylenol as needed three times daily (up to 4000 mg daily).  Call if you would like to see a hand surgeon.  Take care  Dr. Lacinda Axon Trigger Finger Trigger finger (digital tendinitis and stenosing tenosynovitis) is a common disorder that causes an often painful catching of the fingers or thumb. It occurs as a clicking, snapping, or locking of a finger in the palm of the hand. This is caused by a problem with the tendons that flex or bend the fingers sliding smoothly through their sheaths. The condition may occur in any finger or a couple fingers at the same time.  The finger may lock with the finger curled or suddenly straighten out with a snap. This is more common in patients with rheumatoid arthritis and diabetes. Left untreated, the condition may get worse to the point where the finger becomes locked in flexion, like making a fist, or less commonly locked with the finger straightened out. CAUSES   Inflammation and scarring that lead to swelling around the tendon sheath.  Repeated or forceful movements.  Rheumatoid arthritis, an autoimmune disease that affects joints.  Gout.  Diabetes mellitus. SIGNS AND SYMPTOMS  Soreness and swelling of your finger.  A painful clicking or snapping as you bend and straighten your finger. DIAGNOSIS  Your health care provider will do a physical exam of your finger to diagnose trigger finger. TREATMENT   Splinting for 6-8 weeks may be helpful.  Nonsteroidal anti-inflammatory medicines (NSAIDs) can help to relieve the pain and inflammation.  Cortisone injections, along with splinting, may speed up recovery. Several injections may be required. Cortisone may give relief after one injection.  Surgery is another treatment that may be used if conservative treatments do not work. Surgery can be minor, without incisions (a cut does not have to be made), and can be done with a needle through the skin.  Other surgical choices involve an open procedure in  which the surgeon opens the hand through a small incision and cuts the pulley so the tendon can again slide smoothly. Your hand will still work fine. HOME CARE INSTRUCTIONS  Apply ice to the injured area, twice per day:  Put ice in a plastic bag.  Place a towel between your skin and the bag.  Leave the ice on for 20 minutes, 3-4 times a day.  Rest your hand often. MAKE SURE YOU:   Understand these instructions.  Will watch your condition.  Will get help right away if you are not doing well or get worse.   This information is not intended to replace advice given to you by your health care provider. Make sure you discuss any questions you have with your health care provider.   Document Released: 03/28/2004 Document Revised: 02/08/2013 Document Reviewed: 11/08/2012 Elsevier Interactive Patient Education [employer].

## 2015-07-29 NOTE — Assessment & Plan Note (Signed)
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 Advised Tylenol as needed. Offered referral to hand surgery given the fact that she seen orthopedics with no improvement. Patient will like to hold off and see how she does with tylenol.

## 2015-07-29 NOTE — Progress Notes (Signed)
Pre visit review using our clinic review tool, if applicable. No additional management support is needed unless otherwise documented below in the visit note. 

## 2015-08-01 DIAGNOSIS — N83202 Unspecified ovarian cyst, left side: Secondary | ICD-10-CM | POA: Insufficient documentation

## 2015-08-01 DIAGNOSIS — M159 Polyosteoarthritis, unspecified: Secondary | ICD-10-CM | POA: Insufficient documentation

## 2015-08-01 DIAGNOSIS — N281 Cyst of kidney, acquired: Secondary | ICD-10-CM | POA: Insufficient documentation

## 2015-08-01 DIAGNOSIS — I700 Atherosclerosis of aorta: Secondary | ICD-10-CM | POA: Insufficient documentation

## 2015-09-11 ENCOUNTER — Encounter: Payer: Self-pay | Admitting: *Deleted

## 2015-09-12 ENCOUNTER — Ambulatory Visit
Admission: RE | Admit: 2015-09-12 | Discharge: 2015-09-12 | Disposition: A | Discharge status: Home / Self Care | Payer: Medicare Other | Source: Ambulatory Visit | Attending: Gastroenterology | Admitting: Gastroenterology

## 2015-09-12 ENCOUNTER — Ambulatory Visit: Payer: Medicare Other | Admitting: Anesthesiology

## 2015-09-12 ENCOUNTER — Encounter: Payer: Self-pay | Admitting: *Deleted

## 2015-09-12 ENCOUNTER — Encounter
Admission: RE | Disposition: A | Discharge status: Home / Self Care | Payer: Self-pay | Source: Ambulatory Visit | Attending: Gastroenterology

## 2015-09-12 DIAGNOSIS — Z8249 Family history of ischemic heart disease and other diseases of the circulatory system: Secondary | ICD-10-CM | POA: Insufficient documentation

## 2015-09-12 DIAGNOSIS — Z90710 Acquired absence of both cervix and uterus: Secondary | ICD-10-CM | POA: Insufficient documentation

## 2015-09-12 DIAGNOSIS — Z79899 Other long term (current) drug therapy: Secondary | ICD-10-CM | POA: Diagnosis not present

## 2015-09-12 DIAGNOSIS — Z87891 Personal history of nicotine dependence: Secondary | ICD-10-CM | POA: Diagnosis not present

## 2015-09-12 DIAGNOSIS — M810 Age-related osteoporosis without current pathological fracture: Secondary | ICD-10-CM | POA: Insufficient documentation

## 2015-09-12 DIAGNOSIS — M159 Polyosteoarthritis, unspecified: Secondary | ICD-10-CM | POA: Diagnosis not present

## 2015-09-12 DIAGNOSIS — Z9842 Cataract extraction status, left eye: Secondary | ICD-10-CM | POA: Diagnosis not present

## 2015-09-12 DIAGNOSIS — K219 Gastro-esophageal reflux disease without esophagitis: Secondary | ICD-10-CM | POA: Insufficient documentation

## 2015-09-12 DIAGNOSIS — K22710 Barrett's esophagus with low grade dysplasia: Secondary | ICD-10-CM | POA: Diagnosis not present

## 2015-09-12 DIAGNOSIS — N83202 Unspecified ovarian cyst, left side: Secondary | ICD-10-CM | POA: Diagnosis not present

## 2015-09-12 DIAGNOSIS — E785 Hyperlipidemia, unspecified: Secondary | ICD-10-CM | POA: Insufficient documentation

## 2015-09-12 DIAGNOSIS — I200 Unstable angina: Secondary | ICD-10-CM | POA: Diagnosis not present

## 2015-09-12 DIAGNOSIS — K2270 Barrett's esophagus without dysplasia: Secondary | ICD-10-CM | POA: Diagnosis present

## 2015-09-12 DIAGNOSIS — Z86010 Personal history of colonic polyps: Secondary | ICD-10-CM | POA: Insufficient documentation

## 2015-09-12 HISTORY — PX: ESOPHAGOGASTRODUODENOSCOPY (EGD) WITH PROPOFOL: SHX5813

## 2015-09-12 HISTORY — DX: Cyst of kidney, acquired: N28.1

## 2015-09-12 HISTORY — DX: Polyosteoarthritis, unspecified: M15.9

## 2015-09-12 HISTORY — DX: Unspecified ovarian cyst, left side: N83.202

## 2015-09-12 SURGERY — ESOPHAGOGASTRODUODENOSCOPY (EGD) WITH PROPOFOL
Anesthesia: General

## 2015-09-12 MED ORDER — SODIUM CHLORIDE 0.9 % IV SOLN: INTRAVENOUS | Status: DC

## 2015-09-12 MED ORDER — STERILE WATER FOR INJECTION IJ SOLN
Freq: Once | RESPIRATORY_TRACT | Status: AC
  Administered 2015-09-12: 60 mL via OROMUCOSAL
  Filled 2015-09-12: qty 3

## 2015-09-12 MED ORDER — ALFENTANIL 500 MCG/ML IJ INJ
INJECTION | INTRAMUSCULAR | Status: DC | PRN
  Administered 2015-09-12: 500 ug via INTRAVENOUS

## 2015-09-12 MED ORDER — SODIUM CHLORIDE 0.9 % IV SOLN
INTRAVENOUS | Status: DC
  Administered 2015-09-12: 08:00:00 via INTRAVENOUS

## 2015-09-12 MED ORDER — PROPOFOL 10 MG/ML IV BOLUS
INTRAVENOUS | Status: DC | PRN
  Administered 2015-09-12: 70 mg via INTRAVENOUS

## 2015-09-12 MED ORDER — PROPOFOL 500 MG/50ML IV EMUL
INTRAVENOUS | Status: DC | PRN
  Administered 2015-09-12: 100 ug/kg/min via INTRAVENOUS

## 2015-09-12 NOTE — Transfer of Care (Signed)
Immediate Anesthesia Transfer of Care Note  Patient: Karen Parsons  Procedure(s) Performed: Procedure(s): ESOPHAGOGASTRODUODENOSCOPY (EGD) WITH PROPOFOL (N/A)  Patient Location: PACU and Endoscopy Unit  Anesthesia Type:General  Level of Consciousness: awake, alert  and oriented  Airway & Oxygen Therapy: Patient Spontanous Breathing and Patient connected to nasal cannula oxygen  Post-op Assessment: Report given to RN and Post -op Vital signs reviewed and stable  Post vital signs: Reviewed and stable  Last Vitals:  Filed Vitals:   09/12/15 0728 09/12/15 0825  BP: 147/91 117/70  Pulse: 70 60  Temp: 36.2 C 35.8 C  Resp: 18 12    Complications: No apparent anesthesia complications

## 2015-09-12 NOTE — Op Note (Signed)
[facility] Gastroenterology Patient Name: Karen Parsons Procedure Date: 09/12/2015 7:52 AM MRN: [id number] Account #: [id number] Date of Birth: 08-Sep-1940 Admit Type: Outpatient Age: 75 Room: [facility] ENDO ROOM 4 Gender: Female Note Status: Finalized Procedure:            Upper GI endoscopy Indications:          For therapy of Barrett's esophagus, S/P Barrx x 1. Hx                        of indefinite for low grade dysplasia Providers:            Lupita Dawn. Candace Cruise, MD Referring MD:         Ramonita Lab, MD (Referring MD) Medicines:            Monitored Anesthesia Care Complications:        No immediate complications. Procedure:            Pre-Anesthesia Assessment:                       - Prior to the procedure, a History and Physical was                        performed, and patient medications, allergies and                        sensitivities were reviewed. The patient's tolerance of                        previous anesthesia was reviewed.                       - The risks and benefits of the procedure and the                        sedation options and risks were discussed with the                        patient. All questions were answered and informed                        consent was obtained.                       - After reviewing the risks and benefits, the patient                        was deemed in satisfactory condition to undergo the                        procedure.                       After obtaining informed consent, the endoscope was                        passed under direct vision. Throughout the procedure,                        the patient's blood pressure, pulse, and oxygen  saturations were monitored continuously. The                        Colonoscope was introduced through the mouth, and                        advanced to the second part of duodenum. The upper GI                        endoscopy was accomplished without  difficulty. The                        patient tolerated the procedure well. Findings:      The esophagus and gastroesophageal junction were examined with Jaggi       light and narrow band imaging (NBI). There were esophageal mucosal       changes consistent with short-segment Barrett's esophagus. These changes       involved the mucosa at the upper extent of the gastric folds (35 cm from       the incisors) extending to the Z-line (34 cm from the incisors).       Scattered islands of squamous mucosa were present. The maximum       longitudinal extent of these esophageal mucosal changes was 1 cm in       length. Much improved from before. Focal radiofrequency ablation of       Barrett's esophagus was performed. With the endoscope in place, the       position and extent of the Barrett's mucosa and the anatomic landmarks       including proximal and distal extent of Barrett's mucosa were noted.       Endoscopic visualization identified an ablation site. The Barrett's       mucosa was irrigated with N-acetylcysteine (Mucomyst) 1% mixed with       water. Esophageal contents were suctioned. The endoscope was then       removed from the patient. The Barrx-90 radiofrequency ablation catheter       was attached to the tip of the endoscope. The endoscope with the       attached radiofrequency ablation catheter was then passed transorally       under direct vision into the esophagus and advanced to the areas of       Barrett's mucosa. The areas included islands and tongues of Barrett's       mucosa. The radiofrequency ablation catheter was placed in contact with       the surface of the Barrett's mucosa under direct visualization and       energy was applied twice at 12 J/cm2. Ablation was repeated in a       likewise fashion to the entire area of suspected Barrett's mucosa. The       ablation zone was cleaned of coagulative debris. A second round of       ablation was then performed. Energy was  applied twice at 12 J/cm2 to       retreat the areas of Barrett's epithelium that had been treated with the       first series of ablation. The areas of the esophagus where Barrett's       mucosa had been ablated were examined. Total of 24 treatments were given.      The exam was otherwise without abnormality.  The entire examined stomach was normal.      The examined duodenum was normal. Impression:           - Esophageal mucosal changes consistent with                        short-segment Barrett's esophagus. Treated with                        radiofrequency ablation.                       - The examination was otherwise normal.                       - Normal stomach.                       - Normal examined duodenum.                       - No specimens collected. Recommendation:       - Discharge patient to home.                       - Observe patient's clinical course.                       - Repeat upper endoscopy in 2 months for retreatment.                       - The findings and recommendations were discussed with                        the patient.                       - Pain meds prn. Procedure Code(s):    --- Professional ---                       [phone], Esophagogastroduodenoscopy, flexible, transoral;                        with ablation of tumor(s), polyp(s), or other lesion(s)                        (includes pre- and post-dilation and guide wire                        passage, when performed) Diagnosis Code(s):    --- Professional ---                       K22.70, Barrett's esophagus without dysplasia CPT copyright 2016 [employer]. All rights reserved. The codes documented in this report are preliminary and upon coder review may  be revised to meet current compliance requirements. [name], MD 09/12/2015 8:28:33 AM This report has been signed electronically. Number of Addenda: 0 Note Initiated On: 09/12/2015 7:52 AM      [facility]

## 2015-09-12 NOTE — Anesthesia Postprocedure Evaluation (Signed)
Anesthesia Post Note  Patient: Karen Parsons  Procedure(s) Performed: Procedure(s) (LRB): ESOPHAGOGASTRODUODENOSCOPY (EGD) WITH PROPOFOL (N/A)  Patient location during evaluation: PACU Anesthesia Type: General Level of consciousness: awake and alert Pain management: pain level controlled Vital Signs Assessment: post-procedure vital signs reviewed and stable Respiratory status: spontaneous breathing and respiratory function stable Cardiovascular status: stable Anesthetic complications: no    Last Vitals:  Filed Vitals:   09/12/15 0840 09/12/15 0900  BP: 104/63 148/90  Pulse: 57 53  Temp:    Resp: 13 16    Last Pain: There were no vitals filed for this visit.               KEPHART,WILLIAM K

## 2015-09-12 NOTE — H&P (Signed)
Primary Care Physician:  Crecencio Mc, MD Primary Gastroenterologist:  Dr. Candace Cruise  Pre-Procedure History & Physical: HPI:  Karen Parsons is a 75 y.o. female is here for an EGD with Barrx.   Past Medical History  Diagnosis Date  . Hyperlipidemia     intolerant of statins, normal diagnostic cath July 2010 Nehemiah Massed)  . GERD (gastroesophageal reflux disease)   . Osteoporosis     by DEXA 2008  . S/P colonoscopy 2011    [name], polyp removed  . Barrett's esophagus July 2010    Wohl,   . Hyperlipidemia   . Cramp of limb   . Hemorrhoids   . 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 Hallux valgus   . Colon polyps   . History of hiatal hernia   . Genital prolapse   . Barrett esophagus   . Benign neoplasm of colon   . Bilateral renal cysts   . Generalized osteoarthritis   . Left ovarian cyst     Past Surgical History  Procedure Laterality Date  . Abdominal hysterectomy      age 74, menorrhagia  . Cardiac catheterization Left July 2010    [name]:  normal  . Medial meniscotomy Left 2007    [name]  . Osteotomy toes Right 2008    [name]  . Cataract extraction Left 2010    [name]  . Arthroscopy of knee Right   . Tonsillectomy    . Colonoscopy with propofol N/A 01/07/2015    Procedure: COLONOSCOPY WITH PROPOFOL;  Surgeon: Manya Silvas, MD;  Location: [facility] ENDOSCOPY;  Service: Endoscopy;  Laterality: N/A;  . Esophagogastroduodenoscopy N/A 01/07/2015    Procedure: ESOPHAGOGASTRODUODENOSCOPY (EGD);  Surgeon: Manya Silvas, MD;  Location: [facility] ENDOSCOPY;  Service: Endoscopy;  Laterality: N/A;  . Esophagogastroduodenoscopy (egd) with propofol N/A 05/23/2015    Procedure: ESOPHAGOGASTRODUODENOSCOPY (EGD) WITH PROPOFOL;  Surgeon: Manya Silvas, MD;  Location: [facility] ENDOSCOPY;  Service: Endoscopy;  Laterality: N/A;  . Esophagogastroduodenoscopy (egd) with propofol N/A 07/11/2015    Procedure: ESOPHAGOGASTRODUODENOSCOPY (EGD) WITH PROPOFOL with Barrx;  Surgeon: [name], MD;  Location: [facility] ENDOSCOPY;  Service: Gastroenterology;  Laterality: N/A;  . Appendectomy    . Hemorrhoid surgery  10/02/2013    by simple ligation    Prior to Admission medications   Medication Sig Start Date End Date Taking? Authorizing Provider  acetaminophen (TYLENOL) 650 MG CR tablet Take 650 mg by mouth every 8 (eight) hours as needed for pain.   Yes Historical Provider, MD  diclofenac sodium (VOLTAREN) 1 % GEL Apply 2 g topically 4 (four) times daily as needed.   Yes Historical Provider, MD  esomeprazole (NEXIUM) 20 MG capsule Take 20 mg by mouth daily at 12 noon.   Yes Historical Provider, MD  ipratropium (ATROVENT) 0.03 % nasal spray PLACE 2 SPRAYS INTO BOTH NOSTRILS 2 (TWO) TIMES DAILY. 01/28/15   Crecencio Mc, MD  meloxicam (MOBIC) 7.5 MG tablet Take 1 tablet (7.5 mg total) by mouth daily. Patient not taking: Reported on 09/12/2015 03/14/15 03/13/16  Johnn Hai, PA-C  omeprazole (PRILOSEC) 20 MG capsule Take 20 mg by mouth daily. Reported on 09/12/2015    Historical Provider, MD    Allergies as of 08/20/2015 - Review Complete 07/29/2015  Allergen Reaction Noted  . Sulfamethoxazole-trimethoprim Nausea Only 04/17/2014    Family History  Problem Relation Age of Onset  . Hypertension Mother   . Heart disease Father   . Heart disease Brother  Social History   Social History  . Marital Status: Widowed    Spouse Name: N/A  . Number of Children: N/A  . Years of Education: N/A   Occupational History  . Not on file.   Social History Main Topics  . Smoking status: Former Smoker    Quit date: 06/22/1968  . Smokeless tobacco: Never Used  . Alcohol Use: 1.5 oz/week    3 Standard drinks or equivalent per week     Comment: wine x1 a night  . Drug Use: No  . Sexual Activity: Not on file   Other Topics Concern  . Not on file   Social History Narrative    Review of Systems: See HPI, otherwise negative ROS  Physical Exam: BP 147/91 mmHg  Pulse 70   Temp(Src) 97.2 F (36.2 C) (Tympanic)  Resp 18  Ht 5\' 1"  (1.549 m)  Wt 52.164 kg (115 lb)  BMI 21.74 kg/m2  SpO2 100% General:   Alert,  pleasant and cooperative in NAD Head:  Normocephalic and atraumatic. Neck:  Supple; no masses or thyromegaly. Lungs:  Clear throughout to auscultation.    Heart:  Regular rate and rhythm. Abdomen:  Soft, nontender and nondistended. Normal bowel sounds, without guarding, and without rebound.   Neurologic:  Alert and  oriented x4;  grossly normal neurologically.  Impression/Plan: Karen Parsons is here for an EGD to be performed for Barrett's with indefinite for low grade dysplasia Risks, benefits, limitations, and alternatives regarding  EGD have been reviewed with the patient.  Questions have been answered.  All parties agreeable.   Karen Parsons, Lupita Dawn, MD  09/12/2015, 8:02 AM

## 2015-09-12 NOTE — Anesthesia Preprocedure Evaluation (Signed)
Anesthesia Evaluation  Patient identified by MRN, date of birth, ID band Patient awake    Reviewed: Allergy & Precautions, NPO status , Patient's Chart, lab work & pertinent test results  History of Anesthesia Complications Negative for: history of anesthetic complications  Airway Mallampati: II       Dental   Pulmonary neg pulmonary ROS, former smoker,           Cardiovascular negative cardio ROS       Neuro/Psych negative neurological ROS     GI/Hepatic negative GI ROS, Neg liver ROS, hiatal hernia, GERD  Medicated,  Endo/Other  negative endocrine ROS  Renal/GU negative Renal ROS     Musculoskeletal  (+) Arthritis ,   Abdominal   Peds  Hematology negative hematology ROS (+)   Anesthesia Other Findings   Reproductive/Obstetrics                             Anesthesia Physical Anesthesia Plan  ASA: II  Anesthesia Plan: General   Post-op Pain Management:    Induction: Intravenous  Airway Management Planned: Nasal Cannula  Additional Equipment:   Intra-op Plan:   Post-operative Plan:   Informed Consent: I have reviewed the patients History and Physical, chart, labs and discussed the procedure including the risks, benefits and alternatives for the proposed anesthesia with the patient or authorized representative who has indicated his/her understanding and acceptance.     Plan Discussed with:   Anesthesia Plan Comments:         Anesthesia Quick Evaluation

## 2015-11-14 ENCOUNTER — Ambulatory Visit: Payer: Medicare Other | Admitting: Anesthesiology

## 2015-11-14 ENCOUNTER — Ambulatory Visit
Admission: RE | Admit: 2015-11-14 | Discharge: 2015-11-14 | Disposition: A | Discharge status: Home / Self Care | Payer: Medicare Other | Source: Ambulatory Visit | Attending: Gastroenterology | Admitting: Gastroenterology

## 2015-11-14 ENCOUNTER — Encounter
Admission: RE | Disposition: A | Discharge status: Home / Self Care | Payer: Self-pay | Source: Ambulatory Visit | Attending: Gastroenterology

## 2015-11-14 ENCOUNTER — Encounter: Payer: Self-pay | Admitting: Anesthesiology

## 2015-11-14 DIAGNOSIS — K449 Diaphragmatic hernia without obstruction or gangrene: Secondary | ICD-10-CM | POA: Insufficient documentation

## 2015-11-14 DIAGNOSIS — K228 Other specified diseases of esophagus: Secondary | ICD-10-CM | POA: Insufficient documentation

## 2015-11-14 DIAGNOSIS — Z791 Long term (current) use of non-steroidal anti-inflammatories (NSAID): Secondary | ICD-10-CM | POA: Insufficient documentation

## 2015-11-14 DIAGNOSIS — K219 Gastro-esophageal reflux disease without esophagitis: Secondary | ICD-10-CM | POA: Diagnosis not present

## 2015-11-14 DIAGNOSIS — Z79899 Other long term (current) drug therapy: Secondary | ICD-10-CM | POA: Diagnosis not present

## 2015-11-14 DIAGNOSIS — Z87891 Personal history of nicotine dependence: Secondary | ICD-10-CM | POA: Diagnosis not present

## 2015-11-14 DIAGNOSIS — M159 Polyosteoarthritis, unspecified: Secondary | ICD-10-CM | POA: Insufficient documentation

## 2015-11-14 DIAGNOSIS — K2270 Barrett's esophagus without dysplasia: Secondary | ICD-10-CM | POA: Diagnosis present

## 2015-11-14 HISTORY — PX: ESOPHAGOGASTRODUODENOSCOPY (EGD) WITH PROPOFOL: SHX5813

## 2015-11-14 SURGERY — ESOPHAGOGASTRODUODENOSCOPY (EGD) WITH PROPOFOL
Anesthesia: General

## 2015-11-14 MED ORDER — PROPOFOL 10 MG/ML IV BOLUS
INTRAVENOUS | Status: DC | PRN
  Administered 2015-11-14: 50 mg via INTRAVENOUS

## 2015-11-14 MED ORDER — STERILE WATER FOR INJECTION IJ SOLN
Freq: Once | INTRAMUSCULAR | Status: DC
  Filled 2015-11-14: qty 3

## 2015-11-14 MED ORDER — LIDOCAINE HCL (CARDIAC) 20 MG/ML IV SOLN
INTRAVENOUS | Status: DC | PRN
  Administered 2015-11-14: 60 mg via INTRAVENOUS

## 2015-11-14 MED ORDER — PROPOFOL 500 MG/50ML IV EMUL
INTRAVENOUS | Status: DC | PRN
  Administered 2015-11-14: 100 ug/kg/min via INTRAVENOUS

## 2015-11-14 MED ORDER — SODIUM CHLORIDE 0.9 % IV SOLN
INTRAVENOUS | Status: DC
  Administered 2015-11-14: 08:00:00 via INTRAVENOUS

## 2015-11-14 MED ORDER — MIDAZOLAM HCL 2 MG/2ML IJ SOLN
INTRAMUSCULAR | Status: DC | PRN
  Administered 2015-11-14: 1 mg via INTRAVENOUS

## 2015-11-14 MED ORDER — SODIUM CHLORIDE 0.9 % IV SOLN: INTRAVENOUS | Status: DC

## 2015-11-14 NOTE — Anesthesia Preprocedure Evaluation (Signed)
Anesthesia Evaluation  Patient identified by MRN, date of birth, ID band Patient awake    Reviewed: Allergy & Precautions, NPO status , Patient's Chart, lab work & pertinent test results  History of Anesthesia Complications Negative for: history of anesthetic complications  Airway Mallampati: III  TM Distance: <3 FB Neck ROM: limited    Dental  (+) Poor Dentition   Pulmonary neg shortness of breath, former smoker,           Cardiovascular Exercise Tolerance: Good (-) angina(-) Past MI and (-) DOE negative cardio ROS Normal cardiovascular exam Rhythm:regular Rate:Normal     Neuro/Psych negative neurological ROS     GI/Hepatic Neg liver ROS, hiatal hernia, GERD  Medicated,  Endo/Other  negative endocrine ROS  Renal/GU Renal disease     Musculoskeletal  (+) Arthritis ,   Abdominal   Peds  Hematology negative hematology ROS (+)   Anesthesia Other Findings Past Surgical History:   ABDOMINAL HYSTERECTOMY                                          Comment:age 25, menorrhagia   CARDIAC CATHETERIZATION                         Left July 2010      Comment:[name]:  normal   medial meniscotomy                              Left 2007           Comment:Chris Smith   OSTEOTOMY TOES                                  Right 2008           Comment:[name]   CATARACT EXTRACTION                             Left 2010           Comment:[name]   arthroscopy of knee                             Right              TONSILLECTOMY                                                 COLONOSCOPY WITH PROPOFOL                       N/A 01/07/2015      Comment:Procedure: COLONOSCOPY WITH PROPOFOL;  Surgeon:              Manya Silvas, MD;  Location: [facility]               ENDOSCOPY;  Service: Endoscopy;  Laterality:               N/A;   ESOPHAGOGASTRODUODENOSCOPY  N/A 01/07/2015      Comment:Procedure:  ESOPHAGOGASTRODUODENOSCOPY (EGD);                Surgeon: Manya Silvas, MD;  Location: [facility]               ENDOSCOPY;  Service: Endoscopy;  Laterality:               N/A;   ESOPHAGOGASTRODUODENOSCOPY (EGD) WITH PROPOFOL  N/A 05/23/2015      Comment:Procedure: ESOPHAGOGASTRODUODENOSCOPY (EGD)               WITH PROPOFOL;  Surgeon: Manya Silvas, MD;               Location: [facility] ENDOSCOPY;  Service: Endoscopy;               Laterality: N/A;   ESOPHAGOGASTRODUODENOSCOPY (EGD) WITH PROPOFOL  N/A 07/11/2015      Comment:Procedure: ESOPHAGOGASTRODUODENOSCOPY (EGD)               WITH PROPOFOL with Barrx;  Surgeon: [name],               MD;  Location: [facility] ENDOSCOPY;  Service:               Gastroenterology;  Laterality: N/A;   APPENDECTOMY                                                  HEMORRHOID SURGERY                               10/02/2013      Comment:by simple ligation   ESOPHAGOGASTRODUODENOSCOPY (EGD) WITH PROPOFOL  N/A 09/12/2015      Comment:Procedure: ESOPHAGOGASTRODUODENOSCOPY (EGD)               WITH PROPOFOL;  Surgeon: [name], MD;                Location: [facility] ENDOSCOPY;  Service:               Gastroenterology;  Laterality: N/A;  Past Medical History:   Hyperlipidemia                                                 Comment:intolerant of statins, normal diagnostic cath               July 2010 Nehemiah Massed)   GERD (gastroesophageal reflux disease)                       Osteoporosis                                                   Comment:by DEXA 2008   S/P colonoscopy                                 2011  Comment:[name], polyp removed   Barrett's esophagus                             July 2010      Comment:Wohl,    Hyperlipidemia                                               Cramp of limb                                                Hemorrhoids                                                  Intermediate coronary syndrome ([facility])                          Hallux valgus                                                Colon polyps                                                 History of hiatal hernia                                     Genital prolapse                                             Barrett esophagus                                            Benign neoplasm of colon                                     Bilateral renal cysts                                        Generalized osteoarthritis                                   Left ovarian cyst  Reproductive/Obstetrics                             Anesthesia Physical  Anesthesia Plan  ASA: III  Anesthesia Plan: General   Post-op Pain Management:    Induction: Intravenous  Airway Management Planned: Nasal Cannula  Additional Equipment:   Intra-op Plan:   Post-operative Plan:   Informed Consent: I have reviewed the patients History and Physical, chart, labs and discussed the procedure including the risks, benefits and alternatives for the proposed anesthesia with the patient or authorized representative who has indicated his/her understanding and acceptance.     Plan Discussed with:   Anesthesia Plan Comments:         Anesthesia Quick Evaluation

## 2015-11-14 NOTE — Op Note (Signed)
[facility] Gastroenterology Patient Name: Karen Parsons Procedure Date: 11/14/2015 7:49 AM MRN: [id number] Account #: [id number] Date of Birth: 1941/03/18 Admit Type: Outpatient Age: 75 Room: [facility] ENDO ROOM 4 Gender: Female Note Status: Finalized Procedure:            Upper GI endoscopy Indications:          For therapy of Barrett's esophagus, s/p Barrzx x 2. Hx                        of Barrett's indefinite for low grade dysplasia. Providers:            Lupita Dawn. Candace Cruise, MD Referring MD:         Ramonita Lab, MD (Referring MD) Medicines:            Monitored Anesthesia Care Complications:        No immediate complications. Procedure:            Pre-Anesthesia Assessment:                       - Prior to the procedure, a History and Physical was                        performed, and patient medications, allergies and                        sensitivities were reviewed. The patient's tolerance of                        previous anesthesia was reviewed.                       - The risks and benefits of the procedure and the                        sedation options and risks were discussed with the                        patient. All questions were answered and informed                        consent was obtained.                       - After reviewing the risks and benefits, the patient                        was deemed in satisfactory condition to undergo the                        procedure.                       After obtaining informed consent, the endoscope was                        passed under direct vision. Throughout the procedure,                        the patient's blood pressure, pulse, and oxygen  saturations were monitored continuously. The                        Colonoscope was introduced through the mouth, and                        advanced to the second part of duodenum. The upper GI                        endoscopy was accomplished  without difficulty. The                        patient tolerated the procedure well. Findings:      The esophagus and gastroesophageal junction were examined with Vancleve       light and narrow band imaging (NBI) from a forward view and retroflexed       position. There were esophageal mucosal changes suggestive of       short-segment Barrett's esophagus. These changes involved the mucosa at       the upper extent of the gastric folds (34 cm from the incisors)       extending to the Z-line (33 cm from the incisors). Multiple tongues of       salmon-colored mucosa were present from 33 to 34 cm. The maximum       longitudinal extent of these esophageal mucosal changes was 0.6 cm in       length. Focal radiofrequency ablation of Barrett's esophagus was       performed. With the endoscope in place, the position and extent of the       Barrett's mucosa and the anatomic landmarks including proximal and       distal extent of Barrett's mucosa were noted. Endoscopic visualization       identified an ablation site including the entire visible Barrett's       segment. The Barrett's mucosa was irrigated with N-acetylcysteine       (Mucomyst) 1% mixed with water. Esophageal contents were suctioned. The       endoscope was then removed from the patient. The Barrx-60 radiofrequency       ablation catheter was attached to the tip of the endoscope. The       endoscope with the attached radiofrequency ablation catheter was then       passed transorally under direct vision into the esophagus and advanced       to the areas of Barrett's mucosa. The areas included circumferential       areas of Barrett's mucosa. The radiofrequency ablation catheter was       placed in contact with the surface of the Barrett's mucosa under direct       visualization and energy was applied twice at 12 J/cm2. Ablation was       repeated in a likewise fashion to the entire area of suspected Barrett's       mucosa. The ablation zone  was cleaned of coagulative debris. The       ablation catheter and endoscope were then removed and the catheter was       cleaned. The catheter and endoscope were reinserted into the esophagus.       A second round of ablation was then performed. Energy was applied twice       at 12 J/cm2 to retreat the areas of Barrett's epithelium that had been  treated with the first series of ablation. The areas of the esophagus       where Barrett's mucosa had been ablated were examined. Areas of visible       Barrett's esophagus were completely ablated. Total of 22 treatments were       given      The exam was otherwise without abnormality.      A medium-sized hiatal hernia was present.      The exam was otherwise without abnormality.      The examined duodenum was normal. Impression:           - Esophageal mucosal changes suggestive of                        short-segment Barrett's esophagus. Treated with                        radiofrequency ablation.                       - The examination was otherwise normal.                       - Medium-sized hiatal hernia.                       - The examination was otherwise normal.                       - Normal examined duodenum.                       - No specimens collected. Recommendation:       - Discharge patient to home.                       - Observe patient's clinical course.                       - The findings and recommendations were discussed with                        the patient.                       - GI cocktail prn and tylenol with codeine elixir prn                        for pain                       - Would repeat EGD in 3-6 months with NBI for biopsies                        to see if Barrett's eradicated. Procedure Code(s):    --- Professional ---                       [phone], Esophagogastroduodenoscopy, flexible, transoral;                        with ablation of tumor(s), polyp(s), or other lesion(s)                         (  includes pre- and post-dilation and guide wire                        passage, when performed) Diagnosis Code(s):    --- Professional ---                       K22.70, Barrett's esophagus without dysplasia                       K44.9, Diaphragmatic hernia without obstruction or                        gangrene CPT copyright 2016 [employer]. All rights reserved. The codes documented in this report are preliminary and upon coder review may  be revised to meet current compliance requirements. [name], MD 11/14/2015 8:15:29 AM This report has been signed electronically. Number of Addenda: 0 Note Initiated On: 11/14/2015 7:49 AM      [facility]

## 2015-11-14 NOTE — Transfer of Care (Signed)
Immediate Anesthesia Transfer of Care Note  Patient: Karen Parsons  Procedure(s) Performed: Procedure(s): ESOPHAGOGASTRODUODENOSCOPY (EGD) WITH PROPOFOL (N/A)  Patient Location: PACU  Anesthesia Type:General  Level of Consciousness: awake, alert , oriented and patient cooperative  Airway & Oxygen Therapy: Patient Spontanous Breathing and Patient connected to nasal cannula oxygen  Post-op Assessment: Report given to RN, Post -op Vital signs reviewed and stable and Patient moving all extremities X 4  Post vital signs: Reviewed and stable  Last Vitals:  Filed Vitals:   11/14/15 0710  BP: 150/90  Pulse: 75  Temp: 35.9 C  Resp: 14    Last Pain: There were no vitals filed for this visit.       Complications: No apparent anesthesia complications

## 2015-11-14 NOTE — Anesthesia Postprocedure Evaluation (Signed)
Anesthesia Post Note  Patient: Karen Parsons  Procedure(s) Performed: Procedure(s) (LRB): ESOPHAGOGASTRODUODENOSCOPY (EGD) WITH PROPOFOL (N/A)  Patient location during evaluation: Endoscopy Anesthesia Type: General Level of consciousness: awake and alert Pain management: pain level controlled Vital Signs Assessment: post-procedure vital signs reviewed and stable Respiratory status: spontaneous breathing, nonlabored ventilation, respiratory function stable and patient connected to nasal cannula oxygen Cardiovascular status: blood pressure returned to baseline and stable Postop Assessment: no signs of nausea or vomiting Anesthetic complications: no    Last Vitals:  Filed Vitals:   11/14/15 0710 11/14/15 0815  BP: 150/90 102/59  Pulse: 75 61  Temp: 35.9 C 35.6 C  Resp: 14 17    Last Pain: There were no vitals filed for this visit.               Precious Haws Piscitello

## 2015-11-14 NOTE — H&P (Signed)
Primary Care Physician:  Adin Hector, MD Primary Gastroenterologist:  Dr. Candace Cruise  Pre-Procedure History & Physical: HPI:  Karen Parsons is a 75 y.o. female is here for an EGD  Past Medical History  Diagnosis Date  . Hyperlipidemia     intolerant of statins, normal diagnostic cath July 2010 Nehemiah Massed)  . GERD (gastroesophageal reflux disease)   . Osteoporosis     by DEXA 2008  . S/P colonoscopy 2011    [name], polyp removed  . Barrett's esophagus July 2010    Wohl,   . Hyperlipidemia   . Cramp of limb   . Hemorrhoids   . 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 Hallux valgus   . Colon polyps   . History of hiatal hernia   . Genital prolapse   . Barrett esophagus   . Benign neoplasm of colon   . Bilateral renal cysts   . Generalized osteoarthritis   . Left ovarian cyst     Past Surgical History  Procedure Laterality Date  . Abdominal hysterectomy      age 22, menorrhagia  . Cardiac catheterization Left July 2010    [name]:  normal  . Medial meniscotomy Left 2007    [name]  . Osteotomy toes Right 2008    [name]  . Cataract extraction Left 2010    [name]  . Arthroscopy of knee Right   . Tonsillectomy    . Colonoscopy with propofol N/A 01/07/2015    Procedure: COLONOSCOPY WITH PROPOFOL;  Surgeon: Manya Silvas, MD;  Location: [facility] ENDOSCOPY;  Service: Endoscopy;  Laterality: N/A;  . Esophagogastroduodenoscopy N/A 01/07/2015    Procedure: ESOPHAGOGASTRODUODENOSCOPY (EGD);  Surgeon: Manya Silvas, MD;  Location: [facility] ENDOSCOPY;  Service: Endoscopy;  Laterality: N/A;  . Esophagogastroduodenoscopy (egd) with propofol N/A 05/23/2015    Procedure: ESOPHAGOGASTRODUODENOSCOPY (EGD) WITH PROPOFOL;  Surgeon: Manya Silvas, MD;  Location: [facility] ENDOSCOPY;  Service: Endoscopy;  Laterality: N/A;  . Esophagogastroduodenoscopy (egd) with propofol N/A 07/11/2015    Procedure: ESOPHAGOGASTRODUODENOSCOPY (EGD) WITH PROPOFOL with Barrx;  Surgeon: [name], MD;   Location: [facility] ENDOSCOPY;  Service: Gastroenterology;  Laterality: N/A;  . Appendectomy    . Hemorrhoid surgery  10/02/2013    by simple ligation  . Esophagogastroduodenoscopy (egd) with propofol N/A 09/12/2015    Procedure: ESOPHAGOGASTRODUODENOSCOPY (EGD) WITH PROPOFOL;  Surgeon: [name], MD;  Location: [facility] ENDOSCOPY;  Service: Gastroenterology;  Laterality: N/A;    Prior to Admission medications   Medication Sig Start Date End Date Taking? Authorizing Provider  esomeprazole (NEXIUM) 20 MG capsule Take 20 mg by mouth daily at 12 noon.   Yes Historical Provider, MD  acetaminophen (TYLENOL) 650 MG CR tablet Take 650 mg by mouth every 8 (eight) hours as needed for pain.    Historical Provider, MD  diclofenac sodium (VOLTAREN) 1 % GEL Apply 2 g topically 4 (four) times daily as needed.    Historical Provider, MD  ipratropium (ATROVENT) 0.03 % nasal spray PLACE 2 SPRAYS INTO BOTH NOSTRILS 2 (TWO) TIMES DAILY. 01/28/15   Crecencio Mc, MD  meloxicam (MOBIC) 7.5 MG tablet Take 1 tablet (7.5 mg total) by mouth daily. Patient not taking: Reported on 09/12/2015 03/14/15 03/13/16  Johnn Hai, PA-C  omeprazole (PRILOSEC) 20 MG capsule Take 20 mg by mouth daily. Reported on 09/12/2015    Historical Provider, MD    Allergies as of 10/25/2015 - Review Complete 09/12/2015  Allergen Reaction Noted  . Lipitor [atorvastatin]  Other (See Comments) 09/11/2015  . Sulfamethoxazole-trimethoprim Nausea Only 04/17/2014  . Welchol [colesevelam] Other (See Comments) 09/11/2015    Family History  Problem Relation Age of Onset  . Hypertension Mother   . Heart disease Father   . Heart disease Brother     Social History   Social History  . Marital Status: Widowed    Spouse Name: N/A  . Number of Children: N/A  . Years of Education: N/A   Occupational History  . Not on file.   Social History Main Topics  . Smoking status: Former Smoker    Quit date: 06/22/1968  . Smokeless tobacco: Never Used  .  Alcohol Use: 1.5 oz/week    3 Standard drinks or equivalent per week     Comment: wine x1 a night  . Drug Use: No  . Sexual Activity: Not on file   Other Topics Concern  . Not on file   Social History Narrative    Review of Systems: See HPI, otherwise negative ROS  Physical Exam: BP 150/90 mmHg  Pulse 75  Temp(Src) 96.7 F (35.9 C) (Tympanic)  Resp 14  Ht 5\' 3"  (1.6 m)  Wt 115 lb (52.164 kg)  BMI 20.38 kg/m2  SpO2 99% General:   Alert,  pleasant and cooperative in NAD Head:  Normocephalic and atraumatic. Neck:  Supple; no masses or thyromegaly. Lungs:  Clear throughout to auscultation.    Heart:  Regular rate and rhythm. Abdomen:  Soft, nontender and nondistended. Normal bowel sounds, without guarding, and without rebound.   Neurologic:  Alert and  oriented x4;  grossly normal neurologically.  Impression/Plan: Karen Parsons is here for an EGD to be performed for Barrett's with low grade dysplasia Risks, benefits, limitations, and alternatives regarding  EGD have been reviewed with the patient.  Questions have been answered.  All parties agreeable.   Karen Parsons, Karen Dawn, MD  11/14/2015, 7:41 AM

## 2015-11-15 ENCOUNTER — Encounter: Payer: Self-pay | Admitting: Gastroenterology

## 2015-11-21 DIAGNOSIS — I639 Cerebral infarction, unspecified: Secondary | ICD-10-CM

## 2015-11-21 HISTORY — DX: Cerebral infarction, unspecified: I63.9

## 2015-12-18 ENCOUNTER — Emergency Department: Payer: Medicare Other

## 2015-12-18 ENCOUNTER — Encounter: Payer: Self-pay | Admitting: Emergency Medicine

## 2015-12-18 ENCOUNTER — Observation Stay
Admission: EM | Admit: 2015-12-18 | Discharge: 2015-12-20 | Disposition: A | Discharge status: Home / Self Care | Payer: Medicare Other | Attending: Internal Medicine | Admitting: Internal Medicine

## 2015-12-18 ENCOUNTER — Inpatient Hospital Stay
Admit: 2015-12-18 | Discharge: 2015-12-18 | Disposition: A | Discharge status: Home / Self Care | Payer: Medicare Other | Attending: Internal Medicine | Admitting: Internal Medicine

## 2015-12-18 DIAGNOSIS — K449 Diaphragmatic hernia without obstruction or gangrene: Secondary | ICD-10-CM | POA: Insufficient documentation

## 2015-12-18 DIAGNOSIS — Z8249 Family history of ischemic heart disease and other diseases of the circulatory system: Secondary | ICD-10-CM | POA: Insufficient documentation

## 2015-12-18 DIAGNOSIS — R739 Hyperglycemia, unspecified: Secondary | ICD-10-CM

## 2015-12-18 DIAGNOSIS — Z87891 Personal history of nicotine dependence: Secondary | ICD-10-CM | POA: Insufficient documentation

## 2015-12-18 DIAGNOSIS — K649 Unspecified hemorrhoids: Secondary | ICD-10-CM | POA: Diagnosis not present

## 2015-12-18 DIAGNOSIS — K5792 Diverticulitis of intestine, part unspecified, without perforation or abscess without bleeding: Secondary | ICD-10-CM | POA: Insufficient documentation

## 2015-12-18 DIAGNOSIS — Z888 Allergy status to other drugs, medicaments and biological substances status: Secondary | ICD-10-CM | POA: Diagnosis not present

## 2015-12-18 DIAGNOSIS — I6523 Occlusion and stenosis of bilateral carotid arteries: Secondary | ICD-10-CM | POA: Diagnosis not present

## 2015-12-18 DIAGNOSIS — N83202 Unspecified ovarian cyst, left side: Secondary | ICD-10-CM | POA: Diagnosis not present

## 2015-12-18 DIAGNOSIS — M159 Polyosteoarthritis, unspecified: Secondary | ICD-10-CM | POA: Diagnosis not present

## 2015-12-18 DIAGNOSIS — R531 Weakness: Secondary | ICD-10-CM

## 2015-12-18 DIAGNOSIS — Z7982 Long term (current) use of aspirin: Secondary | ICD-10-CM | POA: Insufficient documentation

## 2015-12-18 DIAGNOSIS — M810 Age-related osteoporosis without current pathological fracture: Secondary | ICD-10-CM | POA: Insufficient documentation

## 2015-12-18 DIAGNOSIS — R251 Tremor, unspecified: Secondary | ICD-10-CM | POA: Diagnosis not present

## 2015-12-18 DIAGNOSIS — Z9842 Cataract extraction status, left eye: Secondary | ICD-10-CM | POA: Diagnosis not present

## 2015-12-18 DIAGNOSIS — R32 Unspecified urinary incontinence: Secondary | ICD-10-CM | POA: Insufficient documentation

## 2015-12-18 DIAGNOSIS — Z882 Allergy status to sulfonamides status: Secondary | ICD-10-CM | POA: Diagnosis not present

## 2015-12-18 DIAGNOSIS — Z90710 Acquired absence of both cervix and uterus: Secondary | ICD-10-CM | POA: Diagnosis not present

## 2015-12-18 DIAGNOSIS — R4781 Slurred speech: Secondary | ICD-10-CM | POA: Diagnosis not present

## 2015-12-18 DIAGNOSIS — Z79899 Other long term (current) drug therapy: Secondary | ICD-10-CM | POA: Insufficient documentation

## 2015-12-18 DIAGNOSIS — E785 Hyperlipidemia, unspecified: Secondary | ICD-10-CM | POA: Diagnosis not present

## 2015-12-18 DIAGNOSIS — Z8719 Personal history of other diseases of the digestive system: Secondary | ICD-10-CM | POA: Insufficient documentation

## 2015-12-18 DIAGNOSIS — G8191 Hemiplegia, unspecified affecting right dominant side: Secondary | ICD-10-CM | POA: Insufficient documentation

## 2015-12-18 DIAGNOSIS — Z86010 Personal history of colonic polyps: Secondary | ICD-10-CM | POA: Insufficient documentation

## 2015-12-18 DIAGNOSIS — M6289 Other specified disorders of muscle: Secondary | ICD-10-CM | POA: Diagnosis present

## 2015-12-18 DIAGNOSIS — K219 Gastro-esophageal reflux disease without esophagitis: Secondary | ICD-10-CM | POA: Diagnosis not present

## 2015-12-18 DIAGNOSIS — I639 Cerebral infarction, unspecified: Principal | ICD-10-CM

## 2015-12-18 DIAGNOSIS — R636 Underweight: Secondary | ICD-10-CM | POA: Diagnosis not present

## 2015-12-18 DIAGNOSIS — G459 Transient cerebral ischemic attack, unspecified: Secondary | ICD-10-CM | POA: Diagnosis present

## 2015-12-18 DIAGNOSIS — N289 Disorder of kidney and ureter, unspecified: Secondary | ICD-10-CM

## 2015-12-18 LAB — CBC
HCT: 40.5 % (ref 35.0–47.0)
HCT: 38.1 % (ref 35.0–47.0)
Hemoglobin: 13.6 g/dL (ref 12.0–16.0)
Hemoglobin: 12.8 g/dL (ref 12.0–16.0)
MCH: 31.3 pg (ref 26.0–34.0)
MCH: 31.7 pg (ref 26.0–34.0)
MCHC: 33.5 g/dL (ref 32.0–36.0)
MCHC: 33.5 g/dL (ref 32.0–36.0)
MCV: 93.5 fL (ref 80.0–100.0)
MCV: 94.6 fL (ref 80.0–100.0)
PLATELETS: 231 10*3/uL (ref 150–440)
Platelets: 205 10*3/uL (ref 150–440)
RBC: 4.34 MIL/uL (ref 3.80–5.20)
RBC: 4.03 MIL/uL (ref 3.80–5.20)
RDW: 13.5 % (ref 11.5–14.5)
RDW: 13.5 % (ref 11.5–14.5)
WBC: 7.7 10*3/uL (ref 3.6–11.0)
WBC: 6.9 10*3/uL (ref 3.6–11.0)

## 2015-12-18 LAB — TSH: TSH: 1.416 u[IU]/mL (ref 0.350–4.500)

## 2015-12-18 LAB — COMPREHENSIVE METABOLIC PANEL
ALT: 17 U/L (ref 14–54)
AST: 26 U/L (ref 15–41)
Albumin: 4.8 g/dL (ref 3.5–5.0)
Alkaline Phosphatase: 61 U/L (ref 38–126)
Anion gap: 10 (ref 5–15)
BUN: 25 mg/dL — AB (ref 6–20)
CHLORIDE: 103 mmol/L (ref 101–111)
CO2: 26 mmol/L (ref 22–32)
CREATININE: 1.07 mg/dL — AB (ref 0.44–1.00)
Calcium: 9.6 mg/dL (ref 8.9–10.3)
GFR calc Af Amer: 57 mL/min — ABNORMAL LOW (ref >60)
GFR calc non Af Amer: 49 mL/min — ABNORMAL LOW (ref >60)
Glucose, Bld: 108 mg/dL — ABNORMAL HIGH (ref 65–99)
Potassium: 3.9 mmol/L (ref 3.5–5.1)
SODIUM: 139 mmol/L (ref 135–145)
Total Bilirubin: 0.9 mg/dL (ref 0.3–1.2)
Total Protein: 7.4 g/dL (ref 6.5–8.1)

## 2015-12-18 LAB — DIFFERENTIAL
BASOS ABS: 0 10*3/uL (ref 0–0.1)
BASOS PCT: 1 %
Eosinophils Absolute: 0.1 10*3/uL (ref 0–0.7)
Eosinophils Relative: 1 %
Lymphocytes Relative: 16 %
Lymphs Abs: 1.3 10*3/uL (ref 1.0–3.6)
Monocytes Absolute: 0.7 10*3/uL (ref 0.2–0.9)
Monocytes Relative: 9 %
NEUTROS ABS: 5.6 10*3/uL (ref 1.4–6.5)
Neutrophils Relative %: 73 %

## 2015-12-18 LAB — PROTIME-INR
INR: 1.03
PROTHROMBIN TIME: 13.7 s (ref 11.4–15.0)

## 2015-12-18 LAB — APTT: APTT: 25 s (ref 24–36)

## 2015-12-18 LAB — CREATININE, SERUM
CREATININE: 0.85 mg/dL (ref 0.44–1.00)
GFR calc Af Amer: >60 mL/min (ref >60)
GFR calc non Af Amer: >60 mL/min (ref >60)

## 2015-12-18 LAB — ETHANOL

## 2015-12-18 MED ORDER — ASPIRIN 325 MG PO TABS
325.0000 mg | ORAL_TABLET | Freq: Every day | ORAL | Status: DC
  Administered 2015-12-19 – 2015-12-20 (×2): 325 mg via ORAL
  Filled 2015-12-18 (×2): qty 1

## 2015-12-18 MED ORDER — ASPIRIN 81 MG PO CHEW
324.0000 mg | CHEWABLE_TABLET | Freq: Once | ORAL | Status: DC
  Filled 2015-12-18: qty 4

## 2015-12-18 MED ORDER — ONDANSETRON HCL 4 MG PO TABS: 4.0000 mg | ORAL_TABLET | Freq: Four times a day (QID) | ORAL | Status: DC | PRN

## 2015-12-18 MED ORDER — ENOXAPARIN SODIUM 40 MG/0.4ML ~~LOC~~ SOLN
40.0000 mg | SUBCUTANEOUS | Status: DC
  Administered 2015-12-18 – 2015-12-19 (×2): 40 mg via SUBCUTANEOUS
  Filled 2015-12-18 (×2): qty 0.4

## 2015-12-18 MED ORDER — ASPIRIN EC 325 MG PO TBEC
325.0000 mg | DELAYED_RELEASE_TABLET | Freq: Once | ORAL | Status: AC
  Administered 2015-12-18: 325 mg via ORAL
  Filled 2015-12-18: qty 1

## 2015-12-18 MED ORDER — ONDANSETRON HCL 4 MG/2ML IJ SOLN: 4.0000 mg | Freq: Four times a day (QID) | INTRAMUSCULAR | Status: DC | PRN

## 2015-12-18 MED ORDER — PANTOPRAZOLE SODIUM 40 MG PO TBEC
40.0000 mg | DELAYED_RELEASE_TABLET | Freq: Every day | ORAL | Status: DC
  Administered 2015-12-19 – 2015-12-20 (×2): 40 mg via ORAL
  Filled 2015-12-18 (×2): qty 1

## 2015-12-18 MED ORDER — POTASSIUM CHLORIDE IN NACL 20-0.9 MEQ/L-% IV SOLN
INTRAVENOUS | Status: DC
  Administered 2015-12-18 – 2015-12-19 (×3): via INTRAVENOUS
  Filled 2015-12-18 (×5): qty 1000

## 2015-12-18 MED ORDER — ACETAMINOPHEN 325 MG PO TABS
650.0000 mg | ORAL_TABLET | Freq: Three times a day (TID) | ORAL | Status: DC | PRN
Start: 2015-12-18 — End: 2015-12-20
  Filled 2015-12-18: qty 2

## 2015-12-18 MED ORDER — SODIUM CHLORIDE 0.9% FLUSH
3.0000 mL | Freq: Two times a day (BID) | INTRAVENOUS | Status: DC
  Administered 2015-12-18 – 2015-12-20 (×4): 3 mL via INTRAVENOUS

## 2015-12-18 MED ORDER — IPRATROPIUM BROMIDE 0.03 % NA SOLN
1.0000 | Freq: Two times a day (BID) | NASAL | Status: DC
  Administered 2015-12-19: 1 via NASAL
  Filled 2015-12-18: qty 30

## 2015-12-18 MED ORDER — DICLOFENAC SODIUM 1 % TD GEL
2.0000 g | Freq: Four times a day (QID) | TRANSDERMAL | Status: DC | PRN
  Filled 2015-12-18: qty 100

## 2015-12-18 MED ORDER — MELOXICAM 7.5 MG PO TABS
7.5000 mg | ORAL_TABLET | Freq: Every day | ORAL | Status: DC
  Administered 2015-12-18 – 2015-12-19 (×2): 7.5 mg via ORAL
  Filled 2015-12-18 (×2): qty 1

## 2015-12-18 NOTE — ED Notes (Addendum)
Pt presents with dizziness and right hand weakness. Neighbor states right side face drooping prior to arrival. Pt states she could hardly brush her teeth today before they got here. Pt also states has some right eye drooping last week.

## 2015-12-18 NOTE — ED Provider Notes (Signed)
[facility] Emergency Department Provider Note  Time seen: 4:34 PM  I have reviewed the triage vital signs and the nursing notes.   HISTORY  Chief Complaint Code Stroke    HPI Karen Parsons is a 75 y.o. female with a past medical history of gastric reflux who presents to the emergency department with dizziness and possible facial droop. According to the neighbor at 1:30 PM today he states he was outside with the patient when she suddenly stated that she felt extremely dizzy. The neighbor states her right arm shaking, she cannot get her right leg to move he brought a chair over to her but she started falling so he helped her down into the chair. States he gave her a bottle water but it fell out of her right hand, the patient did not appear deep and noticed that it had fallen out of her hand per the neighbor. States it looked like the right side of her face was drooping and her speech was extremely slurred. He called EMS however upon EMS arrival patient states she was feeling much better and per the neighbor hadn't NIH stroke scale score of 0. They decided to go by private vehicle to her primary care doctor Dr. Jens Som who promptly referred her to the emergency department. Upon arrival she was deemed a code stroke and brought immediately to the CT scanner. Upon my examination the patient denies any headache, confusion. Has no slurred speech or difficulty speaking. Patient does have significant tremor especially in the right upper extremity and mild left upper extremity. Patient has decreased grip strength in the right upper extremity. No appreciable facial droop. Patient denies any history of CVA in the past. No blood thinner use.     Past Medical History  Diagnosis Date  . Hyperlipidemia     intolerant of statins, normal diagnostic cath July 2010 Nehemiah Massed)  . GERD (gastroesophageal reflux disease)   . Osteoporosis     by DEXA 2008  . S/P colonoscopy 2011    [name],  polyp removed  . Barrett's esophagus July 2010    Wohl,   . Hyperlipidemia   . Cramp of limb   . Hemorrhoids   . Intermediate coronary syndrome ([place])   . Hallux valgus   . Colon polyps   . History of hiatal hernia   . Genital prolapse   . Barrett esophagus   . Benign neoplasm of colon   . Bilateral renal cysts   . Generalized osteoarthritis   . Left ovarian cyst     Patient Active Problem List   Diagnosis Date Noted  . Thumb pain 07/29/2015  . Acute diverticulitis 07/01/2015  . Adnexal mass 07/01/2015  . Underweight 11/10/2014  . Muscle cramps at night 11/10/2014  . Complaints of leg weakness 11/10/2014  . Medicare annual wellness visit, subsequent 04/18/2014  . Prolapse of female pelvic organs 07/11/2013  . Screening for breast cancer 10/29/2012  . Tubular adenoma of colon 10/29/2012  . Hyperlipidemia   . GERD (gastroesophageal reflux disease)   . S/P colonoscopy   . Barrett's esophagus     Past Surgical History  Procedure Laterality Date  . Abdominal hysterectomy      age 10, menorrhagia  . Cardiac catheterization Left July 2010    [name]:  normal  . Medial meniscotomy Left 2007    [name]  . Osteotomy toes Right 2008    [name]  . Cataract extraction Left 2010    [name]  . Arthroscopy of  knee Right   . Tonsillectomy    . Colonoscopy with propofol N/A 01/07/2015    Procedure: COLONOSCOPY WITH PROPOFOL;  Surgeon: Manya Silvas, MD;  Location: [facility] ENDOSCOPY;  Service: Endoscopy;  Laterality: N/A;  . Esophagogastroduodenoscopy N/A 01/07/2015    Procedure: ESOPHAGOGASTRODUODENOSCOPY (EGD);  Surgeon: Manya Silvas, MD;  Location: [facility] ENDOSCOPY;  Service: Endoscopy;  Laterality: N/A;  . Esophagogastroduodenoscopy (egd) with propofol N/A 05/23/2015    Procedure: ESOPHAGOGASTRODUODENOSCOPY (EGD) WITH PROPOFOL;  Surgeon: Manya Silvas, MD;  Location: [facility] ENDOSCOPY;  Service: Endoscopy;  Laterality: N/A;  . Esophagogastroduodenoscopy (egd) with  propofol N/A 07/11/2015    Procedure: ESOPHAGOGASTRODUODENOSCOPY (EGD) WITH PROPOFOL with Barrx;  Surgeon: [name], MD;  Location: [facility] ENDOSCOPY;  Service: Gastroenterology;  Laterality: N/A;  . Appendectomy    . Hemorrhoid surgery  10/02/2013    by simple ligation  . Esophagogastroduodenoscopy (egd) with propofol N/A 09/12/2015    Procedure: ESOPHAGOGASTRODUODENOSCOPY (EGD) WITH PROPOFOL;  Surgeon: [name], MD;  Location: [facility] ENDOSCOPY;  Service: Gastroenterology;  Laterality: N/A;  . Esophagogastroduodenoscopy (egd) with propofol N/A 11/14/2015    Procedure: ESOPHAGOGASTRODUODENOSCOPY (EGD) WITH PROPOFOL;  Surgeon: [name], MD;  Location: [facility] ENDOSCOPY;  Service: Gastroenterology;  Laterality: N/A;    Current Outpatient Rx  Name  Route  Sig  Dispense  Refill  . acetaminophen (TYLENOL) 650 MG CR tablet   Oral   Take 650 mg by mouth every 8 (eight) hours as needed for pain.         Marland Kitchen diclofenac sodium (VOLTAREN) 1 % GEL   Topical   Apply 2 g topically 4 (four) times daily as needed.         Marland Kitchen esomeprazole (NEXIUM) 20 MG capsule   Oral   Take 20 mg by mouth daily at 12 noon.         Marland Kitchen ipratropium (ATROVENT) 0.03 % nasal spray      PLACE 2 SPRAYS INTO BOTH NOSTRILS 2 (TWO) TIMES DAILY.   30 mL   2   . meloxicam (MOBIC) 7.5 MG tablet   Oral   Take 1 tablet (7.5 mg total) by mouth daily. Patient not taking: Reported on 09/12/2015   14 tablet   2   . omeprazole (PRILOSEC) 20 MG capsule   Oral   Take 20 mg by mouth daily. Reported on 09/12/2015           Allergies Lipitor; Sulfamethoxazole-trimethoprim; and Welchol  Family History  Problem Relation Age of Onset  . Hypertension Mother   . Heart disease Father   . Heart disease Brother     Social History Social History  Substance Use Topics  . Smoking status: Former Smoker    Quit date: 06/22/1968  . Smokeless tobacco: Never Used  . Alcohol Use: 1.5 oz/week    3 Standard drinks or equivalent per week      Comment: wine x1 a night    Review of Systems Constitutional: Negative for fever. Cardiovascular: Negative for chest pain. Respiratory: Negative for shortness of breath. Gastrointestinal: Negative for abdominal pain Musculoskeletal: Negative for back pain. Neurological: Negative for Headache. Positive for tremor especially in the right upper extremity. Mild dizziness. 10-point ROS otherwise negative.  ____________________________________________   PHYSICAL EXAM:  VITAL SIGNS: ED Triage Vitals  Enc Vitals Group     BP 12/18/15 1605 142/88 mmHg     Pulse Rate 12/18/15 1605 85     Resp 12/18/15 1605 20     Temp  12/18/15 1605 97.7 F (36.5 C)     Temp src --      SpO2 12/18/15 1605 97 %     Weight 12/18/15 1605 150 lb (68.04 kg)     Height 12/18/15 1605 5\' 3"  (1.6 m)     Head Cir --      Peak Flow --      Pain Score --      Pain Loc --      Pain Edu? --      Excl. in [place]? --     Constitutional: Alert and oriented. Well appearing and in no distress. Eyes: Normal exam ENT   Head: Normocephalic and atraumatic.   Mouth/Throat: Mucous membranes are moist. Cardiovascular: Normal rate, regular rhythm. No murmur Respiratory: Normal respiratory effort without tachypnea nor retractions. Breath sounds are clear  Gastrointestinal: Soft and nontender. No distention.  Musculoskeletal: Nontender with normal range of motion in all extremities. Neurologic:  Normal speech and language. 4/5 motor in right upper extremity. Sensation equal. Significant tremor and right upper extremity, very ataxic on finger-to-nose testing  Especially Of the right upper extremity, mild ataxia the left upper extremity. 5/5 motor in lower extremities.Skin:  Skin is warm, dry and intact.  Psychiatric: Mood and affect are normal. Speech and behavior are normal. Patient exhibits appropriate insight and judgment.  ____________________________________________    EKG  EKG reviewed and interpreted by  myself shows normal sinus rhythm at 70 bpm, narrow QS, normal axis, normal intervals, no ST changes. Overall normal EKG.  ____________________________________________    RADIOLOGY  CT head shows no acute abnormality  ____________________________________________   INITIAL IMPRESSION / ASSESSMENT AND PLAN / ED COURSE  Pertinent labs & imaging results that were available during my care of the patient were reviewed by me and considered in my medical decision making (see chart for details).  Patient presents as a code stroke. Patient does have significant tremor especially the right upper extremity decreased grip strength on right upper extremity consistent with likely CVA, possible cerebellar CVA given the significant tremor and ataxia. Patient states approximately one week ago she noticed a right-sided facial droop and which was not able to fully close her right eye. She states it lasted less than an hour and then resolved. We will consult neurology emergently to evaluate. However as the patient had symptoms last week that resolved and now has recurrent symptoms I do not believe she would be a TPA candidate.  CT head shows no acute abnormality.   NIH Stroke Scale   Interval: Upon arrival Time: 4:43 PM Person Administering Scale: Maggy Wyble  Administer stroke scale items in the order listed. Record performance in each category after each subscale exam. Do not go back and change scores. Follow directions provided for each exam technique. Scores should reflect what the patient does, not what the clinician thinks the patient can do. The clinician should record answers while administering the exam and work quickly. Except where indicated, the patient should not be coached (i.e., repeated requests to patient to make a special effort).   1a  Level of consciousness: 0=alert; keenly responsive  1b. LOC questions:  0=Performs both tasks correctly  1c. LOC commands: 0=Performs both tasks  correctly  2.  Best Gaze: 0=normal  3.  Visual: 0=No visual loss  4. Facial Palsy: 0=Normal symmetric movement  5a.  Motor left arm: 0=No drift, limb holds 90 (or 45) degrees for full 10 seconds  5b.  Motor right arm: 0=No drift, limb  holds 90 (or 45) degrees for full 10 seconds  6a. motor left leg: 0=No drift, limb holds 90 (or 45) degrees for full 10 seconds  6b  Motor right leg:  0=No drift, limb holds 90 (or 45) degrees for full 10 seconds  7. Limb Ataxia: 2=Present in two limbs  8.  Sensory: 0=Normal; no sensory loss  9. Best Language:  0=No aphasia, normal  10. Dysarthria: 0=Normal  11. Extinction and Inattention: 0=No abnormality  12. Distal motor function: 1=At least some extension after 5 seconds, but is not fully extended. Any movement of the fingers which is not in response to a command is not scored   Total:   3   Labs are largely within normal limits. CT negative. We'll dose aspirin and admitted to the hospital. Telemetry neurology has seen the patient, they do not believe the patient is a candidate for TPA as the symptoms have largely resolved.  ____________________________________________   FINAL CLINICAL IMPRESSION(S) / ED DIAGNOSES  CVA   Harvest Dark, MD 12/18/15 [phone]

## 2015-12-18 NOTE — ED Notes (Addendum)
Episode happened around 130 pm today and ems was called. Neighbor states when he called 911 they guided him on how to test for a stroke and  she was negative per the NIH stroke scale.

## 2015-12-18 NOTE — H&P (Addendum)
[place] [facility] [place] NAME: Karen Parsons    MR#:  [id number]  DATE OF BIRTH:  [birth year]  DATE OF ADMISSION:  12/18/2015  PRIMARY CARE PHYSICIAN: Tama High III, MD   REQUESTING/REFERRING PHYSICIAN:   CHIEF COMPLAINT:   Chief Complaint  Patient presents with  . Code Stroke    HISTORY OF PRESENT ILLNESS: Karen Parsons  is a 75 y.o. female with a known history of Hyperlipidemia, not on statins due to intolerance, gastroesophageal reflux disease, osteoporosis, Barrett's esophagus, colon polyps, who presents to the hospital with complaints of dizziness, shaky right arm, feeling presyncopal, slurry speech. Apparently patient was doing well up until today at around 1:59 PM she started having pain in the left buttock area, felt dizzy, shaky, she felt that she is she was going to pass out, her neighbor  helped her to the chair, gave her a bottle of water, which slid out from her hand, she is not sure which hand it was, she thinks it was left.  She also had an episode of urinary incontinence and slurring of speech. Episode lasted about 15 minutes.. She reports having droopy right eye about a week ago, resolved in 3 hours. Patient denied any weakness since the episode a week ago.  In the emergency room she was noted to have right-sided weakness including right facial weakness. Hospitalist services were contacted for admission. CT of head without contrast was unremarkable.  PAST MEDICAL HISTORY:   Past Medical History  Diagnosis Date  . Hyperlipidemia     intolerant of statins, normal diagnostic cath July 2010 Nehemiah Massed)  . GERD (gastroesophageal reflux disease)   . Osteoporosis     by DEXA 2008  . S/P colonoscopy 2011    [name], polyp removed  . Barrett's esophagus July 2010    Wohl,   . Hyperlipidemia   . Cramp of limb   . Hemorrhoids   . 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 Hallux valgus   . Colon polyps   . History of hiatal  hernia   . Genital prolapse   . Barrett esophagus   . Benign neoplasm of colon   . Bilateral renal cysts   . Generalized osteoarthritis   . Left ovarian cyst     PAST SURGICAL HISTORY: Past Surgical History  Procedure Laterality Date  . Abdominal hysterectomy      age 74, menorrhagia  . Cardiac catheterization Left July 2010    [name]:  normal  . Medial meniscotomy Left 2007    [name]  . Osteotomy toes Right 2008    [name]  . Cataract extraction Left 2010    [name]  . Arthroscopy of knee Right   . Tonsillectomy    . Colonoscopy with propofol N/A 01/07/2015    Procedure: COLONOSCOPY WITH PROPOFOL;  Surgeon: Manya Silvas, MD;  Location: [facility] ENDOSCOPY;  Service: Endoscopy;  Laterality: N/A;  . Esophagogastroduodenoscopy N/A 01/07/2015    Procedure: ESOPHAGOGASTRODUODENOSCOPY (EGD);  Surgeon: Manya Silvas, MD;  Location: [facility] ENDOSCOPY;  Service: Endoscopy;  Laterality: N/A;  . Esophagogastroduodenoscopy (egd) with propofol N/A 05/23/2015    Procedure: ESOPHAGOGASTRODUODENOSCOPY (EGD) WITH PROPOFOL;  Surgeon: Manya Silvas, MD;  Location: [facility] ENDOSCOPY;  Service: Endoscopy;  Laterality: N/A;  . Esophagogastroduodenoscopy (egd) with propofol N/A 07/11/2015    Procedure: ESOPHAGOGASTRODUODENOSCOPY (EGD) WITH PROPOFOL with Barrx;  Surgeon: [name], MD;  Location: [facility] ENDOSCOPY;  Service: Gastroenterology;  Laterality: N/A;  . Appendectomy    .  Hemorrhoid surgery  10/02/2013    by simple ligation  . Esophagogastroduodenoscopy (egd) with propofol N/A 09/12/2015    Procedure: ESOPHAGOGASTRODUODENOSCOPY (EGD) WITH PROPOFOL;  Surgeon: [name], MD;  Location: [facility] ENDOSCOPY;  Service: Gastroenterology;  Laterality: N/A;  . Esophagogastroduodenoscopy (egd) with propofol N/A 11/14/2015    Procedure: ESOPHAGOGASTRODUODENOSCOPY (EGD) WITH PROPOFOL;  Surgeon: [name], MD;  Location: [facility] ENDOSCOPY;  Service: Gastroenterology;  Laterality: N/A;    SOCIAL HISTORY:   Social History  Substance Use Topics  . Smoking status: Former Smoker    Quit date: 06/22/1968  . Smokeless tobacco: Never Used  . Alcohol Use: 1.5 oz/week    3 Standard drinks or equivalent per week     Comment: wine x1 a night    FAMILY HISTORY:  Family History  Problem Relation Age of Onset  . Hypertension Mother   . Heart disease Father   . Heart disease Brother     DRUG ALLERGIES:  Allergies  Allergen Reactions  . Lipitor [Atorvastatin] Other (See Comments)    Reaction:  Muscle pain   . Sulfamethoxazole-Trimethoprim Nausea And Vomiting and Other (See Comments)    Reaction:  Shaking   . Welchol [Colesevelam] Other (See Comments)    Reaction:  Muscle pain     Review of Systems  Constitutional: Positive for malaise/fatigue. Negative for fever, chills and weight loss.  HENT: Negative for congestion.   Eyes: Negative for blurred vision and double vision.  Respiratory: Negative for cough, sputum production, shortness of breath and wheezing.   Cardiovascular: Negative for chest pain, palpitations, orthopnea, leg swelling and PND.  Gastrointestinal: Negative for nausea, vomiting, abdominal pain, diarrhea, constipation, blood in stool and melena.  Genitourinary: Negative for dysuria, urgency, frequency and hematuria.  Musculoskeletal: Negative for falls.  Skin: Negative for rash.  Neurological: Positive for weakness. Negative for dizziness.  Psychiatric/Behavioral: Negative for depression and memory loss. The patient is not nervous/anxious.     MEDICATIONS AT HOME:  Prior to Admission medications   Medication Sig Start Date End Date Taking? Authorizing Provider  diclofenac sodium (VOLTAREN) 1 % GEL Apply 2 g topically 4 (four) times daily as needed (for pain).    Yes Historical Provider, MD  esomeprazole (NEXIUM) 20 MG capsule Take 20 mg by mouth daily.    Yes Historical Provider, MD  ipratropium (ATROVENT) 0.03 % nasal spray Place 2 sprays into both nostrils 2 (two)  times daily as needed for rhinitis.   Yes Historical Provider, MD  acetaminophen (TYLENOL) 650 MG CR tablet Take 650 mg by mouth every 8 (eight) hours as needed for pain.    Historical Provider, MD      PHYSICAL EXAMINATION:   VITAL SIGNS: Blood pressure 142/88, pulse 85, temperature 97.7 F (36.5 C), resp. rate 20, height 5\' 3"  (1.6 m), weight 68.04 kg (150 lb), SpO2 97 %.  GENERAL:  75 y.o.-year-old patient lying in the bed with no acute distress, Comfortably sitting on the stretcher, has intermittent right hand tremor, action tremor, mostly.  EYES: Pupils equal, round, reactive to light and accommodation. No scleral icterus. Extraocular muscles intact.  HEENT: Head atraumatic, normocephalic. Oropharynx and nasopharynx clear.  NECK:  Supple, no jugular venous distention. No thyroid enlargement, no tenderness.  LUNGS: Normal breath sounds bilaterally, no wheezing, rales,rhonchi or crepitation. No use of accessory muscles of respiration.  CARDIOVASCULAR: S1, S2 normal. No murmurs, rubs, or gallops.  ABDOMEN: Soft, nontender, nondistended. Bowel sounds present. No organomegaly or mass.  EXTREMITIES: No pedal edema,  cyanosis, or clubbing.  NEUROLOGIC: Cranial nerves II through XII are grossly intact, except of tongue, which deviates to the right on protrusion, mild right periorbital weakness, forehead is spared.. Muscle strength 4/5 in right sided extremities, 5/5 in left-sided extremities. Sensation intact. Gait not checked.  PSYCHIATRIC: The patient is alert and oriented x 3.  SKIN: No obvious rash, lesion, or ulcer.   LABORATORY PANEL:   CBC  Recent Labs Lab 12/18/15 1626  WBC 7.7  HGB 13.6  HCT 40.5  PLT 231  MCV 93.5  MCH 31.3  MCHC 33.5  RDW 13.5  LYMPHSABS 1.3  MONOABS 0.7  EOSABS 0.1  BASOSABS 0.0   ------------------------------------------------------------------------------------------------------------------  Chemistries   Recent Labs Lab 12/18/15 1626  NA  139  K 3.9  CL 103  CO2 26  GLUCOSE 108*  BUN 25*  CREATININE 1.07*  CALCIUM 9.6  AST 26  ALT 17  ALKPHOS 61  BILITOT 0.9   ------------------------------------------------------------------------------------------------------------------  Cardiac Enzymes No results for input(s): TROPONINI in the last 168 hours. ------------------------------------------------------------------------------------------------------------------  RADIOLOGY: Ct Head Code Stroke W/o Cm  12/18/2015  CLINICAL DATA:  Dizziness.  Loss use of hands.  Weakness. EXAM: CT HEAD WITHOUT CONTRAST TECHNIQUE: Contiguous axial images were obtained from the base of the skull through the vertex without intravenous contrast. COMPARISON:  None. FINDINGS: No acute intracranial abnormality. Specifically, no hemorrhage, hydrocephalus, mass lesion, acute infarction, or significant intracranial injury. No acute calvarial abnormality. Visualized paranasal sinuses and mastoids clear. Orbital soft tissues unremarkable. IMPRESSION: No acute intracranial abnormality. Electronically Signed   By: Rolm Baptise M.D.   On: 12/18/2015 16:28    EKG: Orders placed or performed during the hospital encounter of 12/18/15  . ED EKG  . ED EKG  . EKG 12-Lead  . EKG 12-Lead  EKG in the emergency room revealed normal sinus rhythm at 78 bpm, normal axis, no acute ST-T changes  IMPRESSION AND PLAN:  Active Problems:   CVA (cerebral infarction)   Acute renal insufficiency   Tremor   Hyperglycemia   TIA (transient ischemic attack) #1. Stroke with right-sided weakness, tremor, presyncope . Admit patient to medical floor , initiate aspirin, patient is intolerant to statins, get neurologist involved for further recommendations, get echocardiogram, carotid ultrasound, MRI of the brain #2. Acute renal insufficiency, initiate patient on IV fluids, follow patient's creatinine in the morning #3. Hyperglycemia, get hemoglobin A1c to rule out  diabetes #4, tremor, unclear etiology, could be related to current stroke/TIA, supportive therapy #5 . Radicular back pain due to spinal stenosis, pain medications as needed    All the records are reviewed and case discussed with ED provider. Management plans discussed with the patient, family and they are in agreement.  CODE STATUS: Code Status History    This patient does not have a recorded code status. Please follow your organizational policy for patients in this situation.       TOTAL TIME TAKING CARE OF THIS PATIENT: 55 minutes.   Discussed with patient's caregivers./Neighbors Deasha Clendenin M.D on 12/18/2015 at 5:53 PM  Between 7am to 6pm - Pager - [phone] After 6pm go to [email or web address] - password [email or web address] [employer]  [facility]  Office  [phone]  CC: Primary care physician; Adin Hector, MD

## 2015-12-19 ENCOUNTER — Observation Stay: Payer: Medicare Other

## 2015-12-19 ENCOUNTER — Encounter: Payer: Self-pay | Admitting: Radiology

## 2015-12-19 DIAGNOSIS — I639 Cerebral infarction, unspecified: Secondary | ICD-10-CM | POA: Diagnosis not present

## 2015-12-19 DIAGNOSIS — G459 Transient cerebral ischemic attack, unspecified: Secondary | ICD-10-CM

## 2015-12-19 LAB — ECHOCARDIOGRAM COMPLETE
AVLVOTPG: 7 mmHg
E decel time: 243 msec
EERAT: 9.55
FS: 44 % (ref 28–44)
Height: 63 in
IV/PV OW: 1.16
LA ID, A-P, ES: 33 mm
LA diam end sys: 33 mm
LA diam index: 2.13 cm/m2
LA vol: 29.4 mL
LAVOLA4C: 23.7 mL
LAVOLIN: 19 mL/m2
LV TDI E'LATERAL: 8.38
LV e' LATERAL: 8.38 cm/s
LVEEAVG: 9.55
LVEEMED: 9.55
LVOT SV: 73 mL
LVOT VTI: 32.3 cm
LVOT area: 2.27 cm2
LVOT diameter: 17 mm
LVOTPV: 134 cm/s
MV Dec: 243
MV pk E vel: 80 m/s
MVPG: 3 mmHg
MVPKAVEL: 84.4 m/s
PW: 8.9 mm — AB (ref 0.6–1.1)
TAPSE: 22.7 mm
TDI e' medial: 6.31
Weight: 1906 oz

## 2015-12-19 LAB — CBC
HCT: 36.5 % (ref 35.0–47.0)
HEMOGLOBIN: 12.2 g/dL (ref 12.0–16.0)
MCH: 31.8 pg (ref 26.0–34.0)
MCHC: 33.4 g/dL (ref 32.0–36.0)
MCV: 95.2 fL (ref 80.0–100.0)
Platelets: 193 10*3/uL (ref 150–440)
RBC: 3.84 MIL/uL (ref 3.80–5.20)
RDW: 13.5 % (ref 11.5–14.5)
WBC: 5 10*3/uL (ref 3.6–11.0)

## 2015-12-19 LAB — LIPID PANEL
Cholesterol: 247 mg/dL — ABNORMAL HIGH (ref 0–200)
HDL: 70 mg/dL (ref >40)
LDL CALC: 169 mg/dL — AB (ref 0–99)
TRIGLYCERIDES: 38 mg/dL (ref <150)
Total CHOL/HDL Ratio: 3.5 RATIO
VLDL: 8 mg/dL (ref 0–40)

## 2015-12-19 LAB — HEMOGLOBIN A1C: Hgb A1c MFr Bld: 5.5 % (ref 4.0–6.0)

## 2015-12-19 LAB — BASIC METABOLIC PANEL
ANION GAP: 5 (ref 5–15)
BUN: 20 mg/dL (ref 6–20)
CALCIUM: 8.7 mg/dL — AB (ref 8.9–10.3)
CO2: 27 mmol/L (ref 22–32)
Chloride: 108 mmol/L (ref 101–111)
Creatinine, Ser: 0.74 mg/dL (ref 0.44–1.00)
GLUCOSE: 93 mg/dL (ref 65–99)
Potassium: 4 mmol/L (ref 3.5–5.1)
Sodium: 140 mmol/L (ref 135–145)

## 2015-12-19 LAB — SEDIMENTATION RATE: SED RATE: 6 mm/h (ref 0–30)

## 2015-12-19 LAB — C-REACTIVE PROTEIN: CRP: 0.6 mg/dL (ref <1.0)

## 2015-12-19 MED ORDER — LORAZEPAM 2 MG/ML IJ SOLN
1.0000 mg | Freq: Once | INTRAMUSCULAR | Status: AC
  Administered 2015-12-19: 1 mg via INTRAVENOUS
  Filled 2015-12-19: qty 1

## 2015-12-19 MED ORDER — IOPAMIDOL (ISOVUE-370) INJECTION 76%
75.0000 mL | Freq: Once | INTRAVENOUS | Status: AC | PRN
  Administered 2015-12-19: 75 mL via INTRAVENOUS

## 2015-12-19 MED ORDER — EZETIMIBE 10 MG PO TABS
10.0000 mg | ORAL_TABLET | Freq: Every day | ORAL | Status: DC
  Administered 2015-12-19 – 2015-12-20 (×2): 10 mg via ORAL
  Filled 2015-12-19 (×2): qty 1

## 2015-12-19 NOTE — Evaluation (Signed)
Occupational Therapy Evaluation Patient Details Name: Karen Parsons MRN: [id number] DOB: [birth year] Today's Date: 12/19/2015    History of Present Illness Patient reports she was outside working in the yard when she had a pain in her left leg and had difficulty standing.  Her friend got her a chair and gave her some water and she dropped the glass.  She was transported to the hospital and was admitted for observation. MRI negative for CVA.     Clinical Impression   Patient was seen for OT evaluation only.  Patient lives alone in a one story home, she was previously independent with all tasks including heavy household chores.  She feels she is back to baseline.  She was able to demonstrate dressing and toileting with independence this date.  She did not have any significant impairments of coordination, strength 4/5 overall but did appear to have mild tremors with finger to nose testing. It is not anticipated she will need any further OT in the acute setting or at discharge.     Follow Up Recommendations  No OT follow up    Equipment Recommendations       Recommendations for Other Services       Precautions / Restrictions Precautions Precautions: None Restrictions Weight Bearing Restrictions: No      Mobility Bed Mobility Overal bed mobility: Independent                Transfers Overall transfer level: Independent                    Balance                                            ADL Overall ADL's : Independent;At baseline                                       General ADL Comments: Patient able to demonstrate dressing skills independently, independent with toileting.     Vision     Perception     Praxis      Pertinent Vitals/Pain Pain Assessment: No/denies pain     Hand Dominance Right   Extremity/Trunk Assessment Upper Extremity Assessment Upper Extremity Assessment: Overall WFL for tasks assessed    Lower Extremity Assessment Lower Extremity Assessment: Defer to PT evaluation   Cervical / Trunk Assessment Cervical / Trunk Assessment: Normal   Communication Communication Communication: No difficulties   Cognition Arousal/Alertness: Awake/alert Behavior During Therapy: WFL for tasks assessed/performed Overall Cognitive Status: Within Functional Limits for tasks assessed                     General Comments       Exercises       Shoulder Instructions      Home Living Family/patient expects to be discharged to:: Private residence Living Arrangements: Alone Available Help at Discharge: Friend(s);Neighbor Type of Home: House       Home Layout: One level     Bathroom Shower/Tub: Walk-in shower;Door   [employer] Toilet: Standard Bathroom Accessibility: Yes   Home Equipment: Environmental consultant - 2 wheels;Wheelchair - manual   Additional Comments: Was independent with ambulation prior but has some equipment from the past      Prior Functioning/Environment Level of Independence: Independent  OT Diagnosis:     OT Problem List:     OT Treatment/Interventions:      OT Goals(Current goals can be found in the care plan section) Acute Rehab OT Goals Patient Stated Goal: Patient wants to return home to living independently. OT Goal Formulation: With patient Time For Goal Achievement: 12/19/15 Potential to Achieve Goals: Good  OT Frequency:     Barriers to D/C:            Co-evaluation              End of Session Equipment Utilized During Treatment: Gait belt  Activity Tolerance: Patient tolerated treatment well Patient left: in bed;with call bell/phone within reach;with family/visitor present;with nursing/sitter in room   Time: [id number] OT Time Calculation (min): 18 min Charges:  OT General Charges $OT Visit: 1 Procedure OT Evaluation $OT Eval Low Complexity: 1 Procedure G-Codes: OT G-codes **NOT FOR INPATIENT CLASS** Functional  Assessment Tool Used: clinical judgment, ADL assessment, ROM/strength testing Functional Limitation: Self care Self Care Current Status [id number]): 0 percent impaired, limited or restricted Self Care Discharge Status [id number]): 0 percent impaired, limited or restricted  Pravin Perezperez  Brittyn Salaz T Swayzie Choate, OTR/L, CLT  12/19/2015, 11:22 AM

## 2015-12-19 NOTE — Progress Notes (Signed)
Physical Therapy Evaluation Patient Details Name: Karen Parsons MRN: [id number] DOB: 07-07-1940 Today's Date: 12/19/2015   History of Present Illness  Pt is a 75 yr old female presenting with R sided weakness, facial droop, and dizziness; admitted for workup CT and MRI both negative for CVA.    Clinical Impression  Prior to admission, pt was independent with all functional mobility and ADLs; driving, volunteering at the hospital, and ambulating 2 miles 3x/wk for exercise.  Pt lives alone in a one-level home with 2 STE.  Pt self reports supportive friends and neighbors available for assist PRN.   On evaluation, pt is independent with bed mobility, sit <> stand transfers, and ambulation >354ft.  Pt demonstrates ability to ascend/descend 5 steps mod I versus I.  Pt scored 25/28 on the Tinetti Balance Assessment, qualifying her as a low fall risk. Pt endorses that she is at her mobility baseline; requires no PT follow up or equipment. Acute PT signing off; please re-consult should status change.       Follow Up Recommendations No PT follow up    Equipment Recommendations  None recommended by PT    Recommendations for Other Services       Precautions / Restrictions Precautions Precautions: None Restrictions Weight Bearing Restrictions: No      Mobility  Bed Mobility Overal bed mobility: Independent  General bed mobility comments: Supine > sit without cueing or assist; no difficulty noted  Transfers Overall transfer level: Independent  General transfer comment: Pt performs 3 sit <> stand transfers without cueing or physical assist  Ambulation/Gait Ambulation/Gait assistance: Independent Ambulation Distance (Feet): > 300 Feet Assistive device: None Gait Pattern/deviations: WFL(Within Functional Limits);Step-through pattern Gait velocity: WFL General Gait Details: Ambulated >349ft independently; demonstrates ability to perform head turns (up/down, right/left) and vary cadence  without LOB. Does drift to the R when turning head to the R, pt aware and educated to remain cautious.  Stairs Stairs: Yes Stairs assistance: Modified independent (Device/Increase time) Stair Management: One rail Right;No rails Number of Stairs: 5 General stair comments: Pt ascended using unilateral rail on the R (can hold onto house when ascending), decended using 1 rail on the R initially and with no UE support for final 2 steps to mimic home setup.   Wheelchair Mobility    Modified Rankin (Stroke Patients Only)       Balance Overall balance assessment: Independent   Tinetti Balance Assessment was performed and pt scored  -14/16 on balance subscale -11/12 on gait subscale 25/28 total score; indicating pt is at a low risk for falls. Pt lost 3 points for use of UE's to assist with sit > stand, use of UE's to brace with stand > sit, and gait deviation right with head turns.       Pertinent Vitals/Pain Pain Assessment: No/denies pain  HR maintained 70-77 bpm throughout session.     Home Living Family/patient expects to be discharged to:: Private residence Living Arrangements: Alone Available Help at Discharge: Friend(s);Neighbor Type of Home: House Home Access: Stairs to enter Entrance Stairs-Rails: None (can hold onto the house going in) [employer] of Steps: 2 Home Layout: One level Home Equipment: [place] - 2 wheels;Wheelchair - manual Additional Comments: Was independent with ambulation prior but has some equipment from the past    Prior Function Level of Independence: Independent  Comments: Volunteers at hospital, walks 2 miles 3x/week for exercise     Hand Dominance   Dominant Hand: Right    Extremity/Trunk Assessment  Upper Extremity Assessment: Defer to OT evaluation  Lower Extremity Assessment: Overall WFL for tasks assessed 5/5 strength hip flexion, knee flexion/extension, ankle plantar/dorsiflexion bilaterally Light touch sensation,  coordination (heel to shin), and proprioception in tact bilaterally  Cervical / Trunk Assessment: Normal    Communication   Communication: No difficulties  Cognition Arousal/Alertness: Awake/alert Behavior During Therapy: WFL for tasks assessed/performed Overall Cognitive Status: Within Functional Limits for tasks assessed    General Comments General comments (skin integrity, edema, etc.): Nursing cleared pt for participation in physical therapy.  Pt resting supine upon room entry and agreeable to PT session.       Assessment/Plan    PT Assessment Patent does not need any further PT services  PT Diagnosis     PT Problem List    PT Treatment Interventions     PT Goals (Current goals can be found in the Care Plan section) Acute Rehab PT Goals Patient Stated Goal: Patient wants to return home to living independently. PT Goal Formulation: With patient Time For Goal Achievement: 12/26/15 Potential to Achieve Goals: Good    Frequency     Barriers to discharge           End of Session Equipment Utilized During Treatment: Gait belt Activity Tolerance: Patient tolerated treatment well Patient left: in chair;with family/visitor present Nurse Communication: Mobility status (via whiteboard)         [id number] PT Time Calculation (min) (ACUTE ONLY): 20 min   Charges:         PT G Codes:        Carlina Derks, SPT 12/19/2015, 1:29 PM

## 2015-12-19 NOTE — Consult Note (Signed)
Referring Physician: Ether Griffins    Chief Complaint: Right facial twitching  HPI: Karen Parsons is an 75 y.o. female with a known history of hyperlipidemia, not on statins due to intolerance, gastroesophageal reflux disease, osteoporosis, Barrett's esophagus, colon polyps, who presents to the hospital with complaints of dizziness, shaky right arm, feeling presyncopal, slurry speech. Apparently patient was doing well up until yesterday at around 1:59 PM when she started having pain in the left buttock area, felt dizzy, shaky, she felt that she is she was going to pass out, her neighbor helped her to the chair, gave her a bottle of water, which slid out from her hand.  It appears she lost consciousness at that time.  She also had an episode of urinary incontinence and slurring of speech. Episode lasted about 15 minutes.. She reports having droopy right eye about a week ago, resolved in 3 hours. She also reports an episode of dizziness that occurred 4 days ago that resolved spontaneously as well.  Consult called for further evaluation.  Date last known well: 12/18/2015 Time last known well: Time: 13:59 tPA Given: No: Resolution of symptoms  Past Medical History  Diagnosis Date  . Hyperlipidemia     intolerant of statins, normal diagnostic cath July 2010 Nehemiah Massed)  . GERD (gastroesophageal reflux disease)   . Osteoporosis     by DEXA 2008  . S/P colonoscopy 2011    [name], polyp removed  . Barrett's esophagus July 2010    Wohl,   . Hyperlipidemia   . Cramp of limb   . Hemorrhoids   . 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 Hallux valgus   . Colon polyps   . History of hiatal hernia   . Genital prolapse   . Barrett esophagus   . Benign neoplasm of colon   . Bilateral renal cysts   . Generalized osteoarthritis   . Left ovarian cyst     Past Surgical History  Procedure Laterality Date  . Abdominal hysterectomy      age 33, menorrhagia  . Cardiac catheterization Left July 2010     [name]:  normal  . Medial meniscotomy Left 2007    [name]  . Osteotomy toes Right 2008    [name]  . Cataract extraction Left 2010    [name]  . Arthroscopy of knee Right   . Tonsillectomy    . Colonoscopy with propofol N/A 01/07/2015    Procedure: COLONOSCOPY WITH PROPOFOL;  Surgeon: Manya Silvas, MD;  Location: [facility] ENDOSCOPY;  Service: Endoscopy;  Laterality: N/A;  . Esophagogastroduodenoscopy N/A 01/07/2015    Procedure: ESOPHAGOGASTRODUODENOSCOPY (EGD);  Surgeon: Manya Silvas, MD;  Location: [facility] ENDOSCOPY;  Service: Endoscopy;  Laterality: N/A;  . Esophagogastroduodenoscopy (egd) with propofol N/A 05/23/2015    Procedure: ESOPHAGOGASTRODUODENOSCOPY (EGD) WITH PROPOFOL;  Surgeon: Manya Silvas, MD;  Location: [facility] ENDOSCOPY;  Service: Endoscopy;  Laterality: N/A;  . Esophagogastroduodenoscopy (egd) with propofol N/A 07/11/2015    Procedure: ESOPHAGOGASTRODUODENOSCOPY (EGD) WITH PROPOFOL with Barrx;  Surgeon: [name], MD;  Location: [facility] ENDOSCOPY;  Service: Gastroenterology;  Laterality: N/A;  . Appendectomy    . Hemorrhoid surgery  10/02/2013    by simple ligation  . Esophagogastroduodenoscopy (egd) with propofol N/A 09/12/2015    Procedure: ESOPHAGOGASTRODUODENOSCOPY (EGD) WITH PROPOFOL;  Surgeon: [name], MD;  Location: [facility] ENDOSCOPY;  Service: Gastroenterology;  Laterality: N/A;  . Esophagogastroduodenoscopy (egd) with propofol N/A 11/14/2015    Procedure: ESOPHAGOGASTRODUODENOSCOPY (EGD) WITH PROPOFOL;  Surgeon: [name], MD;  Location: [facility] ENDOSCOPY;  Service: Gastroenterology;  Laterality: N/A;    Family History  Problem Relation Age of Onset  . Hypertension Mother   . Heart disease Father   . Heart disease Brother    Social History:  reports that she quit smoking about 47 years ago. She has never used smokeless tobacco. She reports that she drinks about 1.5 oz of alcohol per week. She reports that she does not use illicit drugs.  Allergies:   Allergies  Allergen Reactions  . Lipitor [Atorvastatin] Other (See Comments)    Reaction:  Muscle pain   . Sulfamethoxazole-Trimethoprim Nausea And Vomiting and Other (See Comments)    Reaction:  Shaking   . Welchol [Colesevelam] Other (See Comments)    Reaction:  Muscle pain     Medications:  I have reviewed the patient's current medications. Prior to Admission:  Prescriptions prior to admission  Medication Sig Dispense Refill Last Dose  . acetaminophen (TYLENOL) 650 MG CR tablet Take 1,300 mg by mouth every 8 (eight) hours as needed for pain.    Past Week at Unknown time  . diclofenac sodium (VOLTAREN) 1 % GEL Apply 2 g topically 4 (four) times daily as needed (for pain).    Past Week at Unknown time  . esomeprazole (NEXIUM) 20 MG capsule Take 20 mg by mouth daily.    12/18/2015 at Unknown time  . ipratropium (ATROVENT) 0.03 % nasal spray Place 2 sprays into both nostrils 2 (two) times daily as needed for rhinitis.   12/18/2015 at Unknown time   Scheduled: . aspirin  325 mg Oral Daily  . enoxaparin (LOVENOX) injection  40 mg Subcutaneous Q24H  . ezetimibe  10 mg Oral Daily  . ipratropium  1 spray Each Nare BID  . pantoprazole  40 mg Oral Daily  . sodium chloride flush  3 mL Intravenous Q12H    ROS: History obtained from the patient  General ROS: negative for - chills, fatigue, fever, night sweats, weight gain or weight loss Psychological ROS: negative for - behavioral disorder, hallucinations, memory difficulties, mood swings or suicidal ideation Ophthalmic ROS: negative for - blurry vision, double vision, eye pain or loss of vision ENT ROS: negative for - epistaxis, nasal discharge, oral lesions, sore throat, tinnitus or vertigo Allergy and Immunology ROS: negative for - hives or itchy/watery eyes Hematological and Lymphatic ROS: negative for - bleeding problems, bruising or swollen lymph nodes Endocrine ROS: negative for - galactorrhea, hair pattern changes,  polydipsia/polyuria or temperature intolerance Respiratory ROS: negative for - cough, hemoptysis, shortness of breath or wheezing Cardiovascular ROS: negative for - chest pain, dyspnea on exertion, edema or irregular heartbeat Gastrointestinal ROS: negative for - abdominal pain, diarrhea, hematemesis, nausea/vomiting or stool incontinence Genito-Urinary ROS: negative for - dysuria, hematuria, incontinence or urinary frequency/urgency Musculoskeletal ROS: negative for - joint swelling or muscular weakness Neurological ROS: as noted in HPI Dermatological ROS: negative for rash and skin lesion changes  Physical Examination: Blood pressure 119/74, pulse 65, temperature 97.6 F (36.4 C), temperature source Oral, resp. rate 19, height 5' 3" (1.6 m), weight 54.035 kg (119 lb 2 oz), SpO2 99 %.  HEENT-  Normocephalic, no lesions, without obvious abnormality.  Normal external eye and conjunctiva.  Normal TM's bilaterally.  Normal auditory canals and external ears. Normal external nose, mucus membranes and septum.  Normal pharynx. Cardiovascular- S1, S2 normal, pulses palpable throughout   Lungs- chest clear, no wheezing, rales, normal symmetric air entry Abdomen- soft, non-tender; bowel sounds normal; no  masses,  no organomegaly Extremities- no edema Lymph-no adenopathy palpable Musculoskeletal-no joint tenderness, deformity or swelling Skin-warm and dry, no hyperpigmentation, vitiligo, or suspicious lesions  Neurological Examination Mental Status: Alert, oriented, thought content appropriate.  Speech fluent without evidence of aphasia.  Able to follow 3 step commands without difficulty. Cranial Nerves: II: Discs flat bilaterally; Visual fields grossly normal, pupils equal, round, reactive to light and accommodation III,IV, VI: ptosis not present, extra-ocular motions intact bilaterally V,VII: smile symmetric, facial light touch sensation normal bilaterally VIII: hearing normal bilaterally IX,X:  gag reflex present XI: bilateral shoulder shrug XII: midline tongue extension Motor: Right : Upper extremity   5/5    Left:     Upper extremity   5/5  Lower extremity   5/5     Lower extremity   5/5 Tone and bulk:normal tone throughout; no atrophy noted Sensory: Pinprick and light touch intact throughout, bilaterally Deep Tendon Reflexes: 2+ and symmetric throughout Plantars: Right: downgoing   Left: downgoing Cerebellar: Normal finger-to-nose and normal heel-to-shin testing bilaterally Gait: not tested due to safety concerns    Laboratory Studies:  Basic Metabolic Panel:  Recent Labs Lab 12/18/15 1626 12/18/15 2043 12/19/15 0316  NA 139  --  140  K 3.9  --  4.0  CL 103  --  108  CO2 26  --  27  GLUCOSE 108*  --  93  BUN 25*  --  20  CREATININE 1.07* 0.85 0.74  CALCIUM 9.6  --  8.7*    Liver Function Tests:  Recent Labs Lab 12/18/15 1626  AST 26  ALT 17  ALKPHOS 61  BILITOT 0.9  PROT 7.4  ALBUMIN 4.8   No results for input(s): LIPASE, AMYLASE in the last 168 hours. No results for input(s): AMMONIA in the last 168 hours.  CBC:  Recent Labs Lab 12/18/15 1626 12/18/15 2043 12/19/15 0316  WBC 7.7 6.9 5.0  NEUTROABS 5.6  --   --   HGB 13.6 12.8 12.2  HCT 40.5 38.1 36.5  MCV 93.5 94.6 95.2  PLT 231 205 193    Cardiac Enzymes: No results for input(s): CKTOTAL, CKMB, CKMBINDEX, TROPONINI in the last 168 hours.  BNP: Invalid input(s): POCBNP  CBG: No results for input(s): GLUCAP in the last 168 hours.  Microbiology: Results for orders placed or performed in visit on 07/01/15  Urine culture     Status: None   Collection Time: 07/01/15  3:20 PM  Result Value Ref Range Status   Colony Count NO GROWTH  Final   Organism ID, Bacteria NO GROWTH  Final    Coagulation Studies:  Recent Labs  12/18/15 1626  LABPROT 13.7  INR 1.03    Urinalysis: No results for input(s): COLORURINE, LABSPEC, PHURINE, GLUCOSEU, HGBUR, BILIRUBINUR, KETONESUR,  PROTEINUR, UROBILINOGEN, NITRITE, LEUKOCYTESUR in the last 168 hours.  Invalid input(s): APPERANCEUR  Lipid Panel:    Component Value Date/Time   CHOL 247* 12/19/2015 0316   TRIG 38 12/19/2015 0316   HDL 70 12/19/2015 0316   CHOLHDL 3.5 12/19/2015 0316   VLDL 8 12/19/2015 0316   LDLCALC 169* 12/19/2015 0316    HgbA1C:  Lab Results  Component Value Date   HGBA1C 5.5 12/18/2015    Urine Drug Screen:  No results found for: LABOPIA, COCAINSCRNUR, LABBENZ, AMPHETMU, THCU, LABBARB  Alcohol Level:  Recent Labs Lab 12/18/15 [place] <5    Other results: EKG: sinus rhythm at 78 bpm.  Imaging: Ct Angio Head W Or Wo Contrast  12/19/2015  CLINICAL DATA:  TIA.  Slurred speech and confusion. EXAM: CT ANGIOGRAPHY HEAD AND NECK TECHNIQUE: Multidetector CT imaging of the head and neck was performed using the standard protocol during bolus administration of intravenous contrast. Multiplanar CT image reconstructions and MIPs were obtained to evaluate the vascular anatomy. Carotid stenosis measurements (when applicable) are obtained utilizing NASCET criteria, using the distal internal carotid diameter as the denominator. CONTRAST:  75 mL Isovue 370 IV COMPARISON:  MRI 12/19/2015 FINDINGS: CTA NECK Aortic arch: Mild atherosclerotic calcification aortic arch. No aneurysm or dissection. Proximal great vessels widely patent. Lung apices clear bilaterally. Right carotid system: Right common carotid artery widely patent. Mild atherosclerotic calcification of the carotid bifurcation without significant stenosis. Left carotid system: Left common carotid artery widely patent. Atherosclerotic calcification of the carotid bulb on the left without significant stenosis. Vertebral arteries:Both vertebral arteries patent to the basilar without significant stenosis. Skeleton: Mild cervical spine degenerative change. No fracture or skeletal lesion. Other neck: Left arm contrast injection. Filling of multiple  collateral veins in the neck. There appears to be a significant stenosis of the left innominate vein. CTA HEAD Anterior circulation: Mild atherosclerotic calcification in the cavernous carotid bilaterally without stenosis. Anterior and middle cerebral arteries patent bilaterally without stenosis. Fetal origin left posterior cerebral artery. Posterior circulation: Left vertebral dominant. Both vertebral arteries contribute to the basilar. PICA patent bilaterally. Basilar widely patent. AICA patent bilaterally. Hypoplastic left P1 segment secondary to fetal origin left posterior cerebral artery. Superior cerebellar and posterior cerebral arteries patent bilaterally. Venous sinuses: Patent Anatomic variants: Negative for cerebral vascular malformation Delayed phase: Normal enhancement on delayed imaging. 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 No significant carotid or vertebral artery stenosis in the neck. No significant intracranial stenosis. High-grade stenosis left innominate vein with filling of multiple collateral veins in the neck Electronically Signed   By: Franchot Gallo M.D.   On: 12/19/2015 16:22   Ct Angio Neck W Or Wo Contrast  12/19/2015  CLINICAL DATA:  TIA.  Slurred speech and confusion. EXAM: CT ANGIOGRAPHY HEAD AND NECK TECHNIQUE: Multidetector CT imaging of the head and neck was performed using the standard protocol during bolus administration of intravenous contrast. Multiplanar CT image reconstructions and MIPs were obtained to evaluate the vascular anatomy. Carotid stenosis measurements (when applicable) are obtained utilizing NASCET criteria, using the distal internal carotid diameter as the denominator. CONTRAST:  75 mL Isovue 370 IV COMPARISON:  MRI 12/19/2015 FINDINGS: CTA NECK Aortic arch: Mild atherosclerotic calcification aortic arch. No aneurysm or dissection. Proximal great vessels widely patent. Lung apices clear bilaterally. Right carotid  system: Right common carotid artery widely patent. Mild atherosclerotic calcification of the carotid bifurcation without significant stenosis. Left carotid system: Left common carotid artery widely patent. Atherosclerotic calcification of the carotid bulb on the left without significant stenosis. Vertebral arteries:Both vertebral arteries patent to the basilar without significant stenosis. Skeleton: Mild cervical spine degenerative change. No fracture or skeletal lesion. Other neck: Left arm contrast injection. Filling of multiple collateral veins in the neck. There appears to be a significant stenosis of the left innominate vein. CTA HEAD Anterior circulation: Mild atherosclerotic calcification in the cavernous carotid bilaterally without stenosis. Anterior and middle cerebral arteries patent bilaterally without stenosis. Fetal origin left posterior cerebral artery. Posterior circulation: Left vertebral dominant. Both vertebral arteries contribute to the basilar. PICA patent bilaterally. Basilar widely patent. AICA patent bilaterally. Hypoplastic left P1 segment secondary to fetal origin left posterior cerebral artery. Superior cerebellar and posterior  cerebral arteries patent bilaterally. Venous sinuses: Patent Anatomic variants: Negative for cerebral vascular malformation Delayed phase: Normal enhancement on delayed imaging. 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 No significant carotid or vertebral artery stenosis in the neck. No significant intracranial stenosis. High-grade stenosis left innominate vein with filling of multiple collateral veins in the neck Electronically Signed   By: Franchot Gallo M.D.   On: 12/19/2015 16:22   Mr Brain Wo Contrast  12/19/2015  CLINICAL DATA:  Right-sided weakness EXAM: MRI HEAD WITHOUT CONTRAST TECHNIQUE: Multiplanar, multiecho pulse sequences of the brain and surrounding structures were obtained without intravenous contrast.  COMPARISON:  Head CT from yesterday FINDINGS: Calvarium and upper cervical spine: C3-4, C4-5, and C5-6 disc degeneration with spurring. No focal or aggressive marrow finding. Orbits: Left cataract resection.  No acute finding. Sinuses and Mastoids: No fluid levels. Mucous retention cyst on the floor of the left maxillary sinus. Brain: No acute or remote infarct, hemorrhage, hydrocephalus, or mass lesion. No evidence of large vessel occlusion. Normal cerebral volume. Mild for age chronic microvascular disease with scattered gliosis in the cerebral Delosreyes matter. IMPRESSION: Unremarkable brain MRI for age. Electronically Signed   By: Monte Fantasia M.D.   On: 12/19/2015 10:44   US Carotid Bilateral  12/19/2015  CLINICAL DATA:  Right-sided weakness. EXAM: BILATERAL CAROTID DUPLEX ULTRASOUND TECHNIQUE: Pearline Cables scale imaging, color Doppler and duplex ultrasound were performed of bilateral carotid and vertebral arteries in the neck. COMPARISON:  None. FINDINGS: Criteria: Quantification of carotid stenosis is based on velocity parameters that correlate the residual internal carotid diameter with NASCET-based stenosis levels, using the diameter of the distal internal carotid lumen as the denominator for stenosis measurement. The following velocity measurements were obtained: RIGHT ICA:  59 cm/sec CCA:  68 cm/sec SYSTOLIC ICA/CCA RATIO:  0.9 DIASTOLIC ICA/CCA RATIO:  0.9 ECA:  69 cm/sec LEFT ICA:  67 cm/sec CCA:  53 cm/sec SYSTOLIC ICA/CCA RATIO:  1.3 DIASTOLIC ICA/CCA RATIO:  1.2 ECA:  55 cm/sec RIGHT CAROTID ARTERY: Small amount of plaque at the right carotid bulb. Small amount of echogenic plaque in the proximal internal carotid artery. Normal waveforms and velocities in the right internal carotid artery. RIGHT VERTEBRAL ARTERY: Antegrade flow and normal waveform in the right vertebral artery. LEFT CAROTID ARTERY: Small amount of echogenic plaque at the left carotid bulb. Small amount of echogenic plaque in the proximal  internal carotid artery. Normal waveforms and velocities in left internal carotid artery. LEFT VERTEBRAL ARTERY: Antegrade flow and normal waveform in the left vertebral artery. 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 No significant carotid artery stenosis. Estimated degree of stenosis in the internal carotid arteries is less than 50% bilaterally. Patent vertebral arteries. Electronically Signed   By: Markus Daft M.D.   On: 12/19/2015 09:35   Ct Head Code Stroke W/o Cm  12/18/2015  CLINICAL DATA:  Dizziness.  Loss use of hands.  Weakness. EXAM: CT HEAD WITHOUT CONTRAST TECHNIQUE: Contiguous axial images were obtained from the base of the skull through the vertex without intravenous contrast. COMPARISON:  None. FINDINGS: No acute intracranial abnormality. Specifically, no hemorrhage, hydrocephalus, mass lesion, acute infarction, or significant intracranial injury. No acute calvarial abnormality. Visualized paranasal sinuses and mastoids clear. Orbital soft tissues unremarkable. IMPRESSION: No acute intracranial abnormality. Electronically Signed   By: Rolm Baptise M.D.   On: 12/18/2015 16:28    Assessment: 75 y.o. female with a history of episodes of dizziness, ptosis, facial twitching and syncope.  Concern is  for acute infarct versus seizure.  MRI of the brain personally reviewed and shows no acute changes.  TIA remains on the differential.  All events with features that suggest posterior circulation involvement.  Patient on no antiplatelet therapy at home.  Carotid dopplers show no evidence of hemodynamically significant stenosis.  Echocardiogram shows no cardiac source of emboli with an EF of 60-65%.  A1c 5.5, LDL 169.  Stroke Risk Factors - hyperlipidemia  Plan: 1. CTA of the head and neck for better visualization of the posterior circulation 2. PT consult, OT consult, Speech consult 3. Prophylactic therapy-Antiplatelet med: Aspirin - dose 330m daily 4. Telemetry  monitoring 5. Frequent neuro checks 6. EEG 7. Myasthenia panel, ESR, CRP    LAlexis Goodell MD Neurology [phone]/29/2017, 10:06 PM

## 2015-12-19 NOTE — Care Management (Signed)
Admitted to [facility] under observation status with the diagnosis of CVA. Lives alone. Son is Merry Proud Low [phone]). Sees Dr. Ramonita Lab every 6 months as primary care physician. No home health. No skilled facility. No home oxygen. Takes care of all basic and instrumental activities of daily living herself, drives. No falls. O.K. [employer]etite. Gets prescriptions filled at [facility] on [address]. Neighbor, Sheral Apley, will transport. Shelbie Ammons RN MSN CCM Care Management [phone]

## 2015-12-19 NOTE — Progress Notes (Signed)
Speech Therapy Note: received order, reviewed chart notes. Consulted NSG then pt and friends. Pt denied any difficulty swallowing and is currently on a regular diet; tolerates swallowing pills w/ water per NSG. Pt conversed at conversational level w/out deficits noted; pt and friends denied any speech-language or cognitive deficits.  No further skilled ST services indicated as pt appears at her baseline. Pt agreed. NSG to reconsult if any change in status.

## 2015-12-19 NOTE — Progress Notes (Signed)
[facility] [place] NAME: Karen Parsons    MR#:  [id number]  DATE OF BIRTH:  Sep 14, 1940  SUBJECTIVE: 75 year old female with hyperlipidemia GERD, osteoporosis came in because of dizziness, slurred speech, right hand weakness, some confusion yesterday afternoon. Admitted for CVA workup. MRI of the brain, head CT unremarkable for acute stroke. Because of questionable twitching of the right eye, confusion and incontinence I asked DR.Reynolds  To  see the patient /  CHIEF COMPLAINT:   Chief Complaint  Patient presents with  . Code Stroke    REVIEW OF SYSTEMS:    Review of Systems  Constitutional: Negative for fever and chills.  HENT: Negative for hearing loss.   Eyes: Negative for blurred vision, double vision and photophobia.  Respiratory: Negative for cough, hemoptysis and shortness of breath.   Cardiovascular: Negative for palpitations, orthopnea and leg swelling.  Gastrointestinal: Negative for vomiting, abdominal pain and diarrhea.  Genitourinary: Negative for dysuria and urgency.  Musculoskeletal: Negative for myalgias and neck pain.  Skin: Negative for rash.  Neurological: Negative for dizziness, focal weakness, seizures, weakness and headaches.  Psychiatric/Behavioral: Negative for memory loss. The patient does not have insomnia.     Nutrition: Tolerating Diet: Tolerating PT:      DRUG ALLERGIES:   Allergies  Allergen Reactions  . Lipitor [Atorvastatin] Other (See Comments)    Reaction:  Muscle pain   . Sulfamethoxazole-Trimethoprim Nausea And Vomiting and Other (See Comments)    Reaction:  Shaking   . Welchol [Colesevelam] Other (See Comments)    Reaction:  Muscle pain     VITALS:  Blood pressure 153/97, pulse 64, temperature 98 F (36.7 C), temperature source Oral, resp. rate 18, height 5\' 3"  (1.6 m), weight 54.035 kg (119 lb 2 oz), SpO2 100 %.  PHYSICAL EXAMINATION:   Physical Exam  GENERAL:  75  y.o.-year-old patient lying in the bed with no acute distress.  EYES: Pupils equal, round, reactive to light and accommodation. No scleral icterus. Extraocular muscles intact.  HEENT: Head atraumatic, normocephalic. Oropharynx and nasopharynx clear.  NECK:  Supple, no jugular venous distention. No thyroid enlargement, no tenderness.  LUNGS: Normal breath sounds bilaterally, no wheezing, rales,rhonchi or crepitation. No use of accessory muscles of respiration.  CARDIOVASCULAR: S1, S2 normal. No murmurs, rubs, or gallops.  ABDOMEN: Soft, nontender, nondistended. Bowel sounds present. No organomegaly or mass.  EXTREMITIES: No pedal edema, cyanosis, or clubbing.  NEUROLOGIC: Cranial nerves II through XII are intact. Muscle strength 5/5 in all extremities. Sensation intact. Gait not checked.  PSYCHIATRIC: The patient is alert and oriented x 3.  SKIN: No obvious rash, lesion, or ulcer.    LABORATORY PANEL:   CBC  Recent Labs Lab 12/19/15 0316  WBC 5.0  HGB 12.2  HCT 36.5  PLT 193   ------------------------------------------------------------------------------------------------------------------  Chemistries   Recent Labs Lab 12/18/15 1626  12/19/15 0316  NA 139  --  140  K 3.9  --  4.0  CL 103  --  108  CO2 26  --  27  GLUCOSE 108*  --  93  BUN 25*  --  20  CREATININE 1.07*  < > 0.74  CALCIUM 9.6  --  8.7*  AST 26  --   --   ALT 17  --   --   ALKPHOS 61  --   --   BILITOT 0.9  --   --   < > = values in this interval not  displayed. ------------------------------------------------------------------------------------------------------------------  Cardiac Enzymes No results for input(s): TROPONINI in the last 168 hours. ------------------------------------------------------------------------------------------------------------------  RADIOLOGY:  Mr Brain Wo Contrast  12/19/2015  CLINICAL DATA:  Right-sided weakness EXAM: MRI HEAD WITHOUT CONTRAST TECHNIQUE: Multiplanar,  multiecho pulse sequences of the brain and surrounding structures were obtained without intravenous contrast. COMPARISON:  Head CT from yesterday FINDINGS: Calvarium and upper cervical spine: C3-4, C4-5, and C5-6 disc degeneration with spurring. No focal or aggressive marrow finding. Orbits: Left cataract resection.  No acute finding. Sinuses and Mastoids: No fluid levels. Mucous retention cyst on the floor of the left maxillary sinus. Brain: No acute or remote infarct, hemorrhage, hydrocephalus, or mass lesion. No evidence of large vessel occlusion. Normal cerebral volume. Mild for age chronic microvascular disease with scattered gliosis in the cerebral [place] matter. IMPRESSION: Unremarkable brain MRI for age. Electronically Signed   By: Monte Fantasia M.D.   On: 12/19/2015 10:44   US Carotid Bilateral  12/19/2015  CLINICAL DATA:  Right-sided weakness. EXAM: BILATERAL CAROTID DUPLEX ULTRASOUND TECHNIQUE: Pearline Cables scale imaging, color Doppler and duplex ultrasound were performed of bilateral carotid and vertebral arteries in the neck. COMPARISON:  None. FINDINGS: Criteria: Quantification of carotid stenosis is based on velocity parameters that correlate the residual internal carotid diameter with NASCET-based stenosis levels, using the diameter of the distal internal carotid lumen as the denominator for stenosis measurement. The following velocity measurements were obtained: RIGHT ICA:  59 cm/sec CCA:  68 cm/sec SYSTOLIC ICA/CCA RATIO:  0.9 DIASTOLIC ICA/CCA RATIO:  0.9 ECA:  69 cm/sec LEFT ICA:  67 cm/sec CCA:  53 cm/sec SYSTOLIC ICA/CCA RATIO:  1.3 DIASTOLIC ICA/CCA RATIO:  1.2 ECA:  55 cm/sec RIGHT CAROTID ARTERY: Small amount of plaque at the right carotid bulb. Small amount of echogenic plaque in the proximal internal carotid artery. Normal waveforms and velocities in the right internal carotid artery. RIGHT VERTEBRAL ARTERY: Antegrade flow and normal waveform in the right vertebral artery. LEFT CAROTID ARTERY:  Small amount of echogenic plaque at the left carotid bulb. Small amount of echogenic plaque in the proximal internal carotid artery. Normal waveforms and velocities in left internal carotid artery. LEFT VERTEBRAL ARTERY: Antegrade flow and normal waveform in the left vertebral artery. 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 No significant carotid artery stenosis. Estimated degree of stenosis in the internal carotid arteries is less than 50% bilaterally. Patent vertebral arteries. Electronically Signed   By: Markus Daft M.D.   On: 12/19/2015 09:35   Ct Head Code Stroke W/o Cm  12/18/2015  CLINICAL DATA:  Dizziness.  Loss use of hands.  Weakness. EXAM: CT HEAD WITHOUT CONTRAST TECHNIQUE: Contiguous axial images were obtained from the base of the skull through the vertex without intravenous contrast. COMPARISON:  None. FINDINGS: No acute intracranial abnormality. Specifically, no hemorrhage, hydrocephalus, mass lesion, acute infarction, or significant intracranial injury. No acute calvarial abnormality. Visualized paranasal sinuses and mastoids clear. Orbital soft tissues unremarkable. IMPRESSION: No acute intracranial abnormality. Electronically Signed   By: Rolm Baptise M.D.   On: 12/18/2015 16:28     ASSESSMENT AND PLAN:   Active Problems:   CVA (cerebral infarction)   Acute renal insufficiency   Hyperglycemia   Tremor   TIA (transient ischemic attack)   #18.75 year old female patient with slurred speech, confusion, right-sided eye tremor: CT head negative for stroke, MRI of the brain also was negative for stroke, ultrasound of carotids did not show any hemodynamically significant stenosis. Echocardiogram results are pending. Patient does not have  any weakness or slurred speech anymore. Because of concern for the postictal state or some other neurological issue I ask dr.Reynolds to to see the patient. Patient mentioned that a week ago patient had a problem with right eye  vision.  #2 hyperlipidemia: Intolerant to statins. We will try Zetia. LDL more than 100. #3 GERD continue PPIs  Discussed with the patient and also told her that neurology consult is planned. Likely discharge tomorrow.  All the records are reviewed and case discussed with Care Management/Social Workerr. Management plans discussed with the patient, family and they are in agreement.  CODE STATUS: stable  TOTAL TIME TAKING CARE OF THIS PATIENT: 35 minutes.   POSSIBLE D/C IN 1-2 DAYS, DEPENDING ON CLINICAL CONDITION.   Epifanio Lesches M.D on 12/19/2015 at 1:12 PM  Between 7am to 6pm - Pager - [phone]  After 6pm go to [email or web address] - password [email or web address] [facility]  [facility]  Office  [phone]  CC: Primary care physician; Adin Hector, MD

## 2015-12-19 NOTE — Care Management Obs Status (Signed)
[facility] NOTIFICATION   Patient Details  Name: Karen Parsons MRN: [id number] Date of Birth: 1941/02/10   Medicare Observation Status Notification Given:  Yes    Shelbie Ammons, RN 12/19/2015, 8:19 AM

## 2015-12-20 ENCOUNTER — Observation Stay: Admit: 2015-12-20 | Payer: Medicare Other

## 2015-12-20 ENCOUNTER — Observation Stay: Payer: Medicare Other

## 2015-12-20 DIAGNOSIS — I639 Cerebral infarction, unspecified: Secondary | ICD-10-CM | POA: Diagnosis not present

## 2015-12-20 DIAGNOSIS — G459 Transient cerebral ischemic attack, unspecified: Secondary | ICD-10-CM | POA: Diagnosis not present

## 2015-12-20 MED ORDER — METOPROLOL TARTRATE 25 MG PO TABS
25.0000 mg | ORAL_TABLET | Freq: Once | ORAL | Status: AC
  Administered 2015-12-20: 09:00:00 25 mg via ORAL
  Filled 2015-12-20: qty 1

## 2015-12-20 MED ORDER — ASPIRIN 325 MG PO TABS: 325.0000 mg | ORAL_TABLET | Freq: Every day | ORAL | Status: DC

## 2015-12-20 MED ORDER — METOPROLOL TARTRATE 25 MG PO TABS: 12.5000 mg | ORAL_TABLET | Freq: Two times a day (BID) | ORAL | Status: DC

## 2015-12-20 MED ORDER — ENOXAPARIN SODIUM 40 MG/0.4ML ~~LOC~~ SOLN: 40.0000 mg | SUBCUTANEOUS | Status: DC

## 2015-12-20 MED ORDER — EZETIMIBE 10 MG PO TABS: 10.0000 mg | ORAL_TABLET | Freq: Every day | ORAL | Status: DC

## 2015-12-20 NOTE — Discharge Summary (Signed)
Karen Parsons, is a 75 y.o. female  DOB August 24, 1940  MRN [id number].  Admission date:  12/18/2015  Admitting Physician  Theodoro Grist, MD  Discharge Date:  12/20/2015   Primary MD  BERT Briscoe Burns III, MD  Recommendations for primary care physician for things to follow:    follow up with primary doctor in 1 week   Admission Diagnosis  Right sided weakness [M62.89] Cerebral infarction due to unspecified mechanism [I63.9]   Discharge Diagnosis  Right sided weakness [M62.89] Cerebral infarction due to unspecified mechanism [I63.9]    Active Problems:   CVA (cerebral infarction)   Acute renal insufficiency   Hyperglycemia   Tremor   TIA (transient ischemic attack)      Past Medical History  Diagnosis Date  . Hyperlipidemia     intolerant of statins, normal diagnostic cath July 2010 Nehemiah Massed)  . GERD (gastroesophageal reflux disease)   . Osteoporosis     by DEXA 2008  . S/P colonoscopy 2011    [name], polyp removed  . Barrett's esophagus July 2010    Wohl,   . Hyperlipidemia   . Cramp of limb   . Hemorrhoids   . 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 Hallux valgus   . Colon polyps   . History of hiatal hernia   . Genital prolapse   . Barrett esophagus   . Benign neoplasm of colon   . Bilateral renal cysts   . Generalized osteoarthritis   . Left ovarian cyst     Past Surgical History  Procedure Laterality Date  . Abdominal hysterectomy      age 16, menorrhagia  . Cardiac catheterization Left July 2010    [name]:  normal  . Medial meniscotomy Left 2007    [name]  . Osteotomy toes Right 2008    [name]  . Cataract extraction Left 2010    [name]  . Arthroscopy of knee Right   . Tonsillectomy    . Colonoscopy with propofol N/A 01/07/2015    Procedure: COLONOSCOPY WITH PROPOFOL;  Surgeon:  Manya Silvas, MD;  Location: [facility] ENDOSCOPY;  Service: Endoscopy;  Laterality: N/A;  . Esophagogastroduodenoscopy N/A 01/07/2015    Procedure: ESOPHAGOGASTRODUODENOSCOPY (EGD);  Surgeon: Manya Silvas, MD;  Location: [facility] ENDOSCOPY;  Service: Endoscopy;  Laterality: N/A;  . Esophagogastroduodenoscopy (egd) with propofol N/A 05/23/2015    Procedure: ESOPHAGOGASTRODUODENOSCOPY (EGD) WITH PROPOFOL;  Surgeon: Manya Silvas, MD;  Location: [facility] ENDOSCOPY;  Service: Endoscopy;  Laterality: N/A;  . Esophagogastroduodenoscopy (egd) with propofol N/A 07/11/2015    Procedure: ESOPHAGOGASTRODUODENOSCOPY (EGD) WITH PROPOFOL with Barrx;  Surgeon: [name], MD;  Location: [facility] ENDOSCOPY;  Service: Gastroenterology;  Laterality: N/A;  . Appendectomy    . Hemorrhoid surgery  10/02/2013    by simple ligation  . Esophagogastroduodenoscopy (egd) with propofol N/A 09/12/2015    Procedure: ESOPHAGOGASTRODUODENOSCOPY (EGD) WITH PROPOFOL;  Surgeon: [name], MD;  Location: [facility] ENDOSCOPY;  Service: Gastroenterology;  Laterality: N/A;  . Esophagogastroduodenoscopy (egd) with propofol N/A 11/14/2015    Procedure: ESOPHAGOGASTRODUODENOSCOPY (EGD) WITH PROPOFOL;  Surgeon: [name], MD;  Location: [facility] ENDOSCOPY;  Service: Gastroenterology;  Laterality: N/A;       History of present illness and  Hospital Course:     Kindly see H&P for history of present illness and admission details, please review complete Labs, Consult reports and Test reports for all details in brief  HPI  from the history and physical done on the day of admission  75 year old female patient with history of hyperlipidemia, not on statin secondary to intolerance with muscle pains, GERD, Barrett's esophagus comes to the hospital because of dizziness, shaking in the right arm, presyncopal episode with slurred speech. Patient felt dizzy, shaky and was about to pass out at home at that time she also had incontinence of the urine , slurred speech.  Admitted for evaluation of CVA/TIA.  Hospital Course  #94.75 year old female patient with the dizziness, ptosis, facial twitching and syncope:  Work up acute infarct is negative including brain MRI ultrasound of carotids, CT angiography head and neck. Echocardiogram showed EF more than 65%. Symptoms are concerning for seizures, seen by Dr. and also neurology, EEG is pending. Patient started on antiplatelet therapy with aspirin 325 mg by mouth daily. EKG showed a normal sinus rhythm. Echocardiogram did not show any source of emboli. Patient LDL is 169. All the even suggest posterior circulation involvement. Myasthenia gravis workup was ordered by neurology. Patient's CRP is within normal limits at 0.6. ESR levels are also at 6/. EEG is negative for any seizure activity, patient can be discharged home. Discussed this with patient and patient's daughter in law  In detail. . Carotid ultrasound showed bilateral atherosclerotic disease rather than any hemodynamically significant stenosis. Results are discussed with the patient and also patient to daughter-in-law. I spent about 30 minutes in discussing the results with the patient and patient's daughter-in-law.  #2 essential hypertension without prior diagnosis: Blood pressure elevated to 163/94 this morning. And she also felt lightheaded and also fuzzy  Vision in left eye. Because of elevated blood pressure with symptoms give 1 dose of metoprolol. Goal the patient and her daughter-in-law to have a BP machine at home and keep the log of blood pressures and if it is more than 130/80. Patient can take metoprolol 25 mg daily, keep a log of blood pressure checks and take it to the primary doctor   for further management. Patient and patient's daughter-in-law understands this.  #3 history of Barrets  esophagus;follows up with Dr. Candace Cruise, had treatments done continue PPIs, continue aspirin enteric-coated 325 mg by mouth daily.  4 hyperlipidemia: Patient is allergic to  statins causes severe muscle pains: LDL is 169, cholesterol 247, HDL 70: Started on Zetia . Discussed that we are going to start her on Zetia. If she gets muscle pains she is will stop this one. Advised to continue Fish oil 1 g 4 times daily. And she also walks everyday 2 miles advised her and encouraged her to keep doing the exercise. Discharge Condition: stable   Follow UP      Discharge Instructions  and  Discharge Medications        Medication List    TAKE these medications        acetaminophen 650 MG CR tablet  Commonly known as:  TYLENOL  Take 1,300 mg by mouth every 8 (eight) hours as needed for pain.     diclofenac sodium 1 % Gel  Commonly known as:  VOLTAREN  Apply 2 g topically 4 (four) times daily as needed (for pain).     esomeprazole 20 MG capsule  Commonly known as:  NEXIUM  Take 20 mg by mouth daily.     ipratropium 0.03 % nasal spray  Commonly known as:  ATROVENT  Place 2 sprays into both nostrils 2 (two) times daily as needed for rhinitis.          Diet and Activity recommendation: See Discharge Instructions above   Consults  obtained - neuro   Major procedures and Radiology Reports - PLEASE review detailed and final reports for all details, in brief -     Ct Angio Head W Or Wo Contrast  12/19/2015  CLINICAL DATA:  TIA.  Slurred speech and confusion. EXAM: CT ANGIOGRAPHY HEAD AND NECK TECHNIQUE: Multidetector CT imaging of the head and neck was performed using the standard protocol during bolus administration of intravenous contrast. Multiplanar CT image reconstructions and MIPs were obtained to evaluate the vascular anatomy. Carotid stenosis measurements (when applicable) are obtained utilizing NASCET criteria, using the distal internal carotid diameter as the denominator. CONTRAST:  75 mL Isovue 370 IV COMPARISON:  MRI 12/19/2015 FINDINGS: CTA NECK Aortic arch: Mild atherosclerotic calcification aortic arch. No aneurysm or dissection. Proximal  great vessels widely patent. Lung apices clear bilaterally. Right carotid system: Right common carotid artery widely patent. Mild atherosclerotic calcification of the carotid bifurcation without significant stenosis. Left carotid system: Left common carotid artery widely patent. Atherosclerotic calcification of the carotid bulb on the left without significant stenosis. Vertebral arteries:Both vertebral arteries patent to the basilar without significant stenosis. Skeleton: Mild cervical spine degenerative change. No fracture or skeletal lesion. Other neck: Left arm contrast injection. Filling of multiple collateral veins in the neck. There appears to be a significant stenosis of the left innominate vein. CTA HEAD Anterior circulation: Mild atherosclerotic calcification in the cavernous carotid bilaterally without stenosis. Anterior and middle cerebral arteries patent bilaterally without stenosis. Fetal origin left posterior cerebral artery. Posterior circulation: Left vertebral dominant. Both vertebral arteries contribute to the basilar. PICA patent bilaterally. Basilar widely patent. AICA patent bilaterally. Hypoplastic left P1 segment secondary to fetal origin left posterior cerebral artery. Superior cerebellar and posterior cerebral arteries patent bilaterally. Venous sinuses: Patent Anatomic variants: Negative for cerebral vascular malformation Delayed phase: Normal enhancement on delayed imaging. 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 No significant carotid or vertebral artery stenosis in the neck. No significant intracranial stenosis. High-grade stenosis left innominate vein with filling of multiple collateral veins in the neck Electronically Signed   By: Franchot Gallo M.D.   On: 12/19/2015 16:22   Ct Angio Neck W Or Wo Contrast  12/19/2015  CLINICAL DATA:  TIA.  Slurred speech and confusion. EXAM: CT ANGIOGRAPHY HEAD AND NECK TECHNIQUE: Multidetector CT imaging of  the head and neck was performed using the standard protocol during bolus administration of intravenous contrast. Multiplanar CT image reconstructions and MIPs were obtained to evaluate the vascular anatomy. Carotid stenosis measurements (when applicable) are obtained utilizing NASCET criteria, using the distal internal carotid diameter as the denominator. CONTRAST:  75 mL Isovue 370 IV COMPARISON:  MRI 12/19/2015 FINDINGS: CTA NECK Aortic arch: Mild atherosclerotic calcification aortic arch. No aneurysm or dissection. Proximal great vessels widely patent. Lung apices clear bilaterally. Right carotid system: Right common carotid artery widely patent. Mild atherosclerotic calcification of the carotid bifurcation without significant stenosis. Left carotid system: Left common carotid artery widely patent. Atherosclerotic calcification of the carotid bulb on the left without significant stenosis. Vertebral arteries:Both vertebral arteries patent to the basilar without significant stenosis. Skeleton: Mild cervical spine degenerative change. No fracture or skeletal lesion. Other neck: Left arm contrast injection. Filling of multiple collateral veins in the neck. There appears to be a significant stenosis of the left innominate vein. CTA HEAD Anterior circulation: Mild atherosclerotic calcification in the cavernous carotid bilaterally without stenosis. Anterior and middle cerebral arteries patent bilaterally without stenosis. Fetal origin left posterior cerebral artery. Posterior circulation:  Left vertebral dominant. Both vertebral arteries contribute to the basilar. PICA patent bilaterally. Basilar widely patent. AICA patent bilaterally. Hypoplastic left P1 segment secondary to fetal origin left posterior cerebral artery. Superior cerebellar and posterior cerebral arteries patent bilaterally. Venous sinuses: Patent Anatomic variants: Negative for cerebral vascular malformation Delayed phase: Normal enhancement on delayed  imaging. 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 No significant carotid or vertebral artery stenosis in the neck. No significant intracranial stenosis. High-grade stenosis left innominate vein with filling of multiple collateral veins in the neck Electronically Signed   By: Franchot Gallo M.D.   On: 12/19/2015 16:22   Mr Brain Wo Contrast  12/19/2015  CLINICAL DATA:  Right-sided weakness EXAM: MRI HEAD WITHOUT CONTRAST TECHNIQUE: Multiplanar, multiecho pulse sequences of the brain and surrounding structures were obtained without intravenous contrast. COMPARISON:  Head CT from yesterday FINDINGS: Calvarium and upper cervical spine: C3-4, C4-5, and C5-6 disc degeneration with spurring. No focal or aggressive marrow finding. Orbits: Left cataract resection.  No acute finding. Sinuses and Mastoids: No fluid levels. Mucous retention cyst on the floor of the left maxillary sinus. Brain: No acute or remote infarct, hemorrhage, hydrocephalus, or mass lesion. No evidence of large vessel occlusion. Normal cerebral volume. Mild for age chronic microvascular disease with scattered gliosis in the cerebral [place] matter. IMPRESSION: Unremarkable brain MRI for age. Electronically Signed   By: Monte Fantasia M.D.   On: 12/19/2015 10:44   US Carotid Bilateral  12/19/2015  CLINICAL DATA:  Right-sided weakness. EXAM: BILATERAL CAROTID DUPLEX ULTRASOUND TECHNIQUE: Pearline Cables scale imaging, color Doppler and duplex ultrasound were performed of bilateral carotid and vertebral arteries in the neck. COMPARISON:  None. FINDINGS: Criteria: Quantification of carotid stenosis is based on velocity parameters that correlate the residual internal carotid diameter with NASCET-based stenosis levels, using the diameter of the distal internal carotid lumen as the denominator for stenosis measurement. The following velocity measurements were obtained: RIGHT ICA:  59 cm/sec CCA:  68 cm/sec SYSTOLIC ICA/CCA  RATIO:  0.9 DIASTOLIC ICA/CCA RATIO:  0.9 ECA:  69 cm/sec LEFT ICA:  67 cm/sec CCA:  53 cm/sec SYSTOLIC ICA/CCA RATIO:  1.3 DIASTOLIC ICA/CCA RATIO:  1.2 ECA:  55 cm/sec RIGHT CAROTID ARTERY: Small amount of plaque at the right carotid bulb. Small amount of echogenic plaque in the proximal internal carotid artery. Normal waveforms and velocities in the right internal carotid artery. RIGHT VERTEBRAL ARTERY: Antegrade flow and normal waveform in the right vertebral artery. LEFT CAROTID ARTERY: Small amount of echogenic plaque at the left carotid bulb. Small amount of echogenic plaque in the proximal internal carotid artery. Normal waveforms and velocities in left internal carotid artery. LEFT VERTEBRAL ARTERY: Antegrade flow and normal waveform in the left vertebral artery. 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 No significant carotid artery stenosis. Estimated degree of stenosis in the internal carotid arteries is less than 50% bilaterally. Patent vertebral arteries. Electronically Signed   By: Markus Daft M.D.   On: 12/19/2015 09:35   Ct Head Code Stroke W/o Cm  12/18/2015  CLINICAL DATA:  Dizziness.  Loss use of hands.  Weakness. EXAM: CT HEAD WITHOUT CONTRAST TECHNIQUE: Contiguous axial images were obtained from the base of the skull through the vertex without intravenous contrast. COMPARISON:  None. FINDINGS: No acute intracranial abnormality. Specifically, no hemorrhage, hydrocephalus, mass lesion, acute infarction, or significant intracranial injury. No acute calvarial abnormality. Visualized paranasal sinuses and mastoids clear. Orbital soft tissues unremarkable. IMPRESSION: No acute intracranial abnormality. Electronically  Signed   By: Rolm Baptise M.D.   On: 12/18/2015 16:28    Micro Results    No results found for this or any previous visit (from the past 240 hour(s)).     Today   Subjective:   Fumiye Lubben today has no headache,no chest abdominal pain,no new  weakness tingling or numbness, feels much better wants to go home today.   Objective:   Blood pressure 153/92, pulse 63, temperature 97.6 F (36.4 C), temperature source Oral, resp. rate 17, height _0  (1.6 m), weight 54.035 kg (119 lb 2 oz), SpO2 98 %.   Intake/Output Summary (Last 24 hours) at 12/20/15 1043 Last data filed at 12/20/15 0743  Gross per 24 hour  Intake    600 ml  Output    400 ml  Net    200 ml    Exam Awake Alert, Oriented x 3, No new F.N deficits, Normal affect [place].AT,PERRAL Supple Neck,No JVD, No cervical lymphadenopathy appriciated.  Symmetrical Chest wall movement, Good air movement bilaterally, CTAB RRR,No Gallops,Rubs or new Murmurs, No Parasternal Heave +ve B.Sounds, Abd Soft, Non tender, No organomegaly appriciated, No rebound -guarding or rigidity. No Cyanosis, Clubbing or edema, No new Rash or bruise  Data Review   CBC w Diff: Lab Results  Component Value Date   WBC 5.0 12/19/2015   HGB 12.2 12/19/2015   HCT 36.5 12/19/2015   PLT 193 12/19/2015   LYMPHOPCT 16 12/18/2015   MONOPCT 9 12/18/2015   EOSPCT 1 12/18/2015   BASOPCT 1 12/18/2015    CMP: Lab Results  Component Value Date   NA 140 12/19/2015   K 4.0 12/19/2015   CL 108 12/19/2015   CO2 27 12/19/2015   BUN 20 12/19/2015   CREATININE 0.74 12/19/2015   PROT 7.4 12/18/2015   ALBUMIN 4.8 12/18/2015   BILITOT 0.9 12/18/2015   ALKPHOS 61 12/18/2015   AST 26 12/18/2015   ALT 17 12/18/2015  .   Total Time in preparing paper work, data evaluation and todays exam - 70 minutes  Keagon Glascoe M.D on 12/20/2015 at 10:43 AM    Note: This dictation was prepared with Dragon dictation along with smaller phrase technology. Any transcriptional errors that result from this process are unintentional.

## 2015-12-20 NOTE — Progress Notes (Signed)
Subjective: Patient remains at baseline.  Had one episode earlier of dizziness with blurry vision that was short lived.  BP elevated at that time.    Objective: Current vital signs: BP 134/90 mmHg  Pulse 63  Temp(Src) 97.6 F (36.4 C) (Oral)  Resp 17  Ht '5\' 3"'$  (1.6 m)  Wt 54.035 kg (119 lb 2 oz)  BMI 21.11 kg/m2  SpO2 98% Vital signs in last 24 hours: Temp:  [97.6 F (36.4 C)-97.8 F (36.6 C)] 97.6 F (36.4 C) (06/30 0900) Pulse Rate:  [63-70] 63 (06/30 0612) Resp:  [17-19] 17 (06/30 0900) BP: (111-163)/(58-97) 134/90 mmHg (06/30 1154) SpO2:  [98 %-99 %] 98 % (06/30 0900)  Intake/Output from previous day: 06/29 0701 - 06/30 0700 In: 360 [P.O.:360] Out: 800 [Urine:800] Intake/Output this shift: Total I/O In: 480 [P.O.:480] Out: -  Nutritional status: Diet regular Room service appropriate?: Yes; Fluid consistency:: Thin  Neurologic Exam: Mental Status: Alert, oriented, thought content appropriate. Speech fluent without evidence of aphasia. Able to follow 3 step commands without difficulty. Cranial Nerves: II: Discs flat bilaterally; Visual fields grossly normal, pupils equal, round, reactive to light and accommodation III,IV, VI: ptosis not present, extra-ocular motions intact bilaterally V,VII: smile symmetric, facial light touch sensation normal bilaterally VIII: hearing normal bilaterally IX,X: gag reflex present XI: bilateral shoulder shrug XII: midline tongue extension Motor: Right :Upper extremity 5/5Left: Upper extremity 5/5 Lower extremity 5/5Lower extremity 5/5 Tone and bulk:normal tone throughout; no atrophy noted Sensory: Pinprick and light touch intact throughout, bilaterally  Lab Results: Basic Metabolic Panel:  Recent Labs Lab 12/18/15 1626 12/18/15 2043 12/19/15 0316  NA 139  --  140  K 3.9  --  4.0  CL 103  --  108  CO2 26  --   27  GLUCOSE 108*  --  93  BUN 25*  --  20  CREATININE 1.07* 0.85 0.74  CALCIUM 9.6  --  8.7*    Liver Function Tests:  Recent Labs Lab 12/18/15 1626  AST 26  ALT 17  ALKPHOS 61  BILITOT 0.9  PROT 7.4  ALBUMIN 4.8   No results for input(s): LIPASE, AMYLASE in the last 168 hours. No results for input(s): AMMONIA in the last 168 hours.  CBC:  Recent Labs Lab 12/18/15 1626 12/18/15 2043 12/19/15 0316  WBC 7.7 6.9 5.0  NEUTROABS 5.6  --   --   HGB 13.6 12.8 12.2  HCT 40.5 38.1 36.5  MCV 93.5 94.6 95.2  PLT 231 205 193    Cardiac Enzymes: No results for input(s): CKTOTAL, CKMB, CKMBINDEX, TROPONINI in the last 168 hours.  Lipid Panel:  Recent Labs Lab 12/19/15 0316  CHOL 247*  TRIG 38  HDL 70  CHOLHDL 3.5  VLDL 8  LDLCALC 169*    CBG: No results for input(s): GLUCAP in the last 168 hours.  Microbiology: Results for orders placed or performed in visit on 07/01/15  Urine culture     Status: None   Collection Time: 07/01/15  3:20 PM  Result Value Ref Range Status   Colony Count NO GROWTH  Final   Organism ID, Bacteria NO GROWTH  Final    Coagulation Studies:  Recent Labs  12/18/15 1626  LABPROT 13.7  INR 1.03    Imaging: Ct Angio Head W Or Wo Contrast  12/19/2015  CLINICAL DATA:  TIA.  Slurred speech and confusion. EXAM: CT ANGIOGRAPHY HEAD AND NECK TECHNIQUE: Multidetector CT imaging of the head and neck was performed using  the standard protocol during bolus administration of intravenous contrast. Multiplanar CT image reconstructions and MIPs were obtained to evaluate the vascular anatomy. Carotid stenosis measurements (when applicable) are obtained utilizing NASCET criteria, using the distal internal carotid diameter as the denominator. CONTRAST:  75 mL Isovue 370 IV COMPARISON:  MRI 12/19/2015 FINDINGS: CTA NECK Aortic arch: Mild atherosclerotic calcification aortic arch. No aneurysm or dissection. Proximal great vessels widely patent. Lung  apices clear bilaterally. Right carotid system: Right common carotid artery widely patent. Mild atherosclerotic calcification of the carotid bifurcation without significant stenosis. Left carotid system: Left common carotid artery widely patent. Atherosclerotic calcification of the carotid bulb on the left without significant stenosis. Vertebral arteries:Both vertebral arteries patent to the basilar without significant stenosis. Skeleton: Mild cervical spine degenerative change. No fracture or skeletal lesion. Other neck: Left arm contrast injection. Filling of multiple collateral veins in the neck. There appears to be a significant stenosis of the left innominate vein. CTA HEAD Anterior circulation: Mild atherosclerotic calcification in the cavernous carotid bilaterally without stenosis. Anterior and middle cerebral arteries patent bilaterally without stenosis. Fetal origin left posterior cerebral artery. Posterior circulation: Left vertebral dominant. Both vertebral arteries contribute to the basilar. PICA patent bilaterally. Basilar widely patent. AICA patent bilaterally. Hypoplastic left P1 segment secondary to fetal origin left posterior cerebral artery. Superior cerebellar and posterior cerebral arteries patent bilaterally. Venous sinuses: Patent Anatomic variants: Negative for cerebral vascular malformation Delayed phase: Normal enhancement on delayed imaging. 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 No significant carotid or vertebral artery stenosis in the neck. No significant intracranial stenosis. High-grade stenosis left innominate vein with filling of multiple collateral veins in the neck Electronically Signed   By: Franchot Gallo M.D.   On: 12/19/2015 16:22   Ct Angio Neck W Or Wo Contrast  12/19/2015  CLINICAL DATA:  TIA.  Slurred speech and confusion. EXAM: CT ANGIOGRAPHY HEAD AND NECK TECHNIQUE: Multidetector CT imaging of the head and neck was performed  using the standard protocol during bolus administration of intravenous contrast. Multiplanar CT image reconstructions and MIPs were obtained to evaluate the vascular anatomy. Carotid stenosis measurements (when applicable) are obtained utilizing NASCET criteria, using the distal internal carotid diameter as the denominator. CONTRAST:  75 mL Isovue 370 IV COMPARISON:  MRI 12/19/2015 FINDINGS: CTA NECK Aortic arch: Mild atherosclerotic calcification aortic arch. No aneurysm or dissection. Proximal great vessels widely patent. Lung apices clear bilaterally. Right carotid system: Right common carotid artery widely patent. Mild atherosclerotic calcification of the carotid bifurcation without significant stenosis. Left carotid system: Left common carotid artery widely patent. Atherosclerotic calcification of the carotid bulb on the left without significant stenosis. Vertebral arteries:Both vertebral arteries patent to the basilar without significant stenosis. Skeleton: Mild cervical spine degenerative change. No fracture or skeletal lesion. Other neck: Left arm contrast injection. Filling of multiple collateral veins in the neck. There appears to be a significant stenosis of the left innominate vein. CTA HEAD Anterior circulation: Mild atherosclerotic calcification in the cavernous carotid bilaterally without stenosis. Anterior and middle cerebral arteries patent bilaterally without stenosis. Fetal origin left posterior cerebral artery. Posterior circulation: Left vertebral dominant. Both vertebral arteries contribute to the basilar. PICA patent bilaterally. Basilar widely patent. AICA patent bilaterally. Hypoplastic left P1 segment secondary to fetal origin left posterior cerebral artery. Superior cerebellar and posterior cerebral arteries patent bilaterally. Venous sinuses: Patent Anatomic variants: Negative for cerebral vascular malformation Delayed phase: Normal enhancement on delayed imaging. IMPRESSION: Mild  atherosclerotic disease of the carotid  bifurcation bilaterally. No significant carotid or vertebral artery stenosis in the neck. No significant intracranial stenosis. High-grade stenosis left innominate vein with filling of multiple collateral veins in the neck Electronically Signed   By: Franchot Gallo M.D.   On: 12/19/2015 16:22   Mr Brain Wo Contrast  12/19/2015  CLINICAL DATA:  Right-sided weakness EXAM: MRI HEAD WITHOUT CONTRAST TECHNIQUE: Multiplanar, multiecho pulse sequences of the brain and surrounding structures were obtained without intravenous contrast. COMPARISON:  Head CT from yesterday FINDINGS: Calvarium and upper cervical spine: C3-4, C4-5, and C5-6 disc degeneration with spurring. No focal or aggressive marrow finding. Orbits: Left cataract resection.  No acute finding. Sinuses and Mastoids: No fluid levels. Mucous retention cyst on the floor of the left maxillary sinus. Brain: No acute or remote infarct, hemorrhage, hydrocephalus, or mass lesion. No evidence of large vessel occlusion. Normal cerebral volume. Mild for age chronic microvascular disease with scattered gliosis in the cerebral Pemberton matter. IMPRESSION: Unremarkable brain MRI for age. Electronically Signed   By: Monte Fantasia M.D.   On: 12/19/2015 10:44   US Carotid Bilateral  12/19/2015  CLINICAL DATA:  Right-sided weakness. EXAM: BILATERAL CAROTID DUPLEX ULTRASOUND TECHNIQUE: Pearline Cables scale imaging, color Doppler and duplex ultrasound were performed of bilateral carotid and vertebral arteries in the neck. COMPARISON:  None. FINDINGS: Criteria: Quantification of carotid stenosis is based on velocity parameters that correlate the residual internal carotid diameter with NASCET-based stenosis levels, using the diameter of the distal internal carotid lumen as the denominator for stenosis measurement. The following velocity measurements were obtained: RIGHT ICA:  59 cm/sec CCA:  68 cm/sec SYSTOLIC ICA/CCA RATIO:  0.9 DIASTOLIC ICA/CCA  RATIO:  0.9 ECA:  69 cm/sec LEFT ICA:  67 cm/sec CCA:  53 cm/sec SYSTOLIC ICA/CCA RATIO:  1.3 DIASTOLIC ICA/CCA RATIO:  1.2 ECA:  55 cm/sec RIGHT CAROTID ARTERY: Small amount of plaque at the right carotid bulb. Small amount of echogenic plaque in the proximal internal carotid artery. Normal waveforms and velocities in the right internal carotid artery. RIGHT VERTEBRAL ARTERY: Antegrade flow and normal waveform in the right vertebral artery. LEFT CAROTID ARTERY: Small amount of echogenic plaque at the left carotid bulb. Small amount of echogenic plaque in the proximal internal carotid artery. Normal waveforms and velocities in left internal carotid artery. LEFT VERTEBRAL ARTERY: Antegrade flow and normal waveform in the left vertebral artery. 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 No significant carotid artery stenosis. Estimated degree of stenosis in the internal carotid arteries is less than 50% bilaterally. Patent vertebral arteries. Electronically Signed   By: Markus Daft M.D.   On: 12/19/2015 09:35   Ct Head Code Stroke W/o Cm  12/18/2015  CLINICAL DATA:  Dizziness.  Loss use of hands.  Weakness. EXAM: CT HEAD WITHOUT CONTRAST TECHNIQUE: Contiguous axial images were obtained from the base of the skull through the vertex without intravenous contrast. COMPARISON:  None. FINDINGS: No acute intracranial abnormality. Specifically, no hemorrhage, hydrocephalus, mass lesion, acute infarction, or significant intracranial injury. No acute calvarial abnormality. Visualized paranasal sinuses and mastoids clear. Orbital soft tissues unremarkable. IMPRESSION: No acute intracranial abnormality. Electronically Signed   By: Rolm Baptise M.D.   On: 12/18/2015 16:28    Medications:  I have reviewed the patient's current medications. Scheduled: . aspirin  325 mg Oral Daily  . enoxaparin (LOVENOX) injection  40 mg Subcutaneous Q24H  . ezetimibe  10 mg Oral Daily  . ipratropium  1 spray Each  Nare BID  . pantoprazole  40 mg Oral Daily  . sodium chloride flush  3 mL Intravenous Q12H    Assessment/Plan: Patient with one recurrent episode-short lived.  Started on ASA.  EEG unremarkable. ESR and CRP are normal.  CTA shows no evidence of posterior circulation dissection or significant stenosis.  TIA remains on the differential as well as hypertensive effects since patient with elevated BP during all of these events.    Recommendations: 1.  Agree with BP control 2.  Continue ASA daily 3.  Patient to follow up with neurology on an outpatient basis.       Alexis Goodell, MD Neurology [phone] 12/20/2015  2:55 PM

## 2015-12-20 NOTE — Progress Notes (Signed)
Patient became slightly light headed and left eye vision fuzzy to the side momentarily, BP elevated 163/94 MD notified, and assessed patient, one time dose of metoprolol 25 mg given

## 2015-12-20 NOTE — Progress Notes (Signed)
Received MD order to discharge patient to home, reviewed home meds, prescriptions, discharge instructions and exit care with patient and patient verbalized understanding, discharged to home nursing staff in wheelchair with duaghter in law

## 2016-01-09 LAB — ACETYLCHOLINE RECEPTOR AB, ALL
Acety choline binding ab: <0.03 nmol/L (ref 0.00–0.24)
Acetylchol Block Ab: 12 % (ref 0–25)

## 2016-02-18 ENCOUNTER — Other Ambulatory Visit: Payer: Self-pay

## 2016-07-24 ENCOUNTER — Other Ambulatory Visit
Admission: RE | Admit: 2016-07-24 | Discharge: 2016-07-24 | Disposition: A | Discharge status: Home / Self Care | Payer: Medicare Other | Source: Ambulatory Visit | Attending: Internal Medicine | Admitting: Internal Medicine

## 2016-07-24 DIAGNOSIS — R0789 Other chest pain: Secondary | ICD-10-CM | POA: Diagnosis present

## 2016-07-24 LAB — TROPONIN I

## 2016-09-01 ENCOUNTER — Other Ambulatory Visit: Payer: Self-pay | Admitting: Internal Medicine

## 2016-09-01 DIAGNOSIS — Z1231 Encounter for screening mammogram for malignant neoplasm of breast: Secondary | ICD-10-CM

## 2016-09-30 ENCOUNTER — Ambulatory Visit
Admission: RE | Admit: 2016-09-30 | Discharge: 2016-09-30 | Disposition: A | Discharge status: Home / Self Care | Payer: Medicare Other | Source: Ambulatory Visit | Attending: Internal Medicine | Admitting: Internal Medicine

## 2016-09-30 DIAGNOSIS — Z1231 Encounter for screening mammogram for malignant neoplasm of breast: Secondary | ICD-10-CM | POA: Insufficient documentation

## 2016-12-10 ENCOUNTER — Other Ambulatory Visit: Payer: Self-pay | Admitting: Internal Medicine

## 2016-12-10 DIAGNOSIS — M5116 Intervertebral disc disorders with radiculopathy, lumbar region: Secondary | ICD-10-CM

## 2016-12-10 DIAGNOSIS — E559 Vitamin D deficiency, unspecified: Secondary | ICD-10-CM | POA: Insufficient documentation

## 2016-12-15 ENCOUNTER — Ambulatory Visit
Admission: RE | Admit: 2016-12-15 | Discharge: 2016-12-15 | Disposition: A | Discharge status: Home / Self Care | Payer: Medicare Other | Source: Ambulatory Visit | Attending: Internal Medicine | Admitting: Internal Medicine

## 2016-12-15 DIAGNOSIS — M5116 Intervertebral disc disorders with radiculopathy, lumbar region: Secondary | ICD-10-CM | POA: Insufficient documentation

## 2016-12-15 DIAGNOSIS — M5126 Other intervertebral disc displacement, lumbar region: Secondary | ICD-10-CM | POA: Diagnosis not present

## 2016-12-15 DIAGNOSIS — M48061 Spinal stenosis, lumbar region without neurogenic claudication: Secondary | ICD-10-CM | POA: Insufficient documentation

## 2016-12-15 DIAGNOSIS — M5137 Other intervertebral disc degeneration, lumbosacral region: Secondary | ICD-10-CM | POA: Insufficient documentation

## 2016-12-15 DIAGNOSIS — M2578 Osteophyte, vertebrae: Secondary | ICD-10-CM | POA: Insufficient documentation

## 2017-01-10 IMAGING — CT CT ANGIO NECK
2 of 8 series · 8 of 33 positions shown · IV contrast (isovue)
Comparison: MRI 12/19/2015

CLINICAL DATA: TIA.  Slurred speech and confusion.

EXAM:
CT ANGIOGRAPHY HEAD AND NECK
TECHNIQUE: Multidetector CT imaging of the head and neck was performed using
the standard protocol during bolus administration of intravenous
contrast. Multiplanar CT image reconstructions and MIPs were
obtained to evaluate the vascular anatomy. Carotid stenosis
measurements (when applicable) are obtained utilizing NASCET
criteria, using the distal internal carotid diameter as the
denominator.
CONTRAST:  75 mL Isovue 370 IV

[Series 4: cta head · axial · 0.42mm/px · z∈[-256,-52]mm · 4 of 340 slices shown]
[im 68/340  soft-tissue]
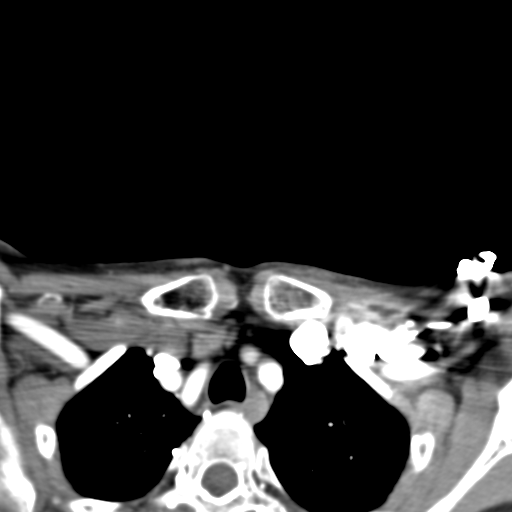
[im 136/340  bone]
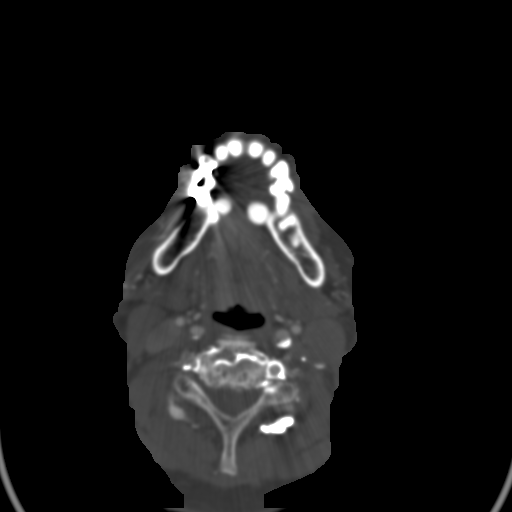
[im 204/340  soft-tissue]
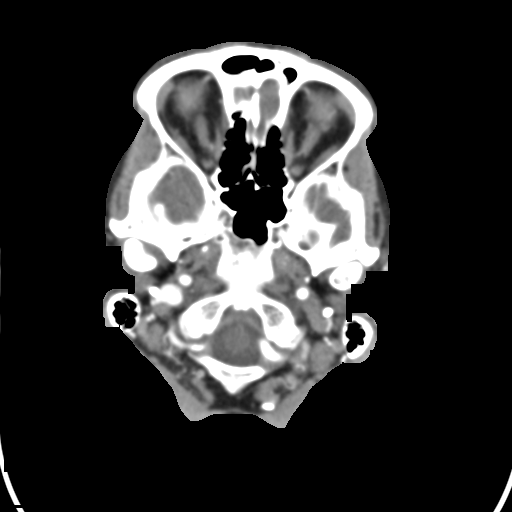
[im 272/340  bone]
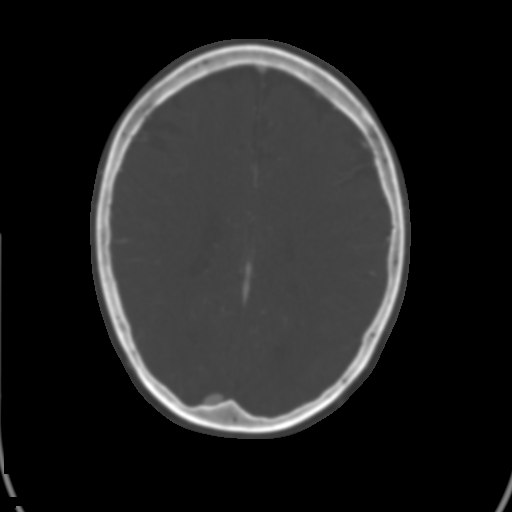

[Series 5: ax thin · axial · 0.33mm/px · z∈[-278,-81]mm · 4 of 340 slices shown]
[im 68/340  soft-tissue]
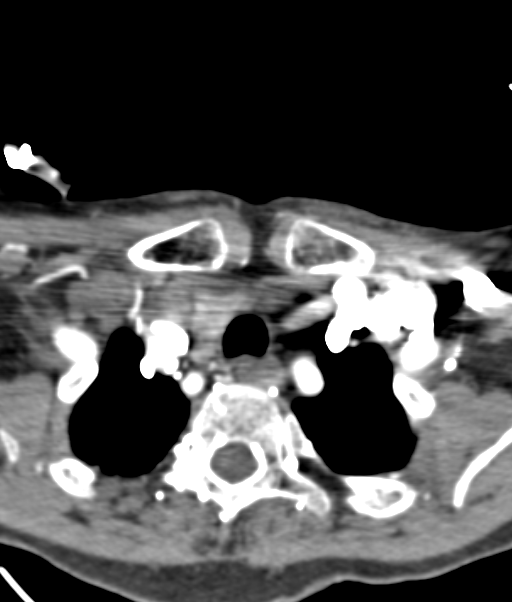
[im 136/340  soft-tissue]
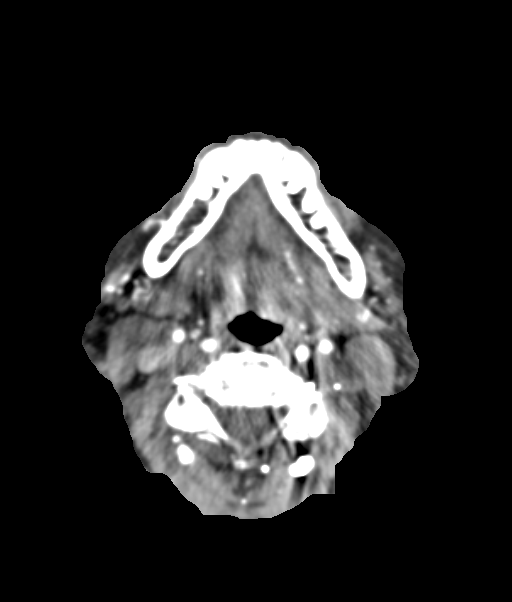
[im 204/340  soft-tissue]
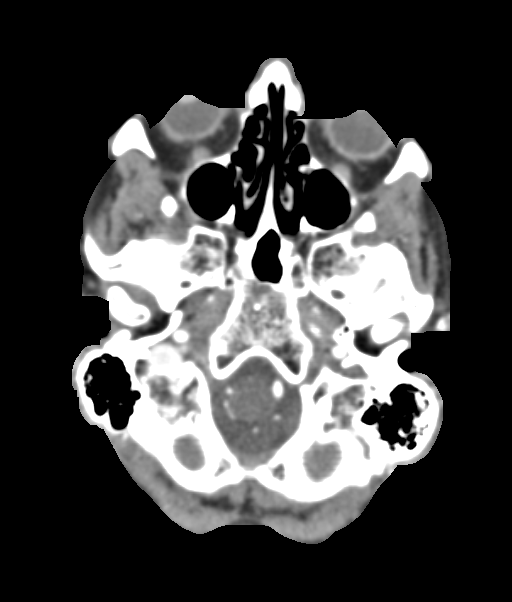
[im 272/340  soft-tissue]
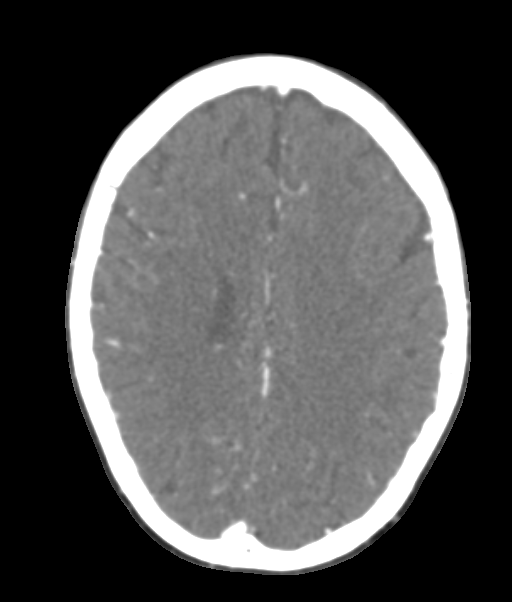

[8 of 33 positions shown; findings below may reference images not displayed]

FINDINGS: CTA NECK

Aortic arch: Mild atherosclerotic calcification aortic arch. No
aneurysm or dissection. Proximal great vessels widely patent.

Lung apices clear bilaterally.

Right carotid system: Right common carotid artery widely patent.
Mild atherosclerotic calcification of the carotid bifurcation
without significant stenosis.

Left carotid system: Left common carotid artery widely patent.
Atherosclerotic calcification of the carotid bulb on the left
without significant stenosis.

Vertebral arteries:Both vertebral arteries patent to the basilar
without significant stenosis.

Skeleton: Mild cervical spine degenerative change. No fracture or
skeletal lesion.

Other neck: Left arm contrast injection. Filling of multiple
collateral veins in the neck. There appears to be a significant
stenosis of the left innominate vein.

CTA HEAD

Anterior circulation: Mild atherosclerotic calcification in the
cavernous carotid bilaterally without stenosis. Anterior and middle
cerebral arteries patent bilaterally without stenosis. Fetal origin
left posterior cerebral artery.

Posterior circulation: Left vertebral dominant. Both vertebral
arteries contribute to the basilar. PICA patent bilaterally. Basilar
widely patent. AICA patent bilaterally. Hypoplastic left P1 segment
secondary to fetal origin left posterior cerebral artery. Superior
cerebellar and posterior cerebral arteries patent bilaterally.

Venous sinuses: Patent

Anatomic variants: Negative for cerebral vascular malformation

Delayed phase: Normal enhancement on delayed imaging.
IMPRESSION: Mild atherosclerotic disease of the carotid bifurcation bilaterally.
No significant carotid or vertebral artery stenosis in the neck.

No significant intracranial stenosis.

High-grade stenosis left innominate vein with filling of multiple
collateral veins in the neck

## 2017-03-17 ENCOUNTER — Ambulatory Visit (INDEPENDENT_AMBULATORY_CARE_PROVIDER_SITE_OTHER): Payer: Medicare Other | Admitting: Podiatry

## 2017-03-17 ENCOUNTER — Encounter: Payer: Self-pay | Admitting: Podiatry

## 2017-03-17 VITALS — BP 126/73 | HR 61 | Resp 16

## 2017-03-17 DIAGNOSIS — L603 Nail dystrophy: Secondary | ICD-10-CM

## 2017-03-17 NOTE — Progress Notes (Signed)
Subjective:  Patient ID: Karen Parsons, female    DOB: 1940/10/28,  MRN: [id number] HPI Chief Complaint  Patient presents with  . Nail Problem    Toenails bilateral hallux - Nembhard discoloration x months, wears nail polish, also concerned about corn on 3rd toe right and callused area plantar heel right      76 y.o. female presents with the above complaint.   Past Medical History:  Diagnosis Date  . Barrett esophagus   . Barrett's esophagus July 2010   Wohl,   . Benign neoplasm of colon   . Bilateral renal cysts   . Colon polyps   . Cramp of limb   . Generalized osteoarthritis   . Genital prolapse   . GERD (gastroesophageal reflux disease)   . Hallux valgus   . Hemorrhoids   . History of hiatal hernia   . Hyperlipidemia    intolerant of statins, normal diagnostic cath July 2010 Nehemiah Massed)  . Hyperlipidemia   . 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 Left ovarian cyst   . Osteoporosis    by DEXA 2008  . S/P colonoscopy 2011   [name], polyp removed   Past Surgical History:  Procedure Laterality Date  . ABDOMINAL HYSTERECTOMY     age 20, menorrhagia  . APPENDECTOMY    . arthroscopy of knee Right   . CARDIAC CATHETERIZATION Left July 2010   [name]:  normal  . CATARACT EXTRACTION Left 2010   [name]  . COLONOSCOPY WITH PROPOFOL N/A 01/07/2015   Procedure: COLONOSCOPY WITH PROPOFOL;  Surgeon: Manya Silvas, MD;  Location: [facility] ENDOSCOPY;  Service: Endoscopy;  Laterality: N/A;  . ESOPHAGOGASTRODUODENOSCOPY N/A 01/07/2015   Procedure: ESOPHAGOGASTRODUODENOSCOPY (EGD);  Surgeon: Manya Silvas, MD;  Location: [facility] ENDOSCOPY;  Service: Endoscopy;  Laterality: N/A;  . ESOPHAGOGASTRODUODENOSCOPY (EGD) WITH PROPOFOL N/A 05/23/2015   Procedure: ESOPHAGOGASTRODUODENOSCOPY (EGD) WITH PROPOFOL;  Surgeon: Manya Silvas, MD;  Location: [facility] ENDOSCOPY;  Service: Endoscopy;  Laterality: N/A;  . ESOPHAGOGASTRODUODENOSCOPY (EGD) WITH PROPOFOL N/A 07/11/2015   Procedure:  ESOPHAGOGASTRODUODENOSCOPY (EGD) WITH PROPOFOL with Barrx;  Surgeon: [name], MD;  Location: [facility] ENDOSCOPY;  Service: Gastroenterology;  Laterality: N/A;  . ESOPHAGOGASTRODUODENOSCOPY (EGD) WITH PROPOFOL N/A 09/12/2015   Procedure: ESOPHAGOGASTRODUODENOSCOPY (EGD) WITH PROPOFOL;  Surgeon: [name], MD;  Location: [facility] ENDOSCOPY;  Service: Gastroenterology;  Laterality: N/A;  . ESOPHAGOGASTRODUODENOSCOPY (EGD) WITH PROPOFOL N/A 11/14/2015   Procedure: ESOPHAGOGASTRODUODENOSCOPY (EGD) WITH PROPOFOL;  Surgeon: [name], MD;  Location: [facility] ENDOSCOPY;  Service: Gastroenterology;  Laterality: N/A;  . HEMORRHOID SURGERY  10/02/2013   by simple ligation  . medial meniscotomy Left 2007   [name]  . OSTEOTOMY TOES Right 2008   [name]  . TONSILLECTOMY      Current Outpatient Prescriptions:  .  aspirin EC 81 MG tablet, Take 81 mg by mouth., Disp: , Rfl:  .  docusate sodium (DOK) 100 MG capsule, TK 1 C PO BID FOR 10 DAYS, Disp: , Rfl:  .  traMADol (ULTRAM) 50 MG tablet, 1 po bid prn, Disp: , Rfl:  .  acetaminophen (TYLENOL) 650 MG CR tablet, Take 1,300 mg by mouth every 8 (eight) hours as needed for pain. , Disp: , Rfl:  .  esomeprazole (NEXIUM) 20 MG capsule, Take 20 mg by mouth daily. , Disp: , Rfl:  .  ipratropium (ATROVENT) 0.03 % nasal spray, Place 2 sprays into both nostrils 2 (two) times daily as needed for rhinitis., Disp: , Rfl:   Allergies  Allergen  Reactions  . Ezetimibe     Other reaction(s): Muscle Pain  . Lipitor [Atorvastatin] Other (See Comments)    Reaction:  Muscle pain   . Sulfamethoxazole-Trimethoprim Nausea And Vomiting, Other (See Comments) and Nausea Only    "Shaking all over" "Shaking all over" Reaction:  Shaking   . Welchol [Colesevelam] Other (See Comments)    Reaction:  Muscle pain    Review of Systems  Musculoskeletal: Positive for arthralgias and back pain.  All other systems reviewed and are negative.  Objective:   Vitals:   03/17/17 0922  BP:  126/73  Pulse: 61  Resp: 16    General: Well developed, nourished, in no acute distress, alert and oriented x3   Dermatological: Skin is warm, dry and supple bilateral. Nails x 10 are well maintained; remaining integument appears unremarkable at this time. There are no open sores, no preulcerative lesions, no rash or signs of infection present. Toenails hallux bilaterally slightly elongated appears to have a superficial Czaja onychomycosis at first glance. However I do believe this is more than likely a nail binder due to her pain and toenails. Several other toenails have this.  Vascular: Dorsalis Pedis artery and Posterior Tibial artery pedal pulses are 2/4 bilateral with immedate capillary fill time. Pedal hair growth present. No varicosities and no lower extremity edema present bilateral.   Neruologic: Grossly intact via light touch bilateral. Vibratory intact via tuning fork bilateral. Protective threshold with Semmes Wienstein monofilament intact to all pedal sites bilateral. Patellar and Achilles deep tendon reflexes 2+ bilateral. No Babinski or clonus noted bilateral.   Musculoskeletal: No gross boney pedal deformities bilateral. No pain, crepitus, or limitation noted with foot and ankle range of motion bilateral. Muscular strength 5/5 in all groups tested bilateral.  Gait: Unassisted, Nonantalgic.    Radiographs:  None taken  Assessment & Plan:   Assessment: Nail dystrophy  Plan: Debridement     Parish Augustine T. [place], [place]

## 2017-04-28 ENCOUNTER — Other Ambulatory Visit: Payer: Self-pay

## 2017-04-28 ENCOUNTER — Encounter
Admission: RE | Admit: 2017-04-28 | Discharge: 2017-04-28 | Disposition: A | Discharge status: Home / Self Care | Payer: Medicare Other | Source: Ambulatory Visit | Attending: Orthopedic Surgery | Admitting: Orthopedic Surgery

## 2017-04-28 DIAGNOSIS — Z8673 Personal history of transient ischemic attack (TIA), and cerebral infarction without residual deficits: Secondary | ICD-10-CM | POA: Diagnosis not present

## 2017-04-28 DIAGNOSIS — Z01812 Encounter for preprocedural laboratory examination: Secondary | ICD-10-CM | POA: Insufficient documentation

## 2017-04-28 DIAGNOSIS — Z0183 Encounter for blood typing: Secondary | ICD-10-CM | POA: Diagnosis not present

## 2017-04-28 DIAGNOSIS — Z01818 Encounter for other preprocedural examination: Secondary | ICD-10-CM | POA: Diagnosis present

## 2017-04-28 HISTORY — DX: Diverticulosis of intestine, part unspecified, without perforation or abscess without bleeding: K57.90

## 2017-04-28 HISTORY — DX: Cerebral infarction, unspecified: I63.9

## 2017-04-28 HISTORY — DX: Allergy status to unspecified drugs, medicaments and biological substances: Z88.9

## 2017-04-28 LAB — URINALYSIS, ROUTINE W REFLEX MICROSCOPIC
BILIRUBIN URINE: NEGATIVE
Glucose, UA: NEGATIVE mg/dL
Hgb urine dipstick: NEGATIVE
Ketones, ur: NEGATIVE mg/dL
LEUKOCYTES UA: NEGATIVE
NITRITE: NEGATIVE
PH: 6 (ref 5.0–8.0)
Protein, ur: NEGATIVE mg/dL
SPECIFIC GRAVITY, URINE: 1.019 (ref 1.005–1.030)

## 2017-04-28 LAB — COMPREHENSIVE METABOLIC PANEL
ALBUMIN: 4.2 g/dL (ref 3.5–5.0)
ALT: 14 U/L (ref 14–54)
AST: 19 U/L (ref 15–41)
Alkaline Phosphatase: 43 U/L (ref 38–126)
Anion gap: 8 (ref 5–15)
BILIRUBIN TOTAL: 1.1 mg/dL (ref 0.3–1.2)
BUN: 30 mg/dL — AB (ref 6–20)
CO2: 28 mmol/L (ref 22–32)
Calcium: 9.1 mg/dL (ref 8.9–10.3)
Chloride: 100 mmol/L — ABNORMAL LOW (ref 101–111)
Creatinine, Ser: 0.82 mg/dL (ref 0.44–1.00)
Glucose, Bld: 96 mg/dL (ref 65–99)
POTASSIUM: 4.1 mmol/L (ref 3.5–5.1)
SODIUM: 136 mmol/L (ref 135–145)
TOTAL PROTEIN: 7.1 g/dL (ref 6.5–8.1)

## 2017-04-28 LAB — C-REACTIVE PROTEIN: CRP: <0.8 mg/dL (ref <1.0)

## 2017-04-28 LAB — CBC
HCT: 41.4 % (ref 35.0–47.0)
HEMOGLOBIN: 13.4 g/dL (ref 12.0–16.0)
MCH: 31 pg (ref 26.0–34.0)
MCHC: 32.3 g/dL (ref 32.0–36.0)
MCV: 95.9 fL (ref 80.0–100.0)
PLATELETS: 249 10*3/uL (ref 150–440)
RBC: 4.32 MIL/uL (ref 3.80–5.20)
RDW: 14.2 % (ref 11.5–14.5)
WBC: 4.3 10*3/uL (ref 3.6–11.0)

## 2017-04-28 LAB — TYPE AND SCREEN
ABO/RH(D): O POS
ANTIBODY SCREEN: NEGATIVE

## 2017-04-28 LAB — PROTIME-INR
INR: 0.99
PROTHROMBIN TIME: 13 s (ref 11.4–15.2)

## 2017-04-28 LAB — SEDIMENTATION RATE: Sed Rate: 4 mm/hr (ref 0–30)

## 2017-04-28 LAB — APTT: APTT: 30 s (ref 24–36)

## 2017-04-28 LAB — SURGICAL PCR SCREEN
MRSA, PCR: NEGATIVE
STAPHYLOCOCCUS AUREUS: NEGATIVE

## 2017-04-28 NOTE — Patient Instructions (Signed)
Your procedure is scheduled on May 10, 2017 [facility] ) Report to Same  Day Surgery. [facility] ) SECOND FLOOR To find out your arrival time please call [phone] between 1PM - 3PM on May 07, 2017 (FRIDAY ).  Remember: Instructions that are not followed completely may result in serious medical risk, up to and including death, or upon the discretion of your surgeon and anesthesiologist your surgery may need to be rescheduled.     _X__ 1. Do not eat food after midnight the night before your procedure.                 No gum chewing or hard candies. You may drink clear liquids up to 2 hours                 before you are scheduled to arrive for your surgery- DO not drink clear                 liquids within 2 hours of the start of your surgery.                 Clear Liquids include:  water, apple juice without pulp, clear carbohydrate                 drink such as Clearfast of Gartorade, Black Coffee or Tea (Do not add                 anything to coffee or tea).     _X__ 2.  No Alcohol for 24 hours before or after surgery.   _X__ 3.  Do Not Smoke or use e-cigarettes For 24 Hours Prior to Your Surgery.                 Do not use any chewable tobacco products for at least 6 hours prior to                 surgery.  ____  4.  Bring all medications with you on the day of surgery if instructed.   _X___  5.  Notify your doctor if there is any change in your medical condition      (cold, fever, infections).     Do not wear jewelry, make-up, hairpins, clips or nail polish. Do not wear lotions, powders, or perfumes.  Do not shave 48 hours prior to surgery. Men may shave face and neck. Do not bring valuables to the hospital.    [facility] is not responsible for any belongings or valuables.  Contacts, dentures or bridgework may not be worn into surgery. Leave your suitcase in the car. After surgery it may be brought to your room. For patients admitted to the  hospital, discharge time is determined by your treatment team.   Patients discharged the day of surgery will not be allowed to drive home.   Please read over the following fact sheets that you were given:   MRSA Information          ____ Take these medicines the morning of surgery with A SIP OF WATER:    1. OMEPRAZOLE  2.   3.   4.  5.  6.  ____ Fleet Enema (as directed)   __X__ Use CHG Soap as directed  ____ Use inhalers on the day of surgery  ____ Stop metformin 2 days prior to surgery    ____ Take 1/2 of usual insulin dose the night before surgery. No insulin the morning  of surgery.   _X___ Stop Coumadin/Plavix/aspirin on (STOP ASPIRIN ONE WEEK PRIOR TO SURGERY )  _X___ Stop Anti-inflammatories on (STOP DICLOFENAC GEL ONE WEEK PRIOR TO SURGERY )STOP ALL ASPIRIN PRODUCTS ONE WEEK PRIOR TO SURGERY  ) OK TO TAKE TYLENOL OR TRAMADOL IF NEEDED   ____ Stop supplements until after surgery.    ____ Bring C-Pap to the hospital.

## 2017-04-29 LAB — URINE CULTURE
CULTURE: NO GROWTH
SPECIAL REQUESTS: NORMAL

## 2017-05-09 MED ORDER — TRANEXAMIC ACID 1000 MG/10ML IV SOLN
1000.0000 mg | INTRAVENOUS | Status: DC
  Filled 2017-05-09: qty 10

## 2017-05-09 MED ORDER — CEFAZOLIN SODIUM-DEXTROSE 2-4 GM/100ML-% IV SOLN: 2.0000 g | INTRAVENOUS | Status: DC

## 2017-05-10 ENCOUNTER — Encounter: Payer: Self-pay | Admitting: Orthopedic Surgery

## 2017-05-10 ENCOUNTER — Inpatient Hospital Stay: Payer: Medicare Other | Admitting: Certified Registered Nurse Anesthetist

## 2017-05-10 ENCOUNTER — Inpatient Hospital Stay
Admission: RE | Admit: 2017-05-10 | Discharge: 2017-05-12 | DRG: 470 | Disposition: A | Discharge status: Home Health | Payer: Medicare Other | Source: Ambulatory Visit | Attending: Orthopedic Surgery | Admitting: Orthopedic Surgery

## 2017-05-10 ENCOUNTER — Other Ambulatory Visit: Payer: Self-pay

## 2017-05-10 ENCOUNTER — Inpatient Hospital Stay: Payer: Medicare Other

## 2017-05-10 ENCOUNTER — Encounter
Admission: RE | Disposition: A | Discharge status: Home Health | Payer: Self-pay | Source: Ambulatory Visit | Attending: Orthopedic Surgery

## 2017-05-10 DIAGNOSIS — K219 Gastro-esophageal reflux disease without esophagitis: Secondary | ICD-10-CM | POA: Diagnosis present

## 2017-05-10 DIAGNOSIS — Z888 Allergy status to other drugs, medicaments and biological substances status: Secondary | ICD-10-CM

## 2017-05-10 DIAGNOSIS — Z8673 Personal history of transient ischemic attack (TIA), and cerebral infarction without residual deficits: Secondary | ICD-10-CM

## 2017-05-10 DIAGNOSIS — E785 Hyperlipidemia, unspecified: Secondary | ICD-10-CM | POA: Diagnosis present

## 2017-05-10 DIAGNOSIS — J302 Other seasonal allergic rhinitis: Secondary | ICD-10-CM | POA: Diagnosis present

## 2017-05-10 DIAGNOSIS — K2270 Barrett's esophagus without dysplasia: Secondary | ICD-10-CM | POA: Diagnosis present

## 2017-05-10 DIAGNOSIS — M25561 Pain in right knee: Secondary | ICD-10-CM | POA: Diagnosis present

## 2017-05-10 DIAGNOSIS — M1711 Unilateral primary osteoarthritis, right knee: Secondary | ICD-10-CM | POA: Diagnosis present

## 2017-05-10 DIAGNOSIS — Z7982 Long term (current) use of aspirin: Secondary | ICD-10-CM | POA: Diagnosis not present

## 2017-05-10 DIAGNOSIS — Z86010 Personal history of colonic polyps: Secondary | ICD-10-CM

## 2017-05-10 DIAGNOSIS — Z79899 Other long term (current) drug therapy: Secondary | ICD-10-CM

## 2017-05-10 DIAGNOSIS — M810 Age-related osteoporosis without current pathological fracture: Secondary | ICD-10-CM | POA: Diagnosis present

## 2017-05-10 DIAGNOSIS — Z96659 Presence of unspecified artificial knee joint: Secondary | ICD-10-CM

## 2017-05-10 HISTORY — PX: KNEE ARTHROPLASTY: SHX992

## 2017-05-10 LAB — ABO/RH: ABO/RH(D): O POS

## 2017-05-10 SURGERY — ARTHROPLASTY, KNEE, TOTAL, USING IMAGELESS COMPUTER-ASSISTED NAVIGATION
Anesthesia: Spinal | Site: Knee | Laterality: Right | Wound class: Clean

## 2017-05-10 MED ORDER — ALUM & MAG HYDROXIDE-SIMETH 200-200-20 MG/5ML PO SUSP: 30.0000 mL | ORAL | Status: DC | PRN

## 2017-05-10 MED ORDER — PHENOL 1.4 % MT LIQD
1.0000 | OROMUCOSAL | Status: DC | PRN
  Filled 2017-05-10: qty 177

## 2017-05-10 MED ORDER — BUPIVACAINE HCL (PF) 0.25 % IJ SOLN
INTRAMUSCULAR | Status: DC | PRN
  Administered 2017-05-10: 60 mL

## 2017-05-10 MED ORDER — FERROUS SULFATE 325 (65 FE) MG PO TABS
325.0000 mg | ORAL_TABLET | Freq: Two times a day (BID) | ORAL | Status: DC
  Administered 2017-05-11 – 2017-05-12 (×3): 325 mg via ORAL
  Filled 2017-05-10 (×3): qty 1

## 2017-05-10 MED ORDER — BUPIVACAINE HCL (PF) 0.5 % IJ SOLN
INTRAMUSCULAR | Status: DC | PRN
  Administered 2017-05-10: 2.5 mL

## 2017-05-10 MED ORDER — ONDANSETRON HCL 4 MG PO TABS: 4.0000 mg | ORAL_TABLET | Freq: Four times a day (QID) | ORAL | Status: DC | PRN

## 2017-05-10 MED ORDER — PROMETHAZINE HCL 25 MG/ML IJ SOLN: 6.2500 mg | INTRAMUSCULAR | Status: DC | PRN

## 2017-05-10 MED ORDER — CELECOXIB 200 MG PO CAPS
200.0000 mg | ORAL_CAPSULE | Freq: Two times a day (BID) | ORAL | Status: DC
  Administered 2017-05-10 – 2017-05-12 (×4): 200 mg via ORAL
  Filled 2017-05-10 (×4): qty 1

## 2017-05-10 MED ORDER — FLEET ENEMA 7-19 GM/118ML RE ENEM: 1.0000 | ENEMA | Freq: Once | RECTAL | Status: DC | PRN

## 2017-05-10 MED ORDER — OXYCODONE HCL 5 MG PO TABS
5.0000 mg | ORAL_TABLET | ORAL | Status: DC | PRN
  Administered 2017-05-10 – 2017-05-11 (×3): 5 mg via ORAL
  Filled 2017-05-10 (×3): qty 1

## 2017-05-10 MED ORDER — ACETAMINOPHEN 650 MG RE SUPP: 650.0000 mg | RECTAL | Status: DC | PRN

## 2017-05-10 MED ORDER — MENTHOL 3 MG MT LOZG
1.0000 | LOZENGE | OROMUCOSAL | Status: DC | PRN
  Filled 2017-05-10: qty 9

## 2017-05-10 MED ORDER — FENTANYL CITRATE (PF) 100 MCG/2ML IJ SOLN
25.0000 ug | INTRAMUSCULAR | Status: DC | PRN
  Administered 2017-05-10: 25 ug via INTRAVENOUS

## 2017-05-10 MED ORDER — ACETAMINOPHEN 10 MG/ML IV SOLN
INTRAVENOUS | Status: AC
  Filled 2017-05-10: qty 100

## 2017-05-10 MED ORDER — ACETAMINOPHEN 10 MG/ML IV SOLN
1000.0000 mg | Freq: Four times a day (QID) | INTRAVENOUS | Status: AC
  Administered 2017-05-10 – 2017-05-11 (×4): 1000 mg via INTRAVENOUS
  Filled 2017-05-10 (×4): qty 100

## 2017-05-10 MED ORDER — FENTANYL CITRATE (PF) 100 MCG/2ML IJ SOLN
INTRAMUSCULAR | Status: AC
  Filled 2017-05-10: qty 2

## 2017-05-10 MED ORDER — LIDOCAINE HCL (CARDIAC) 20 MG/ML IV SOLN
INTRAVENOUS | Status: DC | PRN
  Administered 2017-05-10: 100 mg via INTRAVENOUS

## 2017-05-10 MED ORDER — DEXTROSE 5 % IV SOLN
2.0000 g | Freq: Four times a day (QID) | INTRAVENOUS | Status: AC
  Administered 2017-05-10 – 2017-05-11 (×4): 2 g via INTRAVENOUS
  Filled 2017-05-10 (×6): qty 2000

## 2017-05-10 MED ORDER — CEFAZOLIN SODIUM-DEXTROSE 2-4 GM/100ML-% IV SOLN: 2.0000 g | Freq: Four times a day (QID) | INTRAVENOUS | Status: DC

## 2017-05-10 MED ORDER — OXYCODONE HCL 5 MG/5ML PO SOLN: 5.0000 mg | Freq: Once | ORAL | Status: DC | PRN

## 2017-05-10 MED ORDER — IPRATROPIUM BROMIDE 0.03 % NA SOLN
1.0000 | Freq: Every day | NASAL | Status: DC | PRN
  Filled 2017-05-10: qty 30

## 2017-05-10 MED ORDER — SODIUM CHLORIDE 0.9 % IV SOLN
INTRAVENOUS | Status: DC | PRN
  Administered 2017-05-10: 20 ug/min via INTRAVENOUS

## 2017-05-10 MED ORDER — SODIUM CHLORIDE 0.9 % IJ SOLN
INTRAMUSCULAR | Status: AC
  Filled 2017-05-10: qty 50

## 2017-05-10 MED ORDER — EPHEDRINE SULFATE 50 MG/ML IJ SOLN
INTRAMUSCULAR | Status: AC
  Filled 2017-05-10: qty 1

## 2017-05-10 MED ORDER — ENOXAPARIN SODIUM 30 MG/0.3ML ~~LOC~~ SOLN
30.0000 mg | Freq: Two times a day (BID) | SUBCUTANEOUS | Status: DC
  Administered 2017-05-11 – 2017-05-12 (×3): 30 mg via SUBCUTANEOUS
  Filled 2017-05-10 (×3): qty 0.3

## 2017-05-10 MED ORDER — NEOMYCIN-POLYMYXIN B GU 40-200000 IR SOLN
Status: DC | PRN
  Administered 2017-05-10: 14 mL

## 2017-05-10 MED ORDER — BISACODYL 10 MG RE SUPP
10.0000 mg | Freq: Every day | RECTAL | Status: DC | PRN
  Administered 2017-05-11: 10 mg via RECTAL
  Filled 2017-05-10: qty 1

## 2017-05-10 MED ORDER — METOCLOPRAMIDE HCL 10 MG PO TABS
10.0000 mg | ORAL_TABLET | Freq: Three times a day (TID) | ORAL | Status: DC
  Administered 2017-05-10 – 2017-05-12 (×6): 10 mg via ORAL
  Filled 2017-05-10 (×6): qty 1

## 2017-05-10 MED ORDER — BUPIVACAINE LIPOSOME 1.3 % IJ SUSP
INTRAMUSCULAR | Status: AC
  Filled 2017-05-10: qty 20

## 2017-05-10 MED ORDER — CEFAZOLIN SODIUM-DEXTROSE 2-4 GM/100ML-% IV SOLN
INTRAVENOUS | Status: AC
  Filled 2017-05-10: qty 100

## 2017-05-10 MED ORDER — TRANEXAMIC ACID 1000 MG/10ML IV SOLN
1000.0000 mg | Freq: Once | INTRAVENOUS | Status: AC
  Administered 2017-05-10: 1000 mg via INTRAVENOUS
  Filled 2017-05-10: qty 10

## 2017-05-10 MED ORDER — ACETAMINOPHEN 10 MG/ML IV SOLN
INTRAVENOUS | Status: DC | PRN
  Administered 2017-05-10: 1000 mg via INTRAVENOUS

## 2017-05-10 MED ORDER — TRAMADOL HCL 50 MG PO TABS
50.0000 mg | ORAL_TABLET | ORAL | Status: DC | PRN
  Administered 2017-05-11 – 2017-05-12 (×2): 50 mg via ORAL
  Filled 2017-05-10 (×2): qty 1

## 2017-05-10 MED ORDER — BUPIVACAINE HCL (PF) 0.5 % IJ SOLN
INTRAMUSCULAR | Status: AC
  Filled 2017-05-10: qty 10

## 2017-05-10 MED ORDER — VITAMIN D 1000 UNITS PO TABS
2000.0000 [IU] | ORAL_TABLET | Freq: Every day | ORAL | Status: DC
  Administered 2017-05-11 – 2017-05-12 (×2): 2000 [IU] via ORAL
  Filled 2017-05-10 (×2): qty 2

## 2017-05-10 MED ORDER — LACTATED RINGERS IV SOLN
INTRAVENOUS | Status: DC
  Administered 2017-05-10: 25 mL/h via INTRAVENOUS
  Administered 2017-05-10: 14:00:00 via INTRAVENOUS

## 2017-05-10 MED ORDER — TETRACAINE HCL 1 % IJ SOLN
INTRAMUSCULAR | Status: DC | PRN
  Administered 2017-05-10: 5 mg via INTRASPINAL

## 2017-05-10 MED ORDER — BUPIVACAINE HCL (PF) 0.25 % IJ SOLN
INTRAMUSCULAR | Status: AC
  Filled 2017-05-10: qty 60

## 2017-05-10 MED ORDER — SENNOSIDES-DOCUSATE SODIUM 8.6-50 MG PO TABS
1.0000 | ORAL_TABLET | Freq: Two times a day (BID) | ORAL | Status: DC
  Administered 2017-05-10 – 2017-05-12 (×4): 1 via ORAL
  Filled 2017-05-10 (×4): qty 1

## 2017-05-10 MED ORDER — PHENYLEPHRINE HCL 10 MG/ML IJ SOLN
INTRAMUSCULAR | Status: AC
  Filled 2017-05-10: qty 1

## 2017-05-10 MED ORDER — PROPOFOL 10 MG/ML IV BOLUS
INTRAVENOUS | Status: DC | PRN
  Administered 2017-05-10: 40 mg via INTRAVENOUS

## 2017-05-10 MED ORDER — PROPOFOL 10 MG/ML IV BOLUS
INTRAVENOUS | Status: AC
  Filled 2017-05-10: qty 20

## 2017-05-10 MED ORDER — MIDAZOLAM HCL 5 MG/5ML IJ SOLN
INTRAMUSCULAR | Status: DC | PRN
  Administered 2017-05-10: 2 mg via INTRAVENOUS

## 2017-05-10 MED ORDER — FENTANYL CITRATE (PF) 100 MCG/2ML IJ SOLN
INTRAMUSCULAR | Status: AC
  Administered 2017-05-10: 25 ug via INTRAVENOUS
  Filled 2017-05-10: qty 2

## 2017-05-10 MED ORDER — ONDANSETRON HCL 4 MG/2ML IJ SOLN
INTRAMUSCULAR | Status: DC | PRN
  Administered 2017-05-10: 4 mg via INTRAVENOUS

## 2017-05-10 MED ORDER — MEPERIDINE HCL 50 MG/ML IJ SOLN
6.2500 mg | INTRAMUSCULAR | Status: DC | PRN
Start: 2017-05-10 — End: 2017-05-10

## 2017-05-10 MED ORDER — MORPHINE SULFATE (PF) 2 MG/ML IV SOLN: 2.0000 mg | INTRAVENOUS | Status: DC | PRN

## 2017-05-10 MED ORDER — CHLORHEXIDINE GLUCONATE 4 % EX LIQD: 60.0000 mL | Freq: Once | CUTANEOUS | Status: DC

## 2017-05-10 MED ORDER — ACETAMINOPHEN 325 MG PO TABS
650.0000 mg | ORAL_TABLET | ORAL | Status: DC | PRN
  Administered 2017-05-12: 650 mg via ORAL
  Filled 2017-05-10: qty 2

## 2017-05-10 MED ORDER — FENTANYL CITRATE (PF) 100 MCG/2ML IJ SOLN
INTRAMUSCULAR | Status: DC | PRN
  Administered 2017-05-10: 50 ug via INTRAVENOUS

## 2017-05-10 MED ORDER — ONDANSETRON HCL 4 MG/2ML IJ SOLN
4.0000 mg | Freq: Four times a day (QID) | INTRAMUSCULAR | Status: DC | PRN
  Filled 2017-05-10: qty 2

## 2017-05-10 MED ORDER — GLYCOPYRROLATE 0.2 MG/ML IJ SOLN
INTRAMUSCULAR | Status: AC
  Filled 2017-05-10: qty 1

## 2017-05-10 MED ORDER — MIDAZOLAM HCL 2 MG/2ML IJ SOLN
INTRAMUSCULAR | Status: AC
  Filled 2017-05-10: qty 2

## 2017-05-10 MED ORDER — FAMOTIDINE 20 MG PO TABS
ORAL_TABLET | ORAL | Status: AC
  Administered 2017-05-10: 20 mg
  Filled 2017-05-10: qty 1

## 2017-05-10 MED ORDER — OXYCODONE HCL 5 MG PO TABS: 10.0000 mg | ORAL_TABLET | ORAL | Status: DC | PRN

## 2017-05-10 MED ORDER — SODIUM CHLORIDE 0.9 % IV SOLN
INTRAVENOUS | Status: DC | PRN
  Administered 2017-05-10: 60 mL

## 2017-05-10 MED ORDER — MAGNESIUM HYDROXIDE 400 MG/5ML PO SUSP
30.0000 mL | Freq: Every day | ORAL | Status: DC | PRN
  Administered 2017-05-11: 30 mL via ORAL
  Filled 2017-05-10: qty 30

## 2017-05-10 MED ORDER — PANTOPRAZOLE SODIUM 40 MG PO TBEC
40.0000 mg | DELAYED_RELEASE_TABLET | Freq: Two times a day (BID) | ORAL | Status: DC
  Administered 2017-05-10 – 2017-05-12 (×4): 40 mg via ORAL
  Filled 2017-05-10 (×4): qty 1

## 2017-05-10 MED ORDER — OXYCODONE HCL 5 MG PO TABS: 5.0000 mg | ORAL_TABLET | Freq: Once | ORAL | Status: DC | PRN

## 2017-05-10 MED ORDER — GLYCOPYRROLATE 0.2 MG/ML IJ SOLN
INTRAMUSCULAR | Status: DC | PRN
  Administered 2017-05-10: 0.2 mg via INTRAVENOUS

## 2017-05-10 MED ORDER — DIPHENHYDRAMINE HCL 12.5 MG/5ML PO ELIX: 12.5000 mg | ORAL_SOLUTION | ORAL | Status: DC | PRN

## 2017-05-10 MED ORDER — PROPOFOL 500 MG/50ML IV EMUL
INTRAVENOUS | Status: DC | PRN
  Administered 2017-05-10: 80 ug/kg/min via INTRAVENOUS

## 2017-05-10 MED ORDER — SODIUM CHLORIDE 0.9 % IV SOLN
INTRAVENOUS | Status: DC
  Administered 2017-05-10: 18:00:00 via INTRAVENOUS

## 2017-05-10 MED ORDER — PROPOFOL 500 MG/50ML IV EMUL
INTRAVENOUS | Status: AC
  Filled 2017-05-10: qty 50

## 2017-05-10 MED ORDER — NEOMYCIN-POLYMYXIN B GU 40-200000 IR SOLN
Status: AC
  Filled 2017-05-10: qty 20

## 2017-05-10 MED ORDER — TRANEXAMIC ACID 1000 MG/10ML IV SOLN
INTRAVENOUS | Status: DC | PRN
  Administered 2017-05-10: 1000 mg via INTRAVENOUS

## 2017-05-10 MED ORDER — CEFAZOLIN SODIUM-DEXTROSE 2-3 GM-%(50ML) IV SOLR
INTRAVENOUS | Status: DC | PRN
  Administered 2017-05-10: 2 g via INTRAVENOUS

## 2017-05-10 SURGICAL SUPPLY — 61 items
BATTERY INSTRU NAVIGATION (MISCELLANEOUS) ×12 IMPLANT
BLADE SAW 1 (BLADE) ×3 IMPLANT
BLADE SAW 1/2 (BLADE) ×3 IMPLANT
BLADE SAW 70X12.5 (BLADE) IMPLANT
CANISTER SUCT 1200ML W/VALVE (MISCELLANEOUS) ×3 IMPLANT
CANISTER SUCT 3000ML PPV (MISCELLANEOUS) ×6 IMPLANT
CAPT KNEE TOTAL 3 ATTUNE ×3 IMPLANT
CATH TRAY METER 16FR LF (MISCELLANEOUS) ×3 IMPLANT
CEMENT HV SMART SET (Cement) ×6 IMPLANT
COOLER POLAR GLACIER W/PUMP (MISCELLANEOUS) ×3 IMPLANT
CUFF TOURN 24 STER (MISCELLANEOUS) IMPLANT
CUFF TOURN 30 STER DUAL PORT (MISCELLANEOUS) IMPLANT
DRAPE SHEET LG 3/4 BI-LAMINATE (DRAPES) ×3 IMPLANT
DRSG DERMACEA 8X12 NADH (GAUZE/BANDAGES/DRESSINGS) ×3 IMPLANT
DRSG OPSITE POSTOP 4X14 (GAUZE/BANDAGES/DRESSINGS) ×3 IMPLANT
DRSG TEGADERM 4X4.75 (GAUZE/BANDAGES/DRESSINGS) ×3 IMPLANT
DURAPREP 26ML APPLICATOR (WOUND CARE) ×6 IMPLANT
ELECT CAUTERY BLADE 6.4 (BLADE) ×3 IMPLANT
ELECT REM PT RETURN 9FT ADLT (ELECTROSURGICAL) ×3
ELECTRODE REM PT RTRN 9FT ADLT (ELECTROSURGICAL) ×1 IMPLANT
EVACUATOR 1/8 PVC DRAIN (DRAIN) ×3 IMPLANT
EX-PIN ORTHOLOCK NAV 4X150 (PIN) ×6 IMPLANT
GLOVE BIOGEL M STRL SZ7.5 (GLOVE) ×6 IMPLANT
GLOVE BIOGEL PI IND STRL 9 (GLOVE) ×1 IMPLANT
GLOVE BIOGEL PI INDICATOR 9 (GLOVE) ×2
GLOVE INDICATOR 8.0 STRL GRN (GLOVE) ×3 IMPLANT
GLOVE SURG SYN 9.0  PF PI (GLOVE) ×2
GLOVE SURG SYN 9.0 PF PI (GLOVE) ×1 IMPLANT
GOWN STRL REUS W/ TWL LRG LVL3 (GOWN DISPOSABLE) ×2 IMPLANT
GOWN STRL REUS W/TWL 2XL LVL3 (GOWN DISPOSABLE) ×3 IMPLANT
GOWN STRL REUS W/TWL LRG LVL3 (GOWN DISPOSABLE) ×4
HOLDER FOLEY CATH W/STRAP (MISCELLANEOUS) ×3 IMPLANT
HOOD PEEL AWAY FLYTE STAYCOOL (MISCELLANEOUS) ×6 IMPLANT
KIT RM TURNOVER STRD PROC AR (KITS) ×3 IMPLANT
KNIFE SCULPS 14X20 (INSTRUMENTS) ×3 IMPLANT
LABEL OR SOLS (LABEL) ×3 IMPLANT
NDL SAFETY 18GX1.5 (NEEDLE) ×3 IMPLANT
NEEDLE SPNL 20GX3.5 QUINCKE YW (NEEDLE) ×6 IMPLANT
NS IRRIG 500ML POUR BTL (IV SOLUTION) ×3 IMPLANT
PACK TOTAL KNEE (MISCELLANEOUS) ×3 IMPLANT
PAD WRAPON POLAR KNEE (MISCELLANEOUS) ×1 IMPLANT
PIN FIXATION 1/8DIA X 3INL (PIN) ×3 IMPLANT
PULSAVAC PLUS IRRIG FAN TIP (DISPOSABLE) ×3
SOL .9 NS 3000ML IRR  AL (IV SOLUTION) ×2
SOL .9 NS 3000ML IRR UROMATIC (IV SOLUTION) ×1 IMPLANT
SOL PREP PVP 2OZ (MISCELLANEOUS) ×3
SOLUTION PREP PVP 2OZ (MISCELLANEOUS) ×1 IMPLANT
SPONGE DRAIN TRACH 4X4 STRL 2S (GAUZE/BANDAGES/DRESSINGS) ×3 IMPLANT
STAPLER SKIN PROX 35W (STAPLE) ×3 IMPLANT
STRAP TIBIA SHORT (MISCELLANEOUS) ×3 IMPLANT
SUCTION FRAZIER HANDLE 10FR (MISCELLANEOUS) ×2
SUCTION TUBE FRAZIER 10FR DISP (MISCELLANEOUS) ×1 IMPLANT
SUT VIC AB 0 CT1 36 (SUTURE) ×3 IMPLANT
SUT VIC AB 1 CT1 36 (SUTURE) ×6 IMPLANT
SUT VIC AB 2-0 CT2 27 (SUTURE) ×3 IMPLANT
SYR 20CC LL (SYRINGE) ×3 IMPLANT
SYR 30ML LL (SYRINGE) ×6 IMPLANT
TIP FAN IRRIG PULSAVAC PLUS (DISPOSABLE) ×1 IMPLANT
TOWEL OR 17X26 4PK STRL BLUE (TOWEL DISPOSABLE) ×3 IMPLANT
TOWER CARTRIDGE SMART MIX (DISPOSABLE) ×3 IMPLANT
WRAPON POLAR PAD KNEE (MISCELLANEOUS) ×3

## 2017-05-10 NOTE — Anesthesia Procedure Notes (Signed)
Date/Time: 05/10/2017 12:35 PM Performed by: Johnna Acosta, CRNA Pre-anesthesia Checklist: Patient identified, Emergency Drugs available, Suction available, Patient being monitored and Timeout performed Patient Re-evaluated:Patient Re-evaluated prior to induction Oxygen Delivery Method: Simple face mask Preoxygenation: Pre-oxygenation with 100% oxygen

## 2017-05-10 NOTE — Transfer of Care (Signed)
Immediate Anesthesia Transfer of Care Note  Patient: Karen Parsons  Procedure(s) Performed: COMPUTER ASSISTED TOTAL KNEE ARTHROPLASTY (Right Knee)  Patient Location: PACU  Anesthesia Type:Spinal  Level of Consciousness: awake and alert   Airway & Oxygen Therapy: Patient Spontanous Breathing and Patient connected to face mask oxygen  Post-op Assessment: Report given to RN and Post -op Vital signs reviewed and stable  Post vital signs: Reviewed and stable  Last Vitals:  Vitals:   05/10/17 0938  BP: (!) 142/90  Pulse: 61  Resp: 15  Temp: 36.7 C  SpO2: 100%    Last Pain:  Vitals:   05/10/17 0938  TempSrc: Oral  PainSc: 3       Patients Stated Pain Goal: 1 (45/84/83 5075)  Complications: No apparent anesthesia complications

## 2017-05-10 NOTE — Progress Notes (Signed)
Pt handed over by [employer]. Per report, no bone foam available in the entire hospital. Dr. Marry Guan not aware. Per morning shift charge [employer], she will call and let Dr. Marry Guan know in the morning. Towel rolls kept and elevated heels off bed.

## 2017-05-10 NOTE — Anesthesia Procedure Notes (Signed)
Spinal  Patient location during procedure: OR Start time: 05/10/2017 12:25 PM End time: 05/10/2017 12:31 PM Staffing Anesthesiologist: Emmie Niemann, MD Resident/CRNA: Johnna Acosta, CRNA Performed: resident/CRNA  Preanesthetic Checklist Completed: patient identified, site marked, surgical consent, pre-op evaluation, timeout performed, IV checked, risks and benefits discussed and monitors and equipment checked Spinal Block Patient position: sitting Prep: ChloraPrep Patient monitoring: heart rate, continuous pulse ox, blood pressure and cardiac monitor Approach: midline Location: L4-5 Injection technique: single-shot Needle Needle type: Introducer and Pencil-Tip  Needle gauge: 24 G Needle length: 9 cm Additional Notes Negative paresthesia. Negative blood return. Positive free-flowing CSF. Expiration date of kit checked and confirmed. Patient tolerated procedure well, without complications.

## 2017-05-10 NOTE — Anesthesia Preprocedure Evaluation (Signed)
Anesthesia Evaluation  Patient identified by MRN, date of birth, ID band Patient awake    Reviewed: Allergy & Precautions, NPO status , Patient's Chart, lab work & pertinent test results  History of Anesthesia Complications Negative for: history of anesthetic complications  Airway Mallampati: II  TM Distance: >3 FB Neck ROM: Full    Dental no notable dental hx.    Pulmonary neg sleep apnea, neg COPD, former smoker,    breath sounds clear to auscultation- rhonchi (-) wheezing      Cardiovascular Exercise Tolerance: Good (-) hypertension(-) CAD, (-) Past MI and (-) Cardiac Stents  Rhythm:Regular Rate:Normal - Systolic murmurs and - Diastolic murmurs    Neuro/Psych CVA, No Residual Symptoms negative psych ROS   GI/Hepatic Neg liver ROS, hiatal hernia, GERD  ,  Endo/Other  negative endocrine ROSneg diabetes  Renal/GU negative Renal ROS     Musculoskeletal  (+) Arthritis ,   Abdominal (+) - obese,   Peds  Hematology negative hematology ROS (+)   Anesthesia Other Findings Past Medical History: No date: Barrett esophagus     Comment:  2018.  dr. Adria Devon said this has resolved after a recent              endoscopy July 2010: Barrett's esophagus     Comment:  Wohl,  No date: Benign neoplasm of colon No date: Bilateral renal cysts No date: Colon polyps No date: Cramp of limb No date: Diverticulosis No date: Generalized osteoarthritis No date: Genital prolapse No date: GERD (gastroesophageal reflux disease) No date: H/O seasonal allergies No date: Hallux valgus No date: Hemorrhoids No date: History of hiatal hernia No date: Hyperlipidemia     Comment:  intolerant of statins, normal diagnostic cath July 2010               Nehemiah Massed) No date: Hyperlipidemia No date: Left ovarian cyst No date: Osteoporosis     Comment:  by DEXA 2008 2011: S/P colonoscopy     Comment:  [name], polyp removed 11/2015: Stroke  ([place])     Comment:  TIA. no deficits   Reproductive/Obstetrics                             Lab Results  Component Value Date   WBC 4.3 04/28/2017   HGB 13.4 04/28/2017   HCT 41.4 04/28/2017   MCV 95.9 04/28/2017   PLT 249 04/28/2017    Anesthesia Physical Anesthesia Plan  ASA: II  Anesthesia Plan: Spinal   Post-op Pain Management:    Induction:   PONV Risk Score and Plan: 2 and Ondansetron and Propofol infusion  Airway Management Planned: Natural Airway  Additional Equipment:   Intra-op Plan:   Post-operative Plan:   Informed Consent: I have reviewed the patients History and Physical, chart, labs and discussed the procedure including the risks, benefits and alternatives for the proposed anesthesia with the patient or authorized representative who has indicated his/her understanding and acceptance.   Dental advisory given  Plan Discussed with: CRNA and Anesthesiologist  Anesthesia Plan Comments:         Anesthesia Quick Evaluation

## 2017-05-10 NOTE — Discharge Instructions (Signed)
°  Instructions after Total Knee Replacement ° ° Karen Parsons P. [name], [name], M.D.    ° Dept. of Orthopaedics & Sports Medicine ° [facility] ° [address] ° [place], [place]  [address] ° Phone: [phone]   Fax: [phone] ° °  °DIET: °• Drink plenty of non-alcoholic fluids. °• Resume your normal diet. Include foods high in fiber. ° °ACTIVITY:  °• You may use crutches or a walker with weight-bearing as tolerated, unless instructed otherwise. °• You may be weaned off of the walker or crutches by your Physical Therapist.  °• Do NOT place pillows under the knee. Anything placed under the knee could limit your ability to straighten the knee.   °• Continue doing gentle exercises. Exercising will reduce the pain and swelling, increase motion, and prevent muscle weakness.   °• Please continue to use the TED compression stockings for 6 weeks. You may remove the stockings at night, but should reapply them in the morning. °• Do not drive or operate any equipment until instructed. ° °WOUND CARE:  °• Continue to use the PolarCare or ice packs periodically to reduce pain and swelling. °• You may bathe or shower after the staples are removed at the first office visit following surgery. ° °MEDICATIONS: °• You may resume your regular medications. °• Please take the pain medication as prescribed on the medication. °• Do not take pain medication on an empty stomach. °• You have been given a prescription for a blood thinner (Lovenox or Coumadin). Please take the medication as instructed. (NOTE: After completing a 2 week course of Lovenox, take one Enteric-coated aspirin once a day. This along with elevation will help reduce the possibility of phlebitis in your operated leg.) °• Do not drive or drink alcoholic beverages when taking pain medications. ° °CALL THE OFFICE FOR: °• Temperature above 101 degrees °• Excessive bleeding or drainage on the dressing. °• Excessive swelling, coldness, or paleness of the toes. °• Persistent  nausea and vomiting. ° °FOLLOW-UP:  °• You should have an appointment to return to the office in 10-14 days after surgery. °• Arrangements have been made for continuation of Physical Therapy (either home therapy or outpatient therapy). °  °

## 2017-05-10 NOTE — H&P (Signed)
The patient has been re-examined, and the chart reviewed, and there have been no interval changes to the documented history and physical.    The risks, benefits, and alternatives have been discussed at length. The patient expressed understanding of the risks benefits and agreed with plans for surgical intervention.  James P. Hooten, [name] M.D.    

## 2017-05-10 NOTE — Anesthesia Post-op Follow-up Note (Signed)
Anesthesia QCDR form completed.        

## 2017-05-10 NOTE — Op Note (Signed)
OPERATIVE NOTE  DATE OF SURGERY:  05/10/2017  PATIENT NAME:  Karen Parsons   DOB: [birth year]  MRN: [id number]  PRE-OPERATIVE DIAGNOSIS: Degenerative arthrosis of the right knee, primary  POST-OPERATIVE DIAGNOSIS:  Same  PROCEDURE:  Right total knee arthroplasty using computer-assisted navigation  SURGEON:  Marciano Sequin. M.D.  ASSISTANT:  Vance Peper, PA (present and scrubbed throughout the case, critical for assistance with exposure, retraction, instrumentation, and closure)  ANESTHESIA: spinal  ESTIMATED BLOOD LOSS: 25 mL  FLUIDS REPLACED: 1600 mL of crystalloid  TOURNIQUET TIME: 88 minutes  DRAINS: 2 medium Hemovac drains  SOFT TISSUE RELEASES: Anterior cruciate ligament, posterior cruciate ligament, deep medial collateral ligament, patellofemoral ligament, and posterolateral corner  IMPLANTS UTILIZED: DePuy Attune size 5N posterior stabilized femoral component (cemented), size 4 rotating platform tibial component (cemented), 35 mm medialized dome patella (cemented), and a 7 mm stabilized rotating platform polyethylene insert.  INDICATIONS FOR SURGERY: Karen Parsons is a 76 y.o. year old female with a long history of progressive knee pain. X-rays demonstrated severe degenerative changes in tricompartmental fashion. The patient had not seen any significant improvement despite conservative nonsurgical intervention. After discussion of the risks and benefits of surgical intervention, the patient expressed understanding of the risks benefits and agree with plans for total knee arthroplasty.   The risks, benefits, and alternatives were discussed at length including but not limited to the risks of infection, bleeding, nerve injury, stiffness, blood clots, the need for revision surgery, cardiopulmonary complications, among others, and they were willing to proceed.  PROCEDURE IN DETAIL: The patient was brought into the operating room and, after adequate spinal anesthesia was achieved,  a tourniquet was placed on the patient's upper thigh. The patient's knee and leg were cleaned and prepped with alcohol and DuraPrep and draped in the usual sterile fashion. A "timeout" was performed as per usual protocol. The lower extremity was exsanguinated using an Esmarch, and the tourniquet was inflated to 300 mmHg. An anterior longitudinal incision was made followed by a standard mid vastus approach. The deep fibers of the medial collateral ligament were elevated in a subperiosteal fashion off of the medial flare of the tibia so as to maintain a continuous soft tissue sleeve. The patella was subluxed laterally and the patellofemoral ligament was incised. Inspection of the knee demonstrated severe degenerative changes with full-thickness loss of articular cartilage. Osteophytes were debrided using a rongeur. Anterior and posterior cruciate ligaments were excised. Two 4.0 mm Schanz pins were inserted in the femur and into the tibia for attachment of the array of trackers used for computer-assisted navigation. Hip center was identified using a circumduction technique. Distal landmarks were mapped using the computer. The distal femur and proximal tibia were mapped using the computer. The distal femoral cutting guide was positioned using computer-assisted navigation so as to achieve a 5 distal valgus cut. The femur was sized and it was felt that a size 5N femoral component was appropriate. A size 5 femoral cutting guide was positioned and the anterior cut was performed and verified using the computer. This was followed by completion of the posterior and chamfer cuts. Femoral cutting guide for the central box was then positioned in the center box cut was performed.  Attention was then directed to the proximal tibia. Medial and lateral menisci were excised. The extramedullary tibial cutting guide was positioned using computer-assisted navigation so as to achieve a 0 varus-valgus alignment and 3 posterior slope.  The cut was performed and verified using the computer. The proximal  tibia was sized and it was felt that a size 4 tibial tray was appropriate. Tibial and femoral trials were inserted followed by insertion of a 5 mm polyethylene insert.  The knee was felt to be tight laterally.  The trial components were removed and the knee was brought into full extension and distracted using the Moreland retractors.  The posterior lateral corner was carefully released using combination of electrocautery and Metzenbaum scissors.  Trial components were reinserted followed by insertion of a 7 mm polyethylene trial.  This allowed for excellent mediolateral soft tissue balancing both in flexion and in full extension. Finally, the patella was cut and prepared so as to accommodate a 35 mm medialized dome patella. A patella trial was placed and the knee was placed through a range of motion with excellent patellar tracking appreciated. The femoral trial was removed after debridement of posterior osteophytes. The central post-hole for the tibial component was reamed followed by insertion of a keel punch. Tibial trials were then removed. Cut surfaces of bone were irrigated with copious amounts of normal saline with antibiotic solution using pulsatile lavage and then suctioned dry. Polymethylmethacrylate cement was prepared in the usual fashion using a vacuum mixer. Cement was applied to the cut surface of the proximal tibia as well as along the undersurface of a size 4 rotating platform tibial component. Tibial component was positioned and impacted into place. Excess cement was removed using [employer] fast streamer. Cement was then applied to the cut surfaces of the femur as well as along the posterior flanges of the size 5N femoral component. The femoral component was positioned and impacted into place. Excess cement was removed using [employer] fast streamer. A 7 mm polyethylene trial was inserted and the knee was brought into full extension with steady  axial compression applied. Finally, cement was applied to the backside of a 35 mm medialized dome patella and the patellar component was positioned and patellar clamp applied. Excess cement was removed using [employer] fast streamer. After adequate curing of the cement, the tourniquet was deflated after a total tourniquet time of 88 minutes. Hemostasis was achieved using electrocautery. The knee was irrigated with copious amounts of normal saline with antibiotic solution using pulsatile lavage and then suctioned dry. 20 mL of 1.3% Exparel and 60 mL of 0.25% Marcaine in 40 mL of normal saline was injected along the posterior capsule, medial and lateral gutters, and along the arthrotomy site. A 7 mm stabilized rotating platform polyethylene insert was inserted and the knee was placed through a range of motion with excellent mediolateral soft tissue balancing appreciated and excellent patellar tracking noted. 2 medium drains were placed in the wound bed and brought out through separate stab incisions. The medial parapatellar portion of the incision was reapproximated using interrupted sutures of #1 Vicryl. Subcutaneous tissue was approximated in layers using first #0 Vicryl followed #2-0 Vicryl. The skin was approximated with skin staples. A sterile dressing was applied.  The patient tolerated the procedure well and was transported to the recovery room in stable condition.    James P. Holley Bouche., M.D.

## 2017-05-11 ENCOUNTER — Encounter: Payer: Self-pay | Admitting: Orthopedic Surgery

## 2017-05-11 NOTE — Progress Notes (Signed)
Clinical Social Worker (CSW) received SNF consult. PT is recommending home health. RN case manager aware of above. Please reconsult if future social work needs arise. CSW signing off.   Eldene Plocher, LCSW [phone] 

## 2017-05-11 NOTE — Progress Notes (Signed)
Nurse notified that pt is feeling dizzy and lightheaded. Pt was working with therapy now sitting up in recliner.Vitals taken pts BP 71/48. MD Hooten notified. Vitals retaken BP now 114/63. Per MD obtain orthostatic vitals when pt goes back to bed. Pt states" she feels better". Pt sitting in recliner.

## 2017-05-11 NOTE — Progress Notes (Addendum)
   Subjective: 1 Day Post-Op Procedure(s) (LRB): COMPUTER ASSISTED TOTAL KNEE ARTHROPLASTY (Right) Patient reports pain as 0 on 0-10 scale.   Patient is well, and has had no acute complaints or problems Denies any CP, SOB, ABD pain. We will continue therapy today.  Plan is to go Home after hospital stay.  Objective: Vital signs in last 24 hours: Temp:  [97.3 F (36.3 C)-98.6 F (37 C)] 97.8 F (36.6 C) (11/20 0512) Pulse Rate:  [43-61] 53 (11/20 0514) Resp:  [13-18] 16 (11/20 0512) BP: (104-154)/(71-91) 121/75 (11/20 0512) SpO2:  [90 %-100 %] 96 % (11/20 0514) Weight:  [50.3 kg (111 lb)] 50.3 kg (111 lb) (11/19 2725)  Intake/Output from previous day: 11/19 0701 - 11/20 0700 In: 3836.7 [P.O.:360; I.V.:2966.7; IV Piggyback:510] Out: 2185 [Urine:2000; Drains:160; Blood:25] Intake/Output this shift: No intake/output data recorded.  No results for input(s): HGB in the last 72 hours. No results for input(s): WBC, RBC, HCT, PLT in the last 72 hours. No results for input(s): NA, K, CL, CO2, BUN, CREATININE, GLUCOSE, CALCIUM in the last 72 hours. No results for input(s): LABPT, INR in the last 72 hours.  EXAM General - Patient is Alert, Appropriate and Oriented Extremity - Neurovascular intact Sensation intact distally Intact pulses distally Dorsiflexion/Plantar flexion intact No cellulitis present Compartment soft Dressing - dressing C/D/I, no drainage and Hemovac intact Motor Function - intact, moving foot and toes well on exam.   Past Medical History:  Diagnosis Date  . Barrett esophagus    2018.  dr. Adria Devon said this has resolved after a recent endoscopy  . Barrett's esophagus July 2010   Wohl,   . Benign neoplasm of colon   . Bilateral renal cysts   . Colon polyps   . Cramp of limb   . Diverticulosis   . Generalized osteoarthritis   . Genital prolapse   . GERD (gastroesophageal reflux disease)   . H/O seasonal allergies   . Hallux valgus   . Hemorrhoids   .  History of hiatal hernia   . Hyperlipidemia    intolerant of statins, normal diagnostic cath July 2010 Nehemiah Massed)  . Hyperlipidemia   . Left ovarian cyst   . Osteoporosis    by DEXA 2008  . S/P colonoscopy 2011   [name], polyp removed  . Stroke ([place]) 11/2015   TIA. no deficits    Assessment/Plan:   1 Day Post-Op Procedure(s) (LRB): COMPUTER ASSISTED TOTAL KNEE ARTHROPLASTY (Right) Active Problems:   S/P total knee arthroplasty  Estimated body mass index is 20.97 kg/m as calculated from the following:   Height as of this encounter: 5\' 1"  (1.549 m).   Weight as of this encounter: 50.3 kg (111 lb). Advance diet Up with therapy  Needs bowel movement Pain is well controlled.  Vital signs stable.  Patient doing well this morning asymptomatic Care management to assist with discharge to home with home health PT   DVT Prophylaxis - Lovenox, Foot Pumps and TED hose Weight-Bearing as tolerated to right leg   T. Rachelle Hora, PA-C [place] 05/11/2017, 7:22 AM

## 2017-05-11 NOTE — NC FL2 (Signed)
[place] LEVEL OF CARE SCREENING TOOL     IDENTIFICATION  Patient Name: Karen Parsons: Aug 16, 1940 Sex: female Admission Date (Current Location): 05/10/2017  [place] and [place] Number:  Engineering geologist and Address:  [facility], [address] [place], [place] [address]      Provider Number: [id number]  Attending Physician Name and Address:  Dereck Leep, MD  Relative Name and Phone Number:       Current Level of Care: Hospital Recommended Level of Care: [place] Prior Approval Number:    Date Approved/Denied:   PASRR Number: [id number])  Discharge Plan: SNF    Current Diagnoses: Patient Active Problem List   Diagnosis Date Noted  . S/P total knee arthroplasty 05/10/2017  . Vitamin D deficiency, unspecified 12/10/2016  . CVA (cerebral infarction) 12/18/2015  . Acute renal insufficiency 12/18/2015  . Hyperglycemia 12/18/2015  . Tremor 12/18/2015  . Transient cerebral ischemic attack 12/18/2015  . Aortic atherosclerosis ([place]) 08/01/2015  . Bilateral renal cysts 08/01/2015  . Generalized osteoarthritis 08/01/2015  . Left ovarian cyst 08/01/2015  . Thumb pain 07/29/2015  . Acute diverticulitis 07/01/2015  . Adnexal mass 07/01/2015  . Underweight 11/10/2014  . Muscle cramps at night 11/10/2014  . Complaints of leg weakness 11/10/2014  . Medicare annual wellness visit, subsequent 04/18/2014  . Prolapse of female pelvic organs 07/11/2013  . Screening for breast cancer 10/29/2012  . Tubular adenoma of colon 10/29/2012  . Familial multiple lipoprotein-type hyperlipidemia   . GERD (gastroesophageal reflux disease)   . S/P colonoscopy   . Barrett esophagus     Orientation RESPIRATION BLADDER Height & Weight     Self, Time, Situation, Place  Normal Continent Weight: 111 lb (50.3 kg) Height:  5\' 1"  (154.9 cm)  BEHAVIORAL SYMPTOMS/MOOD NEUROLOGICAL BOWEL NUTRITION STATUS      Continent  Diet(Regular)  AMBULATORY STATUS COMMUNICATION OF NEEDS Skin   Extensive Assist Verbally Surgical wounds(Incision Right Knee)                       Personal Care Assistance Level of Assistance  Bathing, Feeding, Dressing Bathing Assistance: Limited assistance Feeding assistance: Independent Dressing Assistance: Limited assistance     Functional Limitations Info  Sight, Hearing, Speech Sight Info: Impaired Hearing Info: Adequate Speech Info: Adequate    SPECIAL CARE FACTORS FREQUENCY  PT (By licensed PT), OT (By licensed OT)     PT Frequency: (5) OT Frequency: (5)            Contractures      Additional Factors Info  Code Status, Allergies Code Status Info: (Full Code) Allergies Info: (SULFAMETHOXAZOLE-TRIMETHOPRIM, EZETIMIBE, STATINS, WELCHOL COLESEVELAM )           Current Medications (05/11/2017):  This is the current hospital active medication list Current Facility-Administered Medications  Medication Dose Route Frequency Provider Last Rate Last Dose  . 0.9 %  sodium chloride infusion   Intravenous Continuous Hooten, Laurice Record, MD   Stopped at 05/11/17 0848  . acetaminophen (OFIRMEV) IV 1,000 mg  1,000 mg Intravenous Q6H Hooten, Laurice Record, MD   Stopped at 05/11/17 [phone]  . acetaminophen (TYLENOL) tablet 650 mg  650 mg Oral Q4H PRN Hooten, Laurice Record, MD       Or  . acetaminophen (TYLENOL) suppository 650 mg  650 mg Rectal Q4H PRN Hooten, Laurice Record, MD      . alum & mag hydroxide-simeth (MAALOX/MYLANTA) 200-200-20 MG/5ML suspension 30  mL  30 mL Oral Q4H PRN Hooten, Laurice Record, MD      . bisacodyl (DULCOLAX) suppository 10 mg  10 mg Rectal Daily PRN Hooten, Laurice Record, MD      . ceFAZolin (ANCEF) 2 g in dextrose 5 % 100 mL IVPB  2 g Intravenous Q6H Hooten, Laurice Record, MD   Stopped at 05/11/17 0159  . celecoxib (CELEBREX) capsule 200 mg  200 mg Oral Q12H Hooten, Laurice Record, MD   200 mg at 05/11/17 0843  . cholecalciferol (VITAMIN D) tablet 2,000 Units  2,000 Units Oral Daily  Dereck Leep, MD   2,000 Units at 05/11/17 [phone]  . diphenhydrAMINE (BENADRYL) 12.5 MG/5ML elixir 12.5-25 mg  12.5-25 mg Oral Q4H PRN Hooten, Laurice Record, MD      . enoxaparin (LOVENOX) injection 30 mg  30 mg Subcutaneous Q12H Hooten, Laurice Record, MD   30 mg at 05/11/17 0843  . ferrous sulfate tablet 325 mg  325 mg Oral BID WC Dereck Leep, MD   325 mg at 05/11/17 0843  . ipratropium (ATROVENT) 0.03 % nasal spray 1 spray  1 spray Each Nare Daily PRN Hooten, Laurice Record, MD      . magnesium hydroxide (MILK OF MAGNESIA) suspension 30 mL  30 mL Oral Daily PRN Hooten, Laurice Record, MD   30 mL at 05/11/17 0843  . menthol-cetylpyridinium (CEPACOL) lozenge 3 mg  1 lozenge Oral PRN Hooten, Laurice Record, MD       Or  . phenol (CHLORASEPTIC) mouth spray 1 spray  1 spray Mouth/Throat PRN Hooten, Laurice Record, MD      . metoCLOPramide (REGLAN) tablet 10 mg  10 mg Oral TID AC & HS Hooten, Laurice Record, MD   10 mg at 05/11/17 0843  . morphine 2 MG/ML injection 2 mg  2 mg Intravenous Q2H PRN Hooten, Laurice Record, MD      . ondansetron (ZOFRAN) tablet 4 mg  4 mg Oral Q6H PRN Hooten, Laurice Record, MD       Or  . ondansetron (ZOFRAN) injection 4 mg  4 mg Intravenous Q6H PRN Hooten, Laurice Record, MD      . oxyCODONE (Oxy IR/ROXICODONE) immediate release tablet 10 mg  10 mg Oral Q3H PRN Hooten, Laurice Record, MD      . oxyCODONE (Oxy IR/ROXICODONE) immediate release tablet 5 mg  5 mg Oral Q3H PRN Dereck Leep, MD   5 mg at 05/11/17 0844  . pantoprazole (PROTONIX) EC tablet 40 mg  40 mg Oral BID Dereck Leep, MD   40 mg at 05/11/17 0843  . senna-docusate (Senokot-S) tablet 1 tablet  1 tablet Oral BID Dereck Leep, MD   1 tablet at 05/11/17 0843  . sodium phosphate (FLEET) 7-19 GM/118ML enema 1 enema  1 enema Rectal Once PRN Hooten, Laurice Record, MD      . traMADol Veatrice Bourbon) tablet 50-100 mg  50-100 mg Oral Q4H PRN Hooten, Laurice Record, MD         Discharge Medications: Please see discharge summary for a list of discharge medications.  Relevant Imaging  Results:  Relevant Lab Results:   Additional Information (SSN: [id number])  Smith Mince, Student-Social Work

## 2017-05-11 NOTE — Evaluation (Signed)
Occupational Therapy Evaluation Patient Details Name: Karen Parsons MRN: [id number] DOB: 21-Apr-1941 Today's Date: 05/11/2017    History of Present Illness Pt admitted for R TKR.   Clinical Impression   Pt is 76 year old female s/p R TKR.  Pt was independent in all ADLs prior to surgery and is eager to return to PLOF.  Pt currently requires minimal assist for LB dressing while in seated position due to pain and limited AROM of R knee. Pt/spouse educated in polar care mgt, compression stocking mgt, safety, DME/AE, and falls prevention to support safety and functional independence in the home. Pt/spouse verbalized understanding. Pt would benefit from additional  instruction in dressing techniques with or without assistive devices for dressing and bathing skills.  Pt would also benefit from recommendations for home modifications to increase safety in the bathroom and prevent falls. Will assess for OT [facility] needs as pt progresses in therapy.      Follow Up Recommendations  No OT follow up    Equipment Recommendations  None recommended by OT    Recommendations for Other Services       Precautions / Restrictions Precautions Precautions: Fall;Knee Precaution Booklet Issued: No Restrictions Weight Bearing Restrictions: Yes RLE Weight Bearing: Weight bearing as tolerated      Mobility Bed Mobility Overal bed mobility: Needs Assistance Bed Mobility: Supine to Sit;Sit to Supine     Supine to sit: Min guard Sit to supine: Min guard   General bed mobility comments: safe technique performed with cues for sequencing. Once seated at EOB, no dizziness noted  Transfers Overall transfer level: Needs assistance Equipment used: Rolling walker (2 wheeled) Transfers: Sit to/from Stand Sit to Stand: Min guard         General transfer comment: no dizziness noted this session    Balance Overall balance assessment: Needs assistance Sitting-balance support: Feet supported Sitting balance-Leahy  Scale: Good     Standing balance support: Bilateral upper extremity supported Standing balance-Leahy Scale: Good                             ADL either performed or assessed with clinical judgement   ADL Overall ADL's : Needs assistance/impaired Eating/Feeding: Set up   Grooming: Set up   Upper Body Bathing: Sitting;Set up;Supervision/ safety   Lower Body Bathing: Sitting/lateral leans;Minimal assistance   Upper Body Dressing : Sitting;Set up   Lower Body Dressing: Sit to/from stand;Minimal assistance Lower Body Dressing Details (indicate cue type and reason): pt/spouse educated in AE for LB dressing tasks to maximize safety and functional independence, verbalized understanding. Toilet Transfer: RW;Min guard                   Vision Baseline Vision/History: Wears glasses Wears Glasses: At all times Patient Visual Report: No change from baseline       Perception     Praxis      Pertinent Vitals/Pain Pain Assessment: No/denies pain     Hand Dominance Right   Extremity/Trunk Assessment Upper Extremity Assessment Upper Extremity Assessment: Overall WFL for tasks assessed   Lower Extremity Assessment Lower Extremity Assessment: Generalized weakness;Defer to PT evaluation   Cervical / Trunk Assessment Cervical / Trunk Assessment: Normal   Communication Communication Communication: No difficulties   Cognition Arousal/Alertness: Awake/alert Behavior During Therapy: WFL for tasks assessed/performed Overall Cognitive Status: Within Functional Limits for tasks assessed  General Comments       Exercises Other Exercises Other Exercises: pt/spouse educated in polar care mgt, compression stocking mgt, safety, DME/AE, and falls prevention to support safety and functional independence in the home. Pt/spouse verbalized understanding.   Shoulder Instructions      Home Living Family/patient expects  to be discharged to:: Private residence Living Arrangements: Spouse/significant other Available Help at Discharge: Family;Available 24 hours/day Type of Home: House Home Access: Stairs to enter [employer] of Steps: 3 Entrance Stairs-Rails: None(has grab bar but can't reach from bottom step) Home Layout: One level     Bathroom Shower/Tub: Walk-in shower;Door   Bathroom Toilet: Handicapped height     Home Equipment: Walker - 2 wheels          Prior Functioning/Environment Level of Independence: Independent        Comments: active, did not use RW prior to admission. No falls.         OT Problem List: Decreased strength;Decreased knowledge of use of DME or AE;Decreased knowledge of precautions;Decreased range of motion      OT Treatment/Interventions: Self-care/ADL training;Therapeutic activities;DME and/or AE instruction;Patient/family education    OT Goals(Current goals can be found in the care plan section) Acute Rehab OT Goals Patient Stated Goal: to get stronger OT Goal Formulation: With patient/family Time For Goal Achievement: 05/18/17 Potential to Achieve Goals: Good ADL Goals Pt Will Perform Lower Body Dressing: with supervision;with set-up;sit to/from stand(LRAD as needed) Pt Will Transfer to Toilet: with supervision;ambulating(comfort height toilet, RW for ambulation) Additional ADL Goal #1: Pt/spouse will verbalize understanding of polar care and compression stocking mgt to maximize safety and recovery at home.  OT Frequency: Min 1X/week   Barriers to D/C:            Co-evaluation              AM-PAC PT "6 Clicks" Daily Activity     Outcome Measure Help from another person eating meals?: None Help from another person taking care of personal grooming?: None Help from another person toileting, which includes using toliet, bedpan, or urinal?: A Little Help from another person bathing (including washing, rinsing, drying)?: A Little Help  from another person to put on and taking off regular upper body clothing?: None Help from another person to put on and taking off regular lower body clothing?: A Little 6 Click Score: 21   End of Session    Activity Tolerance: Patient tolerated treatment well Patient left: in bed;with call bell/phone within reach;with bed alarm set;with family/visitor present;Other (comment)(CM in room at end of session)  OT Visit Diagnosis: Other abnormalities of gait and mobility (R26.89)                Time: 7793-9030 OT Time Calculation (min): 18 min Charges:  OT General Charges $OT Visit: 1 Visit OT Evaluation $OT Eval Low Complexity: 1 Low OT Treatments $Self Care/Home Management : 8-22 mins G-Codes: OT G-codes **NOT FOR INPATIENT CLASS** Functional Assessment Tool Used: AM-PAC 6 Clicks Daily Activity;Clinical judgement Functional Limitation: Self care Self Care Current Status (S9233): At least 20 percent but less than 40 percent impaired, limited or restricted Self Care Goal Status (A0762): At least 1 percent but less than 20 percent impaired, limited or restricted   Jeni Salles, MPH, MS, OTR/L ascom [phone] 05/11/17, 2:50 PM

## 2017-05-11 NOTE — Anesthesia Postprocedure Evaluation (Signed)
Anesthesia Post Note  Patient: Karen Parsons  Procedure(s) Performed: COMPUTER ASSISTED TOTAL KNEE ARTHROPLASTY (Right Knee)  Patient location during evaluation: Nursing Unit Anesthesia Type: Spinal Level of consciousness: awake and alert and oriented Pain management: pain level controlled Vital Signs Assessment: post-procedure vital signs reviewed and stable Respiratory status: respiratory function stable Cardiovascular status: blood pressure returned to baseline and stable Postop Assessment: no headache, no backache, spinal receding, patient able to bend at knees, no apparent nausea or vomiting and adequate PO intake Anesthetic complications: no     Last Vitals:  Vitals:   05/11/17 0512 05/11/17 0514  BP: 121/75   Pulse: (!) 50 (!) 53  Resp: 16   Temp: 36.6 C   SpO2: 97% 96%    Last Pain:  Vitals:   05/11/17 0512  TempSrc: Oral  PainSc:                  Blima Singer

## 2017-05-11 NOTE — Care Management Note (Addendum)
Case Management Note  Patient Details  Name: Karen Parsons MRN: [id number] Date of Birth: 07/13/1940  Subjective/Objective:  POD # 1 right TKA. Met with patient and her friend at bedside. This friend is a Industrial/product designer and will be staying with patient after discharge. She has a walker and a cane. Denies the need for a bsc. Offered choice of home health agencies. Patient prefers [facility]. Referral sent to [facility]. Pharmacy: [place]. [phone]. Will call Lovenox prior to discharge.                      Action/Plan: [facility] for HHPT, No DME, Lovenox prior to discharge.   Expected Discharge Date:                  Expected Discharge Plan:  [place]  In-House Referral:     Discharge planning Services  CM Consult  Post Acute Care Choice:  Home Health Choice offered to:  Patient  DME Arranged:    DME Agency:     HH Arranged:  PT [place]:  [facility]  Status of Service:  In process, will continue to follow  If discussed at Long Length of Stay Meetings, dates discussed:    Additional Comments:  Jolly Mango, RN 05/11/2017, 2:34 PM

## 2017-05-11 NOTE — Progress Notes (Signed)
Physical Therapy Treatment Patient Details Name: Karen Parsons MRN: [id number] DOB: 1941-01-21 Today's Date: 05/11/2017    History of Present Illness Pt admitted for R TKR.    PT Comments    Pt is making good progress towards goals with ability to safely navigate around RN station. No rest breaks required and no dizziness noted. Good speed, completing 10' walk in 10 seconds. Written HEP given and reviewed with pt. Good endurance noted with there-ex. Pt very motivated to complete therapy. Will continue to progress to stair training next date.   Follow Up Recommendations  Home health PT     Equipment Recommendations  None recommended by PT    Recommendations for Other Services       Precautions / Restrictions Precautions Precautions: Fall;Knee Precaution Booklet Issued: Yes (comment) Restrictions Weight Bearing Restrictions: Yes RLE Weight Bearing: Weight bearing as tolerated    Mobility  Bed Mobility Overal bed mobility: Needs Assistance Bed Mobility: Supine to Sit;Sit to Supine     Supine to sit: Min guard Sit to supine: Min guard   General bed mobility comments: safe technique performed with cues for sequencing. Once seated at EOB, no dizziness noted  Transfers Overall transfer level: Needs assistance Equipment used: Rolling walker (2 wheeled) Transfers: Sit to/from Stand Sit to Stand: Min guard         General transfer comment: no dizziness noted this session. When standing, pt tries to adjust gown, causing LOB, needing min assist for correction.   Ambulation/Gait Ambulation/Gait assistance: Min guard Ambulation Distance (Feet): 250 Feet Assistive device: Rolling walker (2 wheeled) Gait Pattern/deviations: Step-through pattern     General Gait Details: ambulated around RN station progressing to reciprocal gait pattern. No reports of dizziness noted. Able to look forward during ambulation. Cued for heel strike during gait.   Stairs             Wheelchair Mobility    Modified Rankin (Stroke Patients Only)       Balance Overall balance assessment: Needs assistance Sitting-balance support: Feet supported Sitting balance-Leahy Scale: Good     Standing balance support: Bilateral upper extremity supported Standing balance-Leahy Scale: Good                              Cognition Arousal/Alertness: Awake/alert Behavior During Therapy: WFL for tasks assessed/performed Overall Cognitive Status: Within Functional Limits for tasks assessed                                        Exercises Total Joint Exercises Goniometric ROM: R knee AAROM: 3-90 degrees Other Exercises Other Exercises: pt/spouse educated in polar care mgt, compression stocking mgt, safety, DME/AE, and falls prevention to support safety and functional independence in the home. Pt/spouse verbalized understanding. Other Exercises: R LE ther-ex performed including ankle pump, quad sets, SLRs, hip abd/add, and SAQ. All ther-ex performed x 10 reps as well as educated on written HEP Other Exercises: ambulated to bathroom with supervision. Able to perform hygiene with safe technique.    General Comments        Pertinent Vitals/Pain Pain Assessment: 0-10 Pain Score: 3  Pain Location: R knee Pain Descriptors / Indicators: Operative site guarding Pain Intervention(s): Limited activity within patient's tolerance;Ice applied    Home Living Family/patient expects to be discharged to:: Private residence Living Arrangements: Spouse/significant other Available Help at  Discharge: Family;Available 24 hours/day Type of Home: House Home Access: Stairs to enter Entrance Stairs-Rails: None(has grab bar but can't reach from bottom step) Home Layout: One level Home Equipment: Walker - 2 wheels      Prior Function Level of Independence: Independent      Comments: active, did not use RW prior to admission. No falls.    PT Goals (current  goals can now be found in the care plan section) Acute Rehab PT Goals Patient Stated Goal: to get stronger PT Goal Formulation: With patient Time For Goal Achievement: 05/25/17 Potential to Achieve Goals: Good Additional Goals Additional Goal #1: Pt will be able to perform bed mobility/transfers with supervision and safe technique in order to improve functional independence Progress towards PT goals: Progressing toward goals    Frequency    BID      PT Plan Current plan remains appropriate    Co-evaluation              AM-PAC PT "6 Clicks" Daily Activity  Outcome Measure  Difficulty turning over in bed (including adjusting bedclothes, sheets and blankets)?: Unable Difficulty moving from lying on back to sitting on the side of the bed? : Unable Difficulty sitting down on and standing up from a chair with arms (e.g., wheelchair, bedside commode, etc,.)?: Unable Help needed moving to and from a bed to chair (including a wheelchair)?: A Little Help needed walking in hospital room?: A Little Help needed climbing 3-5 steps with a railing? : A Little 6 Click Score: 12    End of Session Equipment Utilized During Treatment: Gait belt Activity Tolerance: Patient tolerated treatment well Patient left: in bed;with bed alarm set;with SCD's reapplied Nurse Communication: Mobility status PT Visit Diagnosis: Muscle weakness (generalized) (M62.81);Difficulty in walking, not elsewhere classified (R26.2);Pain Pain - Right/Left: Right Pain - part of body: Knee     Time: 5397-6734 PT Time Calculation (min) (ACUTE ONLY): 39 min  Charges:  $Gait Training: 8-22 mins $Therapeutic Exercise: 8-22 mins $Therapeutic Activity: 8-22 mins                    G Codes:  Functional Assessment Tool Used: AM-PAC 6 Clicks Basic Mobility Functional Limitation: Mobility: Walking and moving around Mobility: Walking and Moving Around Current Status (L9379): At least 60 percent but less than 80 percent  impaired, limited or restricted Mobility: Walking and Moving Around Goal Status [phone]): At least 40 percent but less than 60 percent impaired, limited or restricted    Greggory Stallion, PT, DPT [phone]    Karen Parsons 05/11/2017, 3:16 PM

## 2017-05-11 NOTE — Evaluation (Signed)
Physical Therapy Evaluation Patient Details Name: Karen Parsons MRN: [id number] DOB: 1940/12/30 Today's Date: 05/11/2017   History of Present Illness  Pt admitted for R TKR.  Clinical Impression  Pt is a pleasant 76 year old female who was admitted for R TKR. Pt performs bed mobility, transfers, and ambulation with cga and RW. Pt demonstrates deficits with strength/balance/mobility. Good tolerance with ROM and there-ex. Does complain of intermittent dizziness with turns during ambulation. Appears to resolve quickly. Pt demonstrates ability to perform 10 SLRs with independence, therefore does not require KI for mobility. Would benefit from skilled PT to address above deficits and promote optimal return to PLOF. Recommend transition to [facility] upon discharge from acute hospitalization.       Follow Up Recommendations Home health PT    Equipment Recommendations  None recommended by PT    Recommendations for Other Services       Precautions / Restrictions Precautions Precautions: Fall;Knee Precaution Booklet Issued: No Restrictions Weight Bearing Restrictions: Yes RLE Weight Bearing: Weight bearing as tolerated      Mobility  Bed Mobility Overal bed mobility: Needs Assistance Bed Mobility: Supine to Sit     Supine to sit: Min guard     General bed mobility comments: safe technique performed with cues for sequencing. Once seated at EOB, no dizziness noted  Transfers Overall transfer level: Needs assistance Equipment used: Rolling walker (2 wheeled) Transfers: Sit to/from Stand Sit to Stand: Min guard         General transfer comment: slight dizziness noted with transition to standing. RW used.   Ambulation/Gait Ambulation/Gait assistance: Min guard Ambulation Distance (Feet): 40 Feet Assistive device: Rolling walker (2 wheeled) Gait Pattern/deviations: Step-to pattern     General Gait Details: ambulated with step to gait pattern and use of RW. Safe technique  performed. Occasional dizziness noted during turns.  Stairs            Wheelchair Mobility    Modified Rankin (Stroke Patients Only)       Balance Overall balance assessment: Needs assistance Sitting-balance support: Feet supported Sitting balance-Leahy Scale: Good     Standing balance support: Bilateral upper extremity supported Standing balance-Leahy Scale: Good                               Pertinent Vitals/Pain Pain Assessment: No/denies pain    Home Living Family/patient expects to be discharged to:: Private residence Living Arrangements: Spouse/significant other Available Help at Discharge: Family;Available 24 hours/day Type of Home: House Home Access: Stairs to enter Entrance Stairs-Rails: None(does have grab bar, but cant reach from bottom step) Entrance Stairs-Number of Steps: 3 Home Layout: One level Home Equipment: Walker - 2 wheels      Prior Function Level of Independence: Independent         Comments: active, did not use RW prior to admission     Hand Dominance        Extremity/Trunk Assessment   Upper Extremity Assessment Upper Extremity Assessment: Overall WFL for tasks assessed    Lower Extremity Assessment Lower Extremity Assessment: Generalized weakness(R LE grossly 3/5)       Communication   Communication: No difficulties  Cognition Arousal/Alertness: Awake/alert Behavior During Therapy: WFL for tasks assessed/performed Overall Cognitive Status: Within Functional Limits for tasks assessed  General Comments      Exercises Total Joint Exercises Goniometric ROM: R knee AAROM: 3-90 degrees Other Exercises Other Exercises: supine ther-ex performed including R LE ankle pumps, quad sets, SLRs, hip ab/add, and seated knee flexion stretches. All ther-ex performed x 10 reps with cga and cues for sequencing. Safe technique performed   Assessment/Plan    PT  Assessment Patient needs continued PT services  PT Problem List Decreased strength;Decreased range of motion;Decreased balance;Decreased mobility;Pain       PT Treatment Interventions DME instruction;Gait training;Stair training;Therapeutic exercise    PT Goals (Current goals can be found in the Care Plan section)  Acute Rehab PT Goals Patient Stated Goal: to get stronger PT Goal Formulation: With patient Time For Goal Achievement: 05/25/17 Potential to Achieve Goals: Good    Frequency BID   Barriers to discharge        Co-evaluation               AM-PAC PT "6 Clicks" Daily Activity  Outcome Measure Difficulty turning over in bed (including adjusting bedclothes, sheets and blankets)?: Unable Difficulty moving from lying on back to sitting on the side of the bed? : Unable Difficulty sitting down on and standing up from a chair with arms (e.g., wheelchair, bedside commode, etc,.)?: Unable Help needed moving to and from a bed to chair (including a wheelchair)?: A Little Help needed walking in hospital room?: A Little Help needed climbing 3-5 steps with a railing? : A Little 6 Click Score: 12    End of Session Equipment Utilized During Treatment: Gait belt Activity Tolerance: Patient tolerated treatment well Patient left: in chair;with chair alarm set;with family/visitor present;with SCD's reapplied Nurse Communication: Mobility status PT Visit Diagnosis: Muscle weakness (generalized) (M62.81);Difficulty in walking, not elsewhere classified (R26.2);Pain Pain - Right/Left: Right Pain - part of body: Knee    Time: 0910-0944 PT Time Calculation (min) (ACUTE ONLY): 34 min   Charges:   PT Evaluation $PT Eval Low Complexity: 1 Low PT Treatments $Therapeutic Exercise: 8-22 mins   PT G Codes:   PT G-Codes **NOT FOR INPATIENT CLASS** Functional Assessment Tool Used: AM-PAC 6 Clicks Basic Mobility Functional Limitation: Mobility: Walking and moving around Mobility:  Walking and Moving Around Current Status (E7035): At least 60 percent but less than 80 percent impaired, limited or restricted Mobility: Walking and Moving Around Goal Status [phone]): At least 40 percent but less than 60 percent impaired, limited or restricted    Greggory Stallion, PT, DPT [phone]   Yves Fodor 05/11/2017, 11:31 AM

## 2017-05-12 MED ORDER — TRAMADOL HCL 50 MG PO TABS: 50.0000 mg | ORAL_TABLET | Freq: Two times a day (BID) | ORAL | 0 refills | Status: DC | PRN

## 2017-05-12 MED ORDER — OXYCODONE HCL 5 MG PO TABS: 5.0000 mg | ORAL_TABLET | ORAL | 0 refills | Status: DC | PRN

## 2017-05-12 MED ORDER — ENOXAPARIN SODIUM 40 MG/0.4ML ~~LOC~~ SOLN: 40.0000 mg | SUBCUTANEOUS | 0 refills | Status: DC

## 2017-05-12 MED ORDER — CELECOXIB 200 MG PO CAPS: 200.0000 mg | ORAL_CAPSULE | Freq: Two times a day (BID) | ORAL | 1 refills | Status: DC

## 2017-05-12 NOTE — Progress Notes (Signed)
   Subjective: 2 Days Post-Op Procedure(s) (LRB): COMPUTER ASSISTED TOTAL KNEE ARTHROPLASTY (Right) Patient reports pain as 0 on 0-10 scale.   Patient is well, and has had no acute complaints or problems Denies any CP, SOB, ABD pain. We will continue therapy today.  Plan is to go Home after hospital stay.  Objective: Vital signs in last 24 hours: Temp:  [97.5 F (36.4 C)-98.5 F (36.9 C)] 98.5 F (36.9 C) (11/20 1954) Pulse Rate:  [51-67] 65 (11/20 1954) Resp:  [16-18] 16 (11/20 1954) BP: (71-151)/(48-87) 113/71 (11/20 1954) SpO2:  [96 %-100 %] 99 % (11/20 1954)  Intake/Output from previous day: 11/20 0701 - 11/21 0700 In: 66 [P.O.:720; IV Piggyback:100] Out: 50 [Drains:50] Intake/Output this shift: Total I/O In: -  Out: 50 [Drains:50]  No results for input(s): HGB in the last 72 hours. No results for input(s): WBC, RBC, HCT, PLT in the last 72 hours. No results for input(s): NA, K, CL, CO2, BUN, CREATININE, GLUCOSE, CALCIUM in the last 72 hours. No results for input(s): LABPT, INR in the last 72 hours.  EXAM General - Patient is Alert, Appropriate and Oriented Extremity - Neurovascular intact Sensation intact distally Intact pulses distally Dorsiflexion/Plantar flexion intact No cellulitis present Compartment soft Dressing - clean and dry, and intact. The Hemovac was removed. Motor Function - intact, moving foot and toes well on exam. The patient ambulated 250 feet.  Past Medical History:  Diagnosis Date  . Barrett esophagus    2018.  dr. Adria Devon said this has resolved after a recent endoscopy  . Barrett's esophagus July 2010   Wohl,   . Benign neoplasm of colon   . Bilateral renal cysts   . Colon polyps   . Cramp of limb   . Diverticulosis   . Generalized osteoarthritis   . Genital prolapse   . GERD (gastroesophageal reflux disease)   . H/O seasonal allergies   . Hallux valgus   . Hemorrhoids   . History of hiatal hernia   . Hyperlipidemia    intolerant of statins, normal diagnostic cath July 2010 [facility])  . Hyperlipidemia   . Left ovarian cyst   . Osteoporosis    by DEXA 2008  . S/P colonoscopy 2011   [name], polyp removed  . Stroke ([place]) 11/2015   TIA. no deficits    Assessment/Plan:   2 Days Post-Op Procedure(s) (LRB): COMPUTER ASSISTED TOTAL KNEE ARTHROPLASTY (Right) Active Problems:   S/P total knee arthroplasty  Estimated body mass index is 20.97 kg/m as calculated from the following:   Height as of this encounter: 5\' 1"  (1.549 m).   Weight as of this encounter: 50.3 kg (111 lb). Advance diet Up with therapy   The patient has had a bowel movement. Still working on bowel management. Pain is well controlled.  Vital signs stable.  Patient doing well this morning asymptomatic Discharge home with home health physical therapy today.   DVT Prophylaxis - Lovenox, Foot Pumps and TED hose Weight-Bearing as tolerated to right leg  Reche Dixon PA-C [place] 05/12/2017, 6:59 AM

## 2017-05-12 NOTE — Discharge Summary (Signed)
Physician Discharge Summary  Subjective: 2 Days Post-Op Procedure(s) (LRB): COMPUTER ASSISTED TOTAL KNEE ARTHROPLASTY (Right) Patient reports pain as 0 on 0-10 scale.   Patient seen in rounds with Dr. Marry Guan. Patient is well, and has had no acute complaints or problems Patient is ready to go home with home health physical therapy  Physician Discharge Summary  Patient ID: Karen Parsons MRN: [id number] DOB/AGE: January 14, 1941 76 y.o.  Admit date: 05/10/2017 Discharge date: 05/12/2017  Admission Diagnoses:  Discharge Diagnoses:  Active Problems:   S/P total knee arthroplasty   Discharged Condition: good  Hospital Course: The patient is postop day 2 from a right total knee replacement. She is doing very well since surgery. Her vitals are stable. The patient states that she had a bowel movement, but they are still doing bowel management. The patient ambulated 250 feet and is stable with ambulation.  Treatments: surgery:   Right total knee arthroplasty using computer-assisted navigation  SURGEON:  Marciano Sequin. M.D.  ASSISTANT:  Vance Peper, PA (present and scrubbed throughout the case, critical for assistance with exposure, retraction, instrumentation, and closure)  ANESTHESIA: spinal  ESTIMATED BLOOD LOSS: 25 mL  FLUIDS REPLACED: 1600 mL of crystalloid  TOURNIQUET TIME: 88 minutes  DRAINS: 2 medium Hemovac drains  SOFT TISSUE RELEASES: Anterior cruciate ligament, posterior cruciate ligament, deep medial collateral ligament, patellofemoral ligament, and posterolateral corner  IMPLANTS UTILIZED: DePuy Attune size 5N posterior stabilized femoral component (cemented), size 4 rotating platform tibial component (cemented), 35 mm medialized dome patella (cemented), and a 7 mm stabilized rotating platform polyethylene insert.    Discharge Exam: Blood pressure 113/71, pulse 65, temperature 98.5 F (36.9 C), temperature source Oral, resp. rate 16, height 5\' 1"  (1.549 m),  weight 50.3 kg (111 lb), SpO2 99 %.   Disposition: 01-Home or Self Care   Allergies as of 05/12/2017      Reactions   Sulfamethoxazole-trimethoprim Nausea And Vomiting, Other (See Comments)   "Shaking all over". (Bactrim)   Ezetimibe Other (See Comments)   Muscle Pain   Statins Other (See Comments)   Muscle Pain   Welchol [colesevelam] Other (See Comments)   Muscle Pain       Medication List    TAKE these medications   acetaminophen 500 MG tablet Commonly known as:  TYLENOL Take 500-1,000 mg by mouth daily as needed for moderate pain.   aspirin EC 81 MG tablet Take 81 mg by mouth daily.   celecoxib 200 MG capsule Commonly known as:  CELEBREX Take 1 capsule (200 mg total) by mouth every 12 (twelve) hours.   diclofenac sodium 1 % Gel Commonly known as:  VOLTAREN Apply 1 application topically daily as needed (arthritis pain).   docusate sodium 100 MG capsule Commonly known as:  COLACE Take 100 mg by mouth daily as needed for mild constipation.   enoxaparin 40 MG/0.4ML injection Commonly known as:  LOVENOX Inject 0.4 mLs (40 mg total) into the skin daily.   ipratropium 0.03 % nasal spray Commonly known as:  ATROVENT Place 1 spray into both nostrils daily as needed for rhinitis.   omeprazole 20 MG capsule Commonly known as:  PRILOSEC Take 20 mg by mouth 2 (two) times daily before a meal.   oxyCODONE 5 MG immediate release tablet Commonly known as:  Oxy IR/ROXICODONE Take 1 tablet (5 mg total) by mouth every 4 (four) hours as needed for moderate pain ((score 4 to 6)).   traMADol 50 MG tablet Commonly known as:  ULTRAM Take  1 tablet (50 mg total) by mouth every 12 (twelve) hours as needed for moderate pain.   Vitamin D 2000 units Caps Take 2,000 Units by mouth daily.            Durable Medical Equipment  (From admission, onward)        Start     Ordered   05/10/17 1719  DME Walker rolling  Once    Question:  Patient needs a walker to treat with the  following condition  Answer:  Total knee replacement status   05/10/17 1719   05/10/17 1719  DME Bedside commode  Once    Question:  Patient needs a bedside commode to treat with the following condition  Answer:  Total knee replacement status   05/10/17 1719     Follow-up Information    Watt Climes, PA Follow up on 05/25/2017.   Specialty:  Physician Assistant Why:  at 1:15pm Contact information: [place] [address] [phone]        Dereck Leep, MD Follow up on 06/24/2017.   Specialty:  Orthopedic Surgery Why:  at 2:15pm Contact information: [place] [address] [phone]           Signed: Prescott Parma, Marguita Venning 05/12/2017, 7:03 AM   Objective: Vital signs in last 24 hours: Temp:  [97.5 F (36.4 C)-98.5 F (36.9 C)] 98.5 F (36.9 C) (11/20 1954) Pulse Rate:  [51-67] 65 (11/20 1954) Resp:  [16-18] 16 (11/20 1954) BP: (71-151)/(48-87) 113/71 (11/20 1954) SpO2:  [96 %-100 %] 99 % (11/20 1954)  Intake/Output from previous day:  Intake/Output Summary (Last 24 hours) at 05/12/2017 0703 Last data filed at 05/11/2017 2031 Gross per 24 hour  Intake 820 ml  Output 50 ml  Net 770 ml    Intake/Output this shift: No intake/output data recorded.  Labs: No results for input(s): HGB in the last 72 hours. No results for input(s): WBC, RBC, HCT, PLT in the last 72 hours. No results for input(s): NA, K, CL, CO2, BUN, CREATININE, GLUCOSE, CALCIUM in the last 72 hours. No results for input(s): LABPT, INR in the last 72 hours.  EXAM: General - Patient is Alert and Oriented Extremity - Sensation intact distally Dorsiflexion/Plantar flexion intact No cellulitis present Compartment soft Incision - clean, dry, no drainage, with the Hemovac removed. Motor Function -  plantarflexion and dorsiflexion are intact. Able to do a straight leg raise independently.  Assessment/Plan: 2  Days Post-Op Procedure(s) (LRB): COMPUTER ASSISTED TOTAL KNEE ARTHROPLASTY (Right) Procedure(s) (LRB): COMPUTER ASSISTED TOTAL KNEE ARTHROPLASTY (Right) Past Medical History:  Diagnosis Date  . Barrett esophagus    2018.  dr. Adria Devon said this has resolved after a recent endoscopy  . Barrett's esophagus July 2010   Wohl,   . Benign neoplasm of colon   . Bilateral renal cysts   . Colon polyps   . Cramp of limb   . Diverticulosis   . Generalized osteoarthritis   . Genital prolapse   . GERD (gastroesophageal reflux disease)   . H/O seasonal allergies   . Hallux valgus   . Hemorrhoids   . History of hiatal hernia   . Hyperlipidemia    intolerant of statins, normal diagnostic cath July 2010 [facility])  . Hyperlipidemia   . Left ovarian cyst   . Osteoporosis    by DEXA 2008  . S/P colonoscopy 2011   [name], polyp removed  . Stroke [facility]) 11/2015  TIA. no deficits   Active Problems:   S/P total knee arthroplasty  Estimated body mass index is 20.97 kg/m as calculated from the following:   Height as of this encounter: 5\' 1"  (1.549 m).   Weight as of this encounter: 50.3 kg (111 lb). Up with therapy Discharge home with home health Diet - Regular diet Follow up - in 2 weeks Activity - WBAT Disposition - Home Condition Upon Discharge - Good DVT Prophylaxis - Lovenox and TED hose  Reche Dixon, PA-C Orthopaedic [facility] 05/12/2017, 7:03 AM

## 2017-05-12 NOTE — Care Management Note (Signed)
Case Management Note  Patient Details  Name: Karen Parsons MRN: [id number] Date of Birth: 03/12/1941  Subjective/Objective:  Discharging today                  Action/Plan: [facility] notified of discharge. Lovenox called in by provider. Cost is $19.46, patient updated.   Expected Discharge Date:  05/12/17               Expected Discharge Plan:  [place]  In-House Referral:     Discharge planning Services  CM Consult  Post Acute Care Choice:  Home Health Choice offered to:  Patient  DME Arranged:    DME Agency:     HH Arranged:  PT [place]:  [facility] (formerly [employer])  Status of Service:  Completed, signed off  If discussed at [employer], dates discussed:    Additional Comments:  Jolly Mango, RN 05/12/2017, 9:17 AM

## 2017-05-12 NOTE — Progress Notes (Signed)
Discharge summary reviewed with verbal understanding. Answered all questions. 2 narcotic Rxs given upon discharge. Changed dressing per order, one sent. Belongings packed. Escorted to personal vehicle via staff

## 2017-05-12 NOTE — Progress Notes (Signed)
Physical Therapy Treatment Patient Details Name: Karen Parsons MRN: [id number] DOB: 1940-12-21 Today's Date: 05/12/2017    History of Present Illness Pt admitted for R TKR.    PT Comments    Karen Parsons made excellent progress with mobility.  She complete stair training with boyfriend and daughter in law present.  She ambulated 260 ft with RW and R knee AROM is -1 to 92 deg.     Follow Up Recommendations  Home health PT     Equipment Recommendations  None recommended by PT    Recommendations for Other Services       Precautions / Restrictions Precautions Precautions: Fall;Knee Precaution Booklet Issued: Yes (comment) Precaution Comments: Reviewed no pillow under knee Restrictions Weight Bearing Restrictions: Yes RLE Weight Bearing: Weight bearing as tolerated    Mobility  Bed Mobility Overal bed mobility: Independent Bed Mobility: Supine to Sit     Supine to sit: Independent     General bed mobility comments: Pt performs independently without assist or cues.    Transfers Overall transfer level: Needs assistance Equipment used: Rolling walker (2 wheeled) Transfers: Sit to/from Stand Sit to Stand: Supervision         General transfer comment: Supervision for safety.  No signs of instability.  Cues on one occasion for sit>stand for proper hand placement.   Ambulation/Gait Ambulation/Gait assistance: Supervision Ambulation Distance (Feet): 260 Feet Assistive device: Rolling walker (2 wheeled) Gait Pattern/deviations: Step-through pattern;Decreased stride length;Decreased weight shift to right;Decreased stance time - right;Decreased step length - left;Antalgic Gait velocity: slightly decreased Gait velocity interpretation: Below normal speed for age/gender General Gait Details: Supervision for safety.  Pt with slightly decreased gait speed but steady.     Stairs Stairs: Yes   Stair Management: No rails;Two rails;Step to pattern;Forwards Number of Stairs:  4(x2) General stair comments: Cues for proper technique.  Provided handheld assist for first step up and all 4 steps during descent.  On 2nd-4th steps up the pt used Bil rails as pt has grab bars that she can reach once on the second step at home.  Boyfriend and daughter in law present.   Wheelchair Mobility    Modified Rankin (Stroke Patients Only)       Balance Overall balance assessment: Needs assistance Sitting-balance support: No upper extremity supported;Feet supported Sitting balance-Leahy Scale: Good     Standing balance support: No upper extremity supported;During functional activity Standing balance-Leahy Scale: Fair Standing balance comment: Pt able to stand statically without UE support but requires UE support for dynamic activities                            Cognition Arousal/Alertness: Awake/alert Behavior During Therapy: WFL for tasks assessed/performed Overall Cognitive Status: Within Functional Limits for tasks assessed                                        Exercises Total Joint Exercises Quad Sets: Strengthening;Both;10 reps;Supine Straight Leg Raises: Strengthening;Right;10 reps;Seated Long Arc Quad: Strengthening;Right;15 reps;Seated Knee Flexion: AAROM;Right;10 reps;Seated Goniometric ROM: -1 to 92 deg    General Comments        Pertinent Vitals/Pain Pain Assessment: Faces Pain Score: 1  Faces Pain Scale: Hurts little more Pain Location: R knee Pain Descriptors / Indicators: Operative site guarding;Grimacing Pain Intervention(s): Limited activity within patient's tolerance;Monitored during session;Repositioned;Ice applied    Home Living  Prior Function            PT Goals (current goals can now be found in the care plan section) Acute Rehab PT Goals Patient Stated Goal: to go home today PT Goal Formulation: With patient Time For Goal Achievement: 05/25/17 Potential to Achieve  Goals: Good Progress towards PT goals: Progressing toward goals    Frequency    BID      PT Plan Current plan remains appropriate    Co-evaluation              AM-PAC PT "6 Clicks" Daily Activity  Outcome Measure  Difficulty turning over in bed (including adjusting bedclothes, sheets and blankets)?: None Difficulty moving from lying on back to sitting on the side of the bed? : None Difficulty sitting down on and standing up from a chair with arms (e.g., wheelchair, bedside commode, etc,.)?: A Little Help needed moving to and from a bed to chair (including a wheelchair)?: A Little Help needed walking in hospital room?: A Little Help needed climbing 3-5 steps with a railing? : A Little 6 Click Score: 20    End of Session Equipment Utilized During Treatment: Gait belt Activity Tolerance: Patient tolerated treatment well Patient left: in chair;with call bell/phone within reach;with chair alarm set;with family/visitor present;Other (comment)(towel roll and polar care) Nurse Communication: Mobility status PT Visit Diagnosis: Muscle weakness (generalized) (M62.81);Difficulty in walking, not elsewhere classified (R26.2);Pain Pain - Right/Left: Right Pain - part of body: Knee     Time: 5176-1607 PT Time Calculation (min) (ACUTE ONLY): 30 min  Charges:  $Gait Training: 23-37 mins                    G Codes:       Karen Parsons PT, DPT 05/12/2017, 11:58 AM

## 2017-05-12 NOTE — Progress Notes (Signed)
Occupational Therapy Treatment Patient Details Name: Karen Parsons MRN: [id number] DOB: 07/11/1940 Today's Date: 05/12/2017    History of present illness Pt admitted for R TKR.   OT comments    Follow Up Recommendations  No OT follow up    Equipment Recommendations  None recommended by OT    Recommendations for Other Services    Pt seen to review home set up and safety to prevent falls.  Rec using a wide step stool with assist from husband to get into and out of bed since it is high.  Reviewed tech to don and doff ted hose and set up in bathroom and adaptive equip catalog for pt to take home.  Pt has met all goals and is ready for DC home with husband with assist as needed and no further OT rec.      Precautions / Restrictions Precautions Precautions: Fall;Knee Restrictions Weight Bearing Restrictions: Yes RLE Weight Bearing: Weight bearing as tolerated       Mobility Bed Mobility                  Transfers                      Balance                                           ADL either performed or assessed with clinical judgement   ADL Overall ADL's : Needs assistance/impaired                                       General ADL Comments: Pt seen to review home set up and safety to prevent falls.  Rec using a wide step stool with assist from husband to get into and out of bed since it is high.  Reviewed tech to don and doff ted hose and set up in bathroom.  Pt has met all goals and is ready for DC home with husband with assist as needed and no further OT rec.      Vision       Perception     Praxis      Cognition Arousal/Alertness: Awake/alert Behavior During Therapy: WFL for tasks assessed/performed Overall Cognitive Status: Within Functional Limits for tasks assessed                                          Exercises     Shoulder Instructions       General Comments      Pertinent  Vitals/ Pain       Pain Assessment: 0-10 Pain Score: 1  Pain Location: R knee Pain Descriptors / Indicators: Aching Pain Intervention(s): Monitored during session;Premedicated before session  Home Living                                          Prior Functioning/Environment              Frequency           Progress Toward Goals  OT Goals(current goals can now be  found in the care plan section)  Progress towards OT goals: Goals met/education completed, patient discharged from [facility] Discharge plan remains appropriate    Co-evaluation                 AM-PAC PT "6 Clicks" Daily Activity     Outcome Measure   Help from another person eating meals?: None Help from another person taking care of personal grooming?: None Help from another person toileting, which includes using toliet, bedpan, or urinal?: A Little Help from another person bathing (including washing, rinsing, drying)?: A Little Help from another person to put on and taking off regular upper body clothing?: None Help from another person to put on and taking off regular lower body clothing?: A Little 6 Click Score: 21    End of Session Equipment Utilized During Treatment: Gait belt  OT Visit Diagnosis: Other abnormalities of gait and mobility (R26.89)   Activity Tolerance Patient tolerated treatment well   Patient Left in chair;with call bell/phone within reach;with family/visitor present   Nurse Communication      Functional Assessment Tool Used: AM-PAC 6 Clicks Daily Activity;Clinical judgement Functional Limitation: Self care Self Care Current Status (G5271): At least 20 percent but less than 40 percent impaired, limited or restricted Self Care Goal Status (S9290): At least 1 percent but less than 20 percent impaired, limited or restricted Self Care Discharge Status [phone]): At least 1 percent but less than 20 percent impaired, limited or restricted   Time:  1030-1055 OT Time Calculation (min): 25 min  Charges: OT G-codes **NOT FOR INPATIENT CLASS** Functional Assessment Tool Used: AM-PAC 6 Clicks Daily Activity;Clinical judgement Functional Limitation: Self care Self Care Current Status (O9969): At least 20 percent but less than 40 percent impaired, limited or restricted Self Care Goal Status (G4932): At least 1 percent but less than 20 percent impaired, limited or restricted Self Care Discharge Status [phone]): At least 1 percent but less than 20 percent impaired, limited or restricted OT General Charges $OT Visit: 1 Visit OT Treatments $Self Care/Home Management : 23-37 mins  Chrys Racer, OTR/L ascom [phone] 05/12/17, 11:11 AM

## 2017-07-26 DIAGNOSIS — Z961 Presence of intraocular lens: Secondary | ICD-10-CM | POA: Insufficient documentation

## 2017-10-12 ENCOUNTER — Other Ambulatory Visit: Payer: Self-pay | Admitting: Internal Medicine

## 2017-10-12 DIAGNOSIS — Z1231 Encounter for screening mammogram for malignant neoplasm of breast: Secondary | ICD-10-CM

## 2017-10-27 ENCOUNTER — Ambulatory Visit
Admission: RE | Admit: 2017-10-27 | Discharge: 2017-10-27 | Disposition: A | Discharge status: Home / Self Care | Payer: Medicare Other | Source: Ambulatory Visit | Attending: Internal Medicine | Admitting: Internal Medicine

## 2017-10-27 DIAGNOSIS — Z1231 Encounter for screening mammogram for malignant neoplasm of breast: Secondary | ICD-10-CM | POA: Insufficient documentation

## 2018-03-07 ENCOUNTER — Encounter: Payer: Self-pay | Admitting: Podiatry

## 2018-03-07 ENCOUNTER — Ambulatory Visit (INDEPENDENT_AMBULATORY_CARE_PROVIDER_SITE_OTHER): Payer: Medicare Other

## 2018-03-07 ENCOUNTER — Ambulatory Visit (INDEPENDENT_AMBULATORY_CARE_PROVIDER_SITE_OTHER): Payer: Medicare Other | Admitting: Podiatry

## 2018-03-07 DIAGNOSIS — Q828 Other specified congenital malformations of skin: Secondary | ICD-10-CM

## 2018-03-07 DIAGNOSIS — M2042 Other hammer toe(s) (acquired), left foot: Secondary | ICD-10-CM

## 2018-03-07 DIAGNOSIS — M2012 Hallux valgus (acquired), left foot: Secondary | ICD-10-CM

## 2018-03-07 DIAGNOSIS — M2041 Other hammer toe(s) (acquired), right foot: Secondary | ICD-10-CM

## 2018-03-07 DIAGNOSIS — L603 Nail dystrophy: Secondary | ICD-10-CM

## 2018-03-07 NOTE — Progress Notes (Signed)
She presents today concerned about brittle Fike toenails also medial deviation of her second toes bilaterally.  She states that the toenail seem to be getting thinner and more easily broken.  Objective: Vital signs are stable alert and oriented x3.  Pulses are palpable.  Neurologic sensorium is intact.  Previous surgeries hallux valgus deformity second metatarsal osteotomy third metatarsal osteotomy fourth metatarsal osteotomy on the right foot his left heel deviation of the second and the lesser toes of the right foot prominent as well as on the left foot similarly.  She has a reactive hyperkeratosis to the medial aspect of the third digit of the right foot exquisitely tender no erythema cellulitis drainage or odor no open lesions or wounds.  Toenails appear to be brittle from mechanical debridement.  Does not appear to have any subungual onychomycosis at this time though there is some distal Onikul lysis.  Assessment: Digital deformities bilaterally with reactive hyperkeratosis painful third toe right.  Plan: Discussed etiology pathology conservative therapies demonstrating padding and provided her with padding today to help alleviate symptoms.  Briefly discussed surgical intervention.

## 2019-04-07 ENCOUNTER — Other Ambulatory Visit: Payer: Self-pay

## 2019-04-07 DIAGNOSIS — Z20822 Contact with and (suspected) exposure to covid-19: Secondary | ICD-10-CM

## 2019-04-09 LAB — NOVEL CORONAVIRUS, NAA: SARS-CoV-2, NAA: NOT DETECTED

## 2019-04-16 DIAGNOSIS — M1712 Unilateral primary osteoarthritis, left knee: Secondary | ICD-10-CM | POA: Insufficient documentation

## 2019-05-23 ENCOUNTER — Other Ambulatory Visit: Payer: Self-pay | Admitting: Internal Medicine

## 2019-05-23 DIAGNOSIS — R42 Dizziness and giddiness: Secondary | ICD-10-CM

## 2019-05-23 DIAGNOSIS — R413 Other amnesia: Secondary | ICD-10-CM

## 2019-05-23 DIAGNOSIS — R55 Syncope and collapse: Secondary | ICD-10-CM

## 2019-05-24 DIAGNOSIS — E538 Deficiency of other specified B group vitamins: Secondary | ICD-10-CM | POA: Insufficient documentation

## 2019-06-05 ENCOUNTER — Ambulatory Visit
Admission: RE | Admit: 2019-06-05 | Discharge: 2019-06-05 | Disposition: A | Discharge status: Home / Self Care | Payer: Medicare Other | Source: Ambulatory Visit | Attending: Internal Medicine | Admitting: Internal Medicine

## 2019-06-05 ENCOUNTER — Other Ambulatory Visit: Payer: Self-pay

## 2019-06-05 DIAGNOSIS — R413 Other amnesia: Secondary | ICD-10-CM | POA: Diagnosis present

## 2019-06-05 DIAGNOSIS — R42 Dizziness and giddiness: Secondary | ICD-10-CM | POA: Diagnosis not present

## 2019-06-05 DIAGNOSIS — R55 Syncope and collapse: Secondary | ICD-10-CM | POA: Diagnosis present

## 2019-08-02 ENCOUNTER — Ambulatory Visit: Payer: Medicare Other

## 2019-08-02 ENCOUNTER — Ambulatory Visit (INDEPENDENT_AMBULATORY_CARE_PROVIDER_SITE_OTHER): Payer: Medicare Other | Admitting: Podiatry

## 2019-08-02 ENCOUNTER — Other Ambulatory Visit: Payer: Self-pay

## 2019-08-02 DIAGNOSIS — Q828 Other specified congenital malformations of skin: Secondary | ICD-10-CM | POA: Diagnosis not present

## 2019-08-02 DIAGNOSIS — M778 Other enthesopathies, not elsewhere classified: Secondary | ICD-10-CM

## 2019-08-02 NOTE — Progress Notes (Signed)
She presents today with painful corns between her first and second and second and third toes of the right foot for about a year or so.  States that they sting and burn and she has tried certain sleeves really nothing seems to help.  Objective: Vital signs are stable she is alert and oriented x3.  Pulses are palpable.  Neurologic sensorium is intact deep tendon reflexes are intact.  Mild lateral deviation of the toe secondary to hallux valgus deformity is resulting in juxtaposition of the first and the second lateral to medial as well as the 2nd-3rd lateral to medial which is resulting in reactive hyperkeratosis no complete skin breakdown no ulcerations.  No signs of infection.  Assessment: Reactive hyperkeratotic tissue to toes secondary to reactive bone growth and hallux valgus deformity.  Plan: Debridement of all reactive hyperkeratotic tissue.  Placed pads.  Follow-up with her as needed.

## 2019-09-08 NOTE — Discharge Instructions (Signed)
Instructions after Total Knee Replacement   Hadas Jessop P. Sol Odor, [name], M.D.     Dept. of Orthopaedics & Sports Medicine  [facility]  [address]  [place], [place]  [address]  Phone: [phone]   Fax: [phone]    DIET: Drink plenty of non-alcoholic fluids. Resume your normal diet. Include foods high in fiber.  ACTIVITY:  You may use crutches or a walker with weight-bearing as tolerated, unless instructed otherwise. You may be weaned off of the walker or crutches by your Physical Therapist.  Do NOT place pillows under the knee. Anything placed under the knee could limit your ability to straighten the knee.   Continue doing gentle exercises. Exercising will reduce the pain and swelling, increase motion, and prevent muscle weakness.   Please continue to use the TED compression stockings for 6 weeks. You may remove the stockings at night, but should reapply them in the morning. Do not drive or operate any equipment until instructed.  WOUND CARE:  Continue to use the PolarCare or ice packs periodically to reduce pain and swelling. You may bathe or shower after the staples are removed at the first office visit following surgery.  MEDICATIONS: You may resume your regular medications. Please take the pain medication as prescribed on the medication. Do not take pain medication on an empty stomach. You have been given a prescription for a blood thinner (Lovenox or Coumadin). Please take the medication as instructed. (NOTE: After completing a 2 week course of Lovenox, take one Enteric-coated aspirin once a day. This along with elevation will help reduce the possibility of phlebitis in your operated leg.) Do not drive or drink alcoholic beverages when taking pain medications.  CALL THE OFFICE FOR: Temperature above 101 degrees Excessive bleeding or drainage on the dressing. Excessive swelling, coldness, or paleness of the toes. Persistent nausea and vomiting.  FOLLOW-UP:  You  should have an appointment to return to the office in 10-14 days after surgery. Arrangements have been made for continuation of Physical Therapy (either home therapy or outpatient therapy).   [facility] Department Directory         [email or web address]       [email or web address]          Cardiology  Appointments: [place] - [phone] [place] - [phone]  Endocrinology  Appointments: [place] - [phone] [place] - [phone]  Gastroenterology  Appointments: [place] - [phone] [place] - [phone]        General Surgery   Appointments: [place] - [phone]  Internal Medicine/Family Medicine  Appointments: [place] - [phone] [place] - [phone] [place] - [phone]  Metabolic and Weigh Loss Surgery  Appointments: [place] - [phone]        Neurology  Appointments: [place] - [phone] [place] - [phone]  Neurosurgery  Appointments: [place] - [phone]  Obstetrics & Gynecology  Appointments: [place] - [phone] [place] - [phone]        Pediatrics  Appointments: [place] - [phone] [place] - [phone]  Physiatry  Appointments: [place] -[phone]  Physical Therapy  Appointments: [place] - [phone] [place] - [phone]        Podiatry  Appointments: [place] - [phone] [place] - [phone]  Pulmonology  Appointments: [place] - [phone]  Rheumatology  Appointments: [place] - [phone]        [place] Location: [facility]  [address] [place], [place]  [address]  [place] Location: [facility] [address] [place], [place]  [address]  [place] Location: [facility] [address] [place], [place]  [address]    

## 2019-09-14 ENCOUNTER — Encounter
Admission: RE | Admit: 2019-09-14 | Discharge: 2019-09-14 | Disposition: A | Discharge status: Home / Self Care | Payer: Medicare Other | Source: Ambulatory Visit | Attending: Orthopedic Surgery | Admitting: Orthopedic Surgery

## 2019-09-14 ENCOUNTER — Other Ambulatory Visit: Payer: Self-pay

## 2019-09-14 DIAGNOSIS — Z01818 Encounter for other preprocedural examination: Secondary | ICD-10-CM | POA: Diagnosis not present

## 2019-09-14 LAB — TYPE AND SCREEN
ABO/RH(D): O POS
Antibody Screen: NEGATIVE

## 2019-09-14 LAB — URINALYSIS, ROUTINE W REFLEX MICROSCOPIC
Bilirubin Urine: NEGATIVE
Glucose, UA: NEGATIVE mg/dL
Hgb urine dipstick: NEGATIVE
Ketones, ur: 15 mg/dL — AB
Leukocytes,Ua: NEGATIVE
Nitrite: NEGATIVE
Protein, ur: NEGATIVE mg/dL
Specific Gravity, Urine: 1.025 (ref 1.005–1.030)
pH: 5 (ref 5.0–8.0)

## 2019-09-14 LAB — COMPREHENSIVE METABOLIC PANEL
ALT: 21 U/L (ref 0–44)
AST: 24 U/L (ref 15–41)
Albumin: 4.2 g/dL (ref 3.5–5.0)
Alkaline Phosphatase: 76 U/L (ref 38–126)
Anion gap: 10 (ref 5–15)
BUN: 28 mg/dL — ABNORMAL HIGH (ref 8–23)
CO2: 27 mmol/L (ref 22–32)
Calcium: 9.4 mg/dL (ref 8.9–10.3)
Chloride: 103 mmol/L (ref 98–111)
Creatinine, Ser: 0.84 mg/dL (ref 0.44–1.00)
GFR calc Af Amer: >60 mL/min (ref >60)
GFR calc non Af Amer: >60 mL/min (ref >60)
Glucose, Bld: 91 mg/dL (ref 70–99)
Potassium: 4.1 mmol/L (ref 3.5–5.1)
Sodium: 140 mmol/L (ref 135–145)
Total Bilirubin: 0.9 mg/dL (ref 0.3–1.2)
Total Protein: 7 g/dL (ref 6.5–8.1)

## 2019-09-14 LAB — SURGICAL PCR SCREEN
MRSA, PCR: NEGATIVE
Staphylococcus aureus: NEGATIVE

## 2019-09-14 LAB — APTT: aPTT: 28 seconds (ref 24–36)

## 2019-09-14 LAB — CBC
HCT: 41.2 % (ref 36.0–46.0)
Hemoglobin: 13.2 g/dL (ref 12.0–15.0)
MCH: 30.9 pg (ref 26.0–34.0)
MCHC: 32 g/dL (ref 30.0–36.0)
MCV: 96.5 fL (ref 80.0–100.0)
Platelets: 221 10*3/uL (ref 150–400)
RBC: 4.27 MIL/uL (ref 3.87–5.11)
RDW: 14.3 % (ref 11.5–15.5)
WBC: 5.4 10*3/uL (ref 4.0–10.5)
nRBC: 0 % (ref 0.0–0.2)

## 2019-09-14 LAB — SEDIMENTATION RATE: Sed Rate: 5 mm/hr (ref 0–30)

## 2019-09-14 LAB — C-REACTIVE PROTEIN: CRP: 0.5 mg/dL (ref <1.0)

## 2019-09-14 LAB — PROTIME-INR
INR: 1 (ref 0.8–1.2)
Prothrombin Time: 12.8 seconds (ref 11.4–15.2)

## 2019-09-14 NOTE — Patient Instructions (Signed)
Your procedure is scheduled on: 09/25/19 Report to [facility]. To find out your arrival time please call [phone] between 1PM - 3PM on 09/22/19.  Remember: Instructions that are not followed completely may result in serious medical risk, up to and including death, or upon the discretion of your surgeon and anesthesiologist your surgery may need to be rescheduled.     _X__ 1. Do not eat food after midnight the night before your procedure.                 No gum chewing or hard candies. You may drink clear liquids up to 2 hours                 before you are scheduled to arrive for your surgery- DO not drink clear                 liquids within 2 hours of the start of your surgery.                 Clear Liquids include:  water, apple juice without pulp, clear carbohydrate                 drink such as Clearfast or Gatorade, Black Coffee or Tea (Do not add                 anything to coffee or tea). Diabetics water only  __X__2.  On the morning of surgery brush your teeth with toothpaste and water, you                 may rinse your mouth with mouthwash if you wish.  Do not swallow any              toothpaste of mouthwash.     _X__ 3.  No Alcohol for 24 hours before or after surgery.   _X__ 4.  Do Not Smoke or use e-cigarettes For 24 Hours Prior to Your Surgery.                 Do not use any chewable tobacco products for at least 6 hours prior to                 surgery.  ____  5.  Bring all medications with you on the day of surgery if instructed.   __X__  6.  Notify your doctor if there is any change in your medical condition      (cold, fever, infections).     Do not wear jewelry, make-up, hairpins, clips or nail polish. Do not wear lotions, powders, or perfumes.  Do not shave 48 hours prior to surgery. Men may shave face and neck. Do not bring valuables to the hospital.    [facility] is not responsible for any belongings or  valuables.  Contacts, dentures/partials or body piercings may not be worn into surgery. Bring a case for your contacts, glasses or hearing aids, a denture cup will be supplied. Leave your suitcase in the car. After surgery it may be brought to your room. For patients admitted to the hospital, discharge time is determined by your treatment team.   Patients discharged the day of surgery will not be allowed to drive home.   Please read over the following fact sheets that you were given:   MRSA Information  __X__ Take these medicines the morning of surgery with A SIP OF WATER:  1. memantine (NAMENDA) 5 MG tablet  2. omeprazole (PRILOSEC) 20 MG capsule  3.   4.  5.  6.  ____ Fleet Enema (as directed)   __X__ Use CHG Soap/SAGE wipes as directed  __X__ Use inhalers on the day of surgery  ____ Stop metformin/Janumet/Farxiga 2 days prior to surgery    ____ Take 1/2 of usual insulin dose the night before surgery. No insulin the morning          of surgery.   ____ Stop Blood Thinners Coumadin/Plavix/Xarelto/Pleta/Pradaxa/Eliquis/Effient/Aspirin  on   Or contact your Surgeon, Cardiologist or Medical Doctor regarding  ability to stop your blood thinners  __X__ Stop Anti-inflammatories 7 days before surgery such as Advil, Ibuprofen, Motrin,  BC or Goodies Powder, Naprosyn, Naproxen, Aleve   __X__ Stop all herbal supplements, fish oil or vitamin E until after surgery.    ____ Bring C-Pap to the hospital.   You can continue your Vit D and B12, do not take them the day of surgery.  The Ensure pre surgery dring should be completed 2 hours prior to your arrival the day of surgery.  Review the [employer] instructions

## 2019-09-15 LAB — URINE CULTURE
Culture: NO GROWTH
Special Requests: NORMAL

## 2019-09-21 ENCOUNTER — Other Ambulatory Visit: Payer: Self-pay

## 2019-09-21 ENCOUNTER — Other Ambulatory Visit
Admission: RE | Admit: 2019-09-21 | Discharge: 2019-09-21 | Disposition: A | Discharge status: Home / Self Care | Payer: Medicare Other | Source: Ambulatory Visit | Attending: Orthopedic Surgery | Admitting: Orthopedic Surgery

## 2019-09-21 DIAGNOSIS — Z01812 Encounter for preprocedural laboratory examination: Secondary | ICD-10-CM | POA: Insufficient documentation

## 2019-09-21 DIAGNOSIS — Z20822 Contact with and (suspected) exposure to covid-19: Secondary | ICD-10-CM | POA: Insufficient documentation

## 2019-09-21 LAB — SARS CORONAVIRUS 2 (TAT 6-24 HRS): SARS Coronavirus 2: NEGATIVE

## 2019-09-24 ENCOUNTER — Encounter: Payer: Self-pay | Admitting: Orthopedic Surgery

## 2019-09-24 NOTE — H&P (Signed)
ORTHOPAEDIC HISTORY & PHYSICAL Gwenlyn Fudge, [place] - 09/15/2019 10:45 AM EDT Formatting of this note might be different from the original. [place] MEDICINE Chief Complaint:   Chief Complaint  Patient presents with  . Follow-up  H&P--LT TKA ON 09/25/19--JPH   History of Present Illness:   Karen Parsons is a 79 y.o. female that presents to clinic today for her preoperative history and evaluation. Patient presents with her neighbor.The patient is scheduled to undergo a left total knee arthroplasty on 09/25/19 by Dr. Marry Guan. Her pain began approximately 1 year ago. The pain is located primarily along the medial aspect of the knee. She reports associated swelling with some giving way of the knee. Pain is aggravated by weightbearing. Patient denies any locking of the knee. She denies associated numbness or tingling.   The patient's symptoms have progressed to the point that they decrease her quality of life. The patient has previously undergone conservative treatment including topical NSAIDS and injections to the knee without adequate control of her symptoms.  Patient denies history of blood clots, significant cardiac problems, or history of back surgery.  Past Medical, Surgical, Family, Social History, Allergies, Medications:   Past Medical History:  Past Medical History:  Diagnosis Date  . Barrett esophagus 12/09/2012  Indefinite Dysplasia  . Benign essential hypertension  . Bilateral renal cysts 08/01/2015  Incidentally noted on CT scan 1/17  . Combined forms of age-related cataract of right eye  . Generalized osteoarthritis 08/01/2015  Status post arthroscopy of both knees. Prior MRI with lumbar degenerative disc disease and moderate spinal stenosis with epidural steroid injection providing good relief.  Marland Kitchen GERD (gastroesophageal reflux disease)  . Hallux valgus (acquired)  . Hemorrhoids  . Hiatal hernia  . Intermediate coronary syndrome (CMS-HCC)   . Left ovarian cyst 08/01/2015  Incidentally noted on CT scan 1/17; gastroenterology set up ultrasound in 6 months  . Osteoporosis  . Other and unspecified hyperlipidemia  . Personal history of colonic polyps  Adenomatous Polyp  . Stroke (CMS-HCC)  . Transient cerebral ischemic attack 12/18/2015  Hospitalized 6/17. Echo with normal LVEF without sequelae valvular heart disease. CT angiogram of the neck and brain with minimal atherosclerotic disease. High-grade stenosis of innominate vein, with collateral filling   Past Surgical History:  Past Surgical History:  Procedure Laterality Date  . APPENDECTOMY 1959  . Cardiac Catheterization 2011  . CATARACT EXTRACTION  . COLONOSCOPY 05/20/2010  Adenomatous Polyp: CBF 04/2015  . COLONOSCOPY 01/07/2015  Adenomatous Polyps: CBF 12/2019  . COLPOPEXY FOR SUSPENSION UTEROSACRUM INTRAPERITONEAL N/A 05/11/2016  Procedure: COLPOPEXY, VAGINAL; INTRA-PERITONEAL APPROACH (UTEROSACRAL, LEVATOR MYORRHAPHY); Surgeon: Larwance Rote, MD; Location: [facility]; Service: Gynecology; Laterality: N/A;  . COLPORRHAPHY FOR REPAIR CYSTOCELE ANTERIOR N/A 05/11/2016  Procedure: ANTERIOR COLPORRHAPHY, REPAIR OF CYSTOCELE WITH OR WITHOUT REPAIR OF URETHROCELE; Surgeon: Larwance Rote, MD; Location: [facility]; Service: Gynecology; Laterality: N/A;  . CYSTOURETHROSCOPY N/A 05/11/2016  Procedure: CYSTOURETHROSCOPY; Surgeon: Larwance Rote, MD; Location: [facility] OR; Service: Gynecology; Laterality: N/A;  . EGD 12/09/2012, 04/05/2012, 01/31/2009  Barrett's Esophagus: CBF 11/2014; Recall Ltr mailed 09/26/2014 (dw); OV made 11/23/2014 @ 11am w/Kim Jerelene Redden NP (dw)  . EGD 01/07/2015  Barrett's Esophagus w/low grade dysplasia: CBF 04/2015 (4-6 months)  . EGD 05/23/2015  Barrett's Esophagus w/low-grade dysplasia: Needs Ablation Procedure per RTE; Sch'ed 07/11/2015 w/PYO  . EGD 09/15/2016 [facility])  Treated w/Argon Plasma Coagulation: CBF 10/2016 w/[facility]  . EGD with BARRX 07/11/2015  Barrett's  treated with BARRX/GERD/Repeat 2 months  with Barrx/PYO  . [facility]GD with [facility] 09/12/2015  Barrett's treated with BARRX/GERD/Repeat 2 months with Barrx/PYO  . EGD with BARRX 11/14/2015  Barrett's treated with BARRX/GERD/Repeat EGD in 6 months with Biopsies/RTE  . EXTRACTION CATARACT EXTRACAPSULAR W/INSERTION INTRAOCULAR PROSTHESIS Right 07/26/2017  Procedure: R - LenSx /Toric/ ORA-------Cataract extraction with intraocular lens implant; Surgeon: Arline Asp, MD; Location: [place]; Service: Ophthalmology; Laterality: Right;  . HEMORRHOIDECTOMY BY SIMPLE LIGATION 10/02/2013  . HYSTERECTOMY 1978  TAH still has ovaries  . JOINT REPLACEMENT  . KNEE ARTHROSCOPY 2010 /2015  Both knees  . LENS EYE SURGERY Left  pciol  . Meniscus repair  . Right foot/toe reconstructive surgery  . Right knee arthroscopy, partial lateral meniscectomy and chondroplasty of the lateral femoral condyle Right 12/27/13  . Right total knee arthroplasty using computer-assisted navigation 05/10/2017  Dr Marry Guan  . TONSILLECTOMY 1973  . TONSILLECTOMY & ADENOIDECTOMY 1961   Current Medications:  Current Outpatient Medications  Medication Sig Dispense Refill  . acetaminophen (TYLENOL) 650 MG ER tablet Take 1,300 mg by mouth As needed  . amoxicillin (AMOXIL) 500 MG capsule Take by mouth  . aspirin 81 MG EC tablet Take 81 mg by mouth once daily  . cholecalciferol (CHOLECALCIFEROL) 1,000 unit tablet Take 1,000 Units by mouth once daily  . cyanocobalamin (VITAMIN B12) 1000 MCG tablet Take by mouth  . diclofenac (VOLTAREN) 1 % topical gel APPLY TOPICALLY THREE TIMES A DAY 300 g 13  . ipratropium (ATROVENT) 0.03 % nasal spray Place 2 sprays into both nostrils 2 (two) times daily 90 mL 3  . melatonin-pyridoxine, vit B6, (MELATONIN, WITH B6,) 5-1 mg Tab Take by mouth  . memantine (NAMENDA) 5 MG tablet Take 1 tablet (5 mg total) by mouth 2 (two) times daily for 90 days 180 tablet 3  . omeprazole (PRILOSEC) 20 MG DR capsule Take  1 capsule (20 mg total) by mouth 2 (two) times daily 180 capsule 3  . rosuvastatin (CRESTOR) 5 MG tablet Take 1 tablet (5 mg total) by mouth once a week 90 tablet 4  . traMADoL (ULTRAM) 50 mg tablet Take 50 mg by mouth every 6 (six) hours as needed for Pain   No current facility-administered medications for this visit.   Allergies:  Allergies  Allergen Reactions  . Bactrim [Sulfamethoxazole-Trimethoprim] Nausea  "Shaking all over"  . Lipitor [Atorvastatin] Muscle Pain  . Welchol [Colesevelam] Muscle Pain  . Zetia [Ezetimibe] Muscle Pain   Social History:  Social History   Socioeconomic History  . Marital status: Widowed  Spouse name: Not on file  . Number of children: 2  . Years of education: 27  . Highest education level: Not on file  Occupational History  . Occupation: Retired  . Occupation: Psychologist, occupational and weekend [employer]  Social Needs  . Financial resource strain: Not on file  . Food insecurity  Worry: Not on file  Inability: Not on file  . Transportation needs  Medical: Not on file  Non-medical: Not on file  Tobacco Use  . Smoking status: Former Smoker  Packs/day: 1.00  Years: 10.00  Pack years: 10.00  Types: Cigarettes  Start date: 01/26/1957  Quit date: 1968  Years since quitting: 52.6  . Smokeless tobacco: Never Used  Substance and Sexual Activity  . Alcohol use: Yes  Alcohol/week: 1.0 standard drinks  Types: 1 Glasses of wine per week  Comment: 4 oz wine per day  . Drug use: No  . Sexual activity: Not Currently  Partners: Male  Birth control/protection: Surgical, Post-menopausal  Lifestyle  . Physical activity  Days per week: Not on file  Minutes per session: Not on file  . Stress: Not on file  Relationships  . Social Medical illustrator on phone: Not on file  Gets together: Not on file  Attends religious service: Not on file  Active member of club or organization: Not on file  Attends meetings of clubs or organizations: Not on file   Relationship status: Not on file  Other Topics Concern  . Not on file  Social History Narrative  . Not on file   Family History:  Family History  Problem Relation Age of Onset  . High blood pressure (Hypertension) Mother  . Myocardial Infarction (Heart attack) Father 65  COD  . Lung cancer Sister  COD  . Heart disease Maternal Grandmother  . Prostate cancer Maternal Grandfather  . No Known Problems Paternal Grandmother  . No Known Problems Paternal Grandfather  . Heart disease Brother  . No Known Problems Son  . No Known Problems Son  . Cancer Sister  died from lung cancer  . Anesthesia problems Neg Hx  . Malignant hyperthermia Neg Hx  . Glaucoma Neg Hx  . Macular degeneration Neg Hx  . Cataracts Neg Hx   Review of Systems:   A 10+ ROS was performed, reviewed, and the pertinent orthopaedic findings are documented in the HPI.   Physical Examination:   BP 132/87 (BP Location: Left upper arm, Patient Position: Sitting, BP Cuff Size: Adult)  Ht 157.5 cm (5\' 2" )  Wt 53.1 kg (117 lb)  BMI 21.40 kg/m   Patient is a well-developed, well-nourished female in no acute distress. Patient has normal mood and affect. Patient is alert and oriented to person, place, and time.   HEENT: Atraumatic, normocephalic. Pupils equal and reactive to light. Extraocular motion intact. Noninjected sclera.  Cardiovascular: Regular rate and rhythm, with no murmurs, rubs, or gallops. Distal pulses palpable.  Respiratory: Lungs clear to auscultation bilaterally.   Left Knee: Soft tissue swelling: minimal Effusion: none Erythema: none Crepitance: mild Tenderness: medial Alignment: relative varus Mediolateral laxity: medial pseudolaxity Posterior sag: negative Patellar tracking: Good tracking without evidence of subluxation or tilt Atrophy: No significant atrophy.  Quadriceps tone was fair to good. Range of motion: 0/0/133 degrees  Sensation intact over the saphenous, lateral sural  cutaneous, superficial fibular, and deep fibular nerve distributions.  Tests Performed/Reviewed:  X-rays  Anteroposterior, lateral, and sunrise views of the left knee were obtained. Images reveal severe loss of medial compartment joint space with associated osteophyte formation. Lateral compartment is relatively well-preserved. Patellofemoral joint reveals mild loss of joint space with osteophyte formation. No fractures or dislocations noted.  Impression:   ICD-10-CM  1. Primary osteoarthritis of left knee M17.12  2. Preop testing Z01.818   Plan:   -Left knee osteoarthritis The patient has end-stage degenerative changes of the left knee. It was explained to the patient that the condition is progressive in nature. Having failed conservative treatment, the patient has elected to proceed with a total joint arthroplasty. The patient will undergo a total joint arthroplasty with Dr. Marry Guan. The risks of surgery, including blood clot and infection, were discussed with the patient. Measures to reduce these risks, including the use of anticoagulation, perioperative antibiotics, and early ambulation were discussed. The importance of postoperative physical therapy was discussed with the patient. The patient elects to proceed with surgery. The patient is instructed to stop all blood thinners prior to surgery. The  patient is instructed to call the hospital the day before surgery to learn of the proper arrival time.   Contact our office with any questions or concerns. Follow up as indicated, or sooner should any new problems arise, if conditions worsen, or if they are otherwise concerned.   Gwenlyn Fudge, PA-C [facility] [address], [place] [address] Phone: [phone]  This note was generated in part with voice recognition software and I apologize for any typographical errors that were not detected and corrected.

## 2019-09-25 ENCOUNTER — Other Ambulatory Visit: Payer: Self-pay

## 2019-09-25 ENCOUNTER — Inpatient Hospital Stay
Admission: RE | Admit: 2019-09-25 | Discharge: 2019-09-27 | DRG: 470 | Disposition: A | Discharge status: Home Health | Payer: Medicare Other | Attending: Orthopedic Surgery | Admitting: Orthopedic Surgery

## 2019-09-25 ENCOUNTER — Inpatient Hospital Stay: Payer: Medicare Other

## 2019-09-25 ENCOUNTER — Encounter
Admission: RE | Disposition: A | Discharge status: Home Health | Payer: Self-pay | Source: Home / Self Care | Attending: Orthopedic Surgery

## 2019-09-25 ENCOUNTER — Inpatient Hospital Stay: Payer: Medicare Other | Admitting: Certified Registered"

## 2019-09-25 ENCOUNTER — Encounter: Payer: Self-pay | Admitting: Orthopedic Surgery

## 2019-09-25 DIAGNOSIS — Z96659 Presence of unspecified artificial knee joint: Secondary | ICD-10-CM

## 2019-09-25 DIAGNOSIS — Z79899 Other long term (current) drug therapy: Secondary | ICD-10-CM

## 2019-09-25 DIAGNOSIS — M1712 Unilateral primary osteoarthritis, left knee: Secondary | ICD-10-CM | POA: Diagnosis present

## 2019-09-25 DIAGNOSIS — M810 Age-related osteoporosis without current pathological fracture: Secondary | ICD-10-CM | POA: Diagnosis present

## 2019-09-25 DIAGNOSIS — Z8673 Personal history of transient ischemic attack (TIA), and cerebral infarction without residual deficits: Secondary | ICD-10-CM

## 2019-09-25 DIAGNOSIS — E785 Hyperlipidemia, unspecified: Secondary | ICD-10-CM | POA: Diagnosis present

## 2019-09-25 DIAGNOSIS — Z801 Family history of malignant neoplasm of trachea, bronchus and lung: Secondary | ICD-10-CM

## 2019-09-25 DIAGNOSIS — Z20822 Contact with and (suspected) exposure to covid-19: Secondary | ICD-10-CM | POA: Diagnosis present

## 2019-09-25 DIAGNOSIS — I10 Essential (primary) hypertension: Secondary | ICD-10-CM | POA: Diagnosis present

## 2019-09-25 DIAGNOSIS — Z7982 Long term (current) use of aspirin: Secondary | ICD-10-CM

## 2019-09-25 DIAGNOSIS — Z86010 Personal history of colonic polyps: Secondary | ICD-10-CM

## 2019-09-25 DIAGNOSIS — Z8249 Family history of ischemic heart disease and other diseases of the circulatory system: Secondary | ICD-10-CM

## 2019-09-25 DIAGNOSIS — Z79891 Long term (current) use of opiate analgesic: Secondary | ICD-10-CM | POA: Diagnosis not present

## 2019-09-25 DIAGNOSIS — Z87891 Personal history of nicotine dependence: Secondary | ICD-10-CM | POA: Diagnosis not present

## 2019-09-25 DIAGNOSIS — R55 Syncope and collapse: Secondary | ICD-10-CM | POA: Diagnosis present

## 2019-09-25 HISTORY — PX: KNEE ARTHROPLASTY: SHX992

## 2019-09-25 SURGERY — ARTHROPLASTY, KNEE, TOTAL, USING IMAGELESS COMPUTER-ASSISTED NAVIGATION
Anesthesia: Spinal | Site: Knee | Laterality: Left

## 2019-09-25 MED ORDER — PANTOPRAZOLE SODIUM 40 MG PO TBEC
40.0000 mg | DELAYED_RELEASE_TABLET | Freq: Two times a day (BID) | ORAL | Status: DC
  Administered 2019-09-25 – 2019-09-27 (×4): 40 mg via ORAL
  Filled 2019-09-25 (×4): qty 1

## 2019-09-25 MED ORDER — CELECOXIB 200 MG PO CAPS: 400.0000 mg | ORAL_CAPSULE | Freq: Once | ORAL | Status: AC

## 2019-09-25 MED ORDER — PROPOFOL 500 MG/50ML IV EMUL
INTRAVENOUS | Status: AC
  Filled 2019-09-25: qty 50

## 2019-09-25 MED ORDER — GABAPENTIN 300 MG PO CAPS: 300.0000 mg | ORAL_CAPSULE | Freq: Once | ORAL | Status: AC

## 2019-09-25 MED ORDER — SODIUM CHLORIDE FLUSH 0.9 % IV SOLN
INTRAVENOUS | Status: AC
  Filled 2019-09-25: qty 40

## 2019-09-25 MED ORDER — MENTHOL 3 MG MT LOZG
1.0000 | LOZENGE | OROMUCOSAL | Status: DC | PRN
  Filled 2019-09-25: qty 9

## 2019-09-25 MED ORDER — DEXAMETHASONE SODIUM PHOSPHATE 10 MG/ML IJ SOLN
INTRAMUSCULAR | Status: AC
  Administered 2019-09-25: 8 mg via INTRAVENOUS
  Filled 2019-09-25: qty 1

## 2019-09-25 MED ORDER — METOCLOPRAMIDE HCL 5 MG/ML IJ SOLN: 5.0000 mg | Freq: Three times a day (TID) | INTRAMUSCULAR | Status: DC | PRN

## 2019-09-25 MED ORDER — HYDROMORPHONE HCL 1 MG/ML IJ SOLN: 0.5000 mg | INTRAMUSCULAR | Status: DC | PRN

## 2019-09-25 MED ORDER — CHLORHEXIDINE GLUCONATE 4 % EX LIQD
60.0000 mL | Freq: Once | CUTANEOUS | Status: AC
  Administered 2019-09-25: 4 via TOPICAL

## 2019-09-25 MED ORDER — ACETAMINOPHEN 10 MG/ML IV SOLN
1000.0000 mg | Freq: Four times a day (QID) | INTRAVENOUS | Status: AC
  Administered 2019-09-25 – 2019-09-26 (×3): 1000 mg via INTRAVENOUS
  Filled 2019-09-25 (×3): qty 100

## 2019-09-25 MED ORDER — ACETAMINOPHEN 325 MG PO TABS: 325.0000 mg | ORAL_TABLET | Freq: Four times a day (QID) | ORAL | Status: DC | PRN

## 2019-09-25 MED ORDER — ENSURE PRE-SURGERY PO LIQD
296.0000 mL | Freq: Once | ORAL | Status: AC
  Administered 2019-09-25: 296 mL via ORAL

## 2019-09-25 MED ORDER — CELECOXIB 200 MG PO CAPS
200.0000 mg | ORAL_CAPSULE | Freq: Two times a day (BID) | ORAL | Status: DC
  Administered 2019-09-25 – 2019-09-27 (×4): 200 mg via ORAL
  Filled 2019-09-25 (×5): qty 1

## 2019-09-25 MED ORDER — SENNOSIDES-DOCUSATE SODIUM 8.6-50 MG PO TABS
1.0000 | ORAL_TABLET | Freq: Two times a day (BID) | ORAL | Status: DC
  Administered 2019-09-25 – 2019-09-27 (×4): 1 via ORAL
  Filled 2019-09-25 (×4): qty 1

## 2019-09-25 MED ORDER — METOCLOPRAMIDE HCL 10 MG PO TABS: 5.0000 mg | ORAL_TABLET | Freq: Three times a day (TID) | ORAL | Status: DC | PRN

## 2019-09-25 MED ORDER — OXYCODONE HCL 5 MG PO TABS: 10.0000 mg | ORAL_TABLET | ORAL | Status: DC | PRN

## 2019-09-25 MED ORDER — ONDANSETRON HCL 4 MG/2ML IJ SOLN: 4.0000 mg | Freq: Once | INTRAMUSCULAR | Status: DC | PRN

## 2019-09-25 MED ORDER — ONDANSETRON HCL 4 MG/2ML IJ SOLN
INTRAMUSCULAR | Status: DC | PRN
  Administered 2019-09-25: 4 mg via INTRAVENOUS

## 2019-09-25 MED ORDER — ENOXAPARIN SODIUM 30 MG/0.3ML ~~LOC~~ SOLN
30.0000 mg | Freq: Two times a day (BID) | SUBCUTANEOUS | Status: DC
  Administered 2019-09-26 – 2019-09-27 (×3): 30 mg via SUBCUTANEOUS
  Filled 2019-09-25 (×3): qty 0.3

## 2019-09-25 MED ORDER — MEMANTINE HCL 5 MG PO TABS
5.0000 mg | ORAL_TABLET | Freq: Two times a day (BID) | ORAL | Status: DC
  Administered 2019-09-25 – 2019-09-27 (×4): 5 mg via ORAL
  Filled 2019-09-25 (×4): qty 1

## 2019-09-25 MED ORDER — METOCLOPRAMIDE HCL 10 MG PO TABS
10.0000 mg | ORAL_TABLET | Freq: Three times a day (TID) | ORAL | Status: DC
  Administered 2019-09-25 – 2019-09-27 (×7): 10 mg via ORAL
  Filled 2019-09-25 (×7): qty 1

## 2019-09-25 MED ORDER — DIPHENHYDRAMINE HCL 12.5 MG/5ML PO ELIX: 12.5000 mg | ORAL_SOLUTION | ORAL | Status: DC | PRN

## 2019-09-25 MED ORDER — CEFAZOLIN SODIUM-DEXTROSE 2-4 GM/100ML-% IV SOLN
2.0000 g | Freq: Four times a day (QID) | INTRAVENOUS | Status: AC
  Administered 2019-09-25 – 2019-09-26 (×3): 2 g via INTRAVENOUS
  Filled 2019-09-25 (×4): qty 100

## 2019-09-25 MED ORDER — LACTATED RINGERS IV SOLN: INTRAVENOUS | Status: DC

## 2019-09-25 MED ORDER — MAGNESIUM HYDROXIDE 400 MG/5ML PO SUSP
30.0000 mL | Freq: Every day | ORAL | Status: DC
  Administered 2019-09-26 – 2019-09-27 (×2): 30 mL via ORAL
  Filled 2019-09-25 (×2): qty 30

## 2019-09-25 MED ORDER — ALUM & MAG HYDROXIDE-SIMETH 200-200-20 MG/5ML PO SUSP: 30.0000 mL | ORAL | Status: DC | PRN

## 2019-09-25 MED ORDER — SODIUM CHLORIDE 0.9 % IV SOLN: INTRAVENOUS | Status: DC

## 2019-09-25 MED ORDER — BUPIVACAINE HCL (PF) 0.5 % IJ SOLN
INTRAMUSCULAR | Status: AC
  Filled 2019-09-25: qty 10

## 2019-09-25 MED ORDER — CEFAZOLIN SODIUM-DEXTROSE 2-4 GM/100ML-% IV SOLN
2.0000 g | INTRAVENOUS | Status: AC
  Administered 2019-09-25: 2 g via INTRAVENOUS

## 2019-09-25 MED ORDER — PROPOFOL 10 MG/ML IV BOLUS
INTRAVENOUS | Status: AC
  Filled 2019-09-25: qty 20

## 2019-09-25 MED ORDER — EPHEDRINE SULFATE 50 MG/ML IJ SOLN
INTRAMUSCULAR | Status: DC | PRN
  Administered 2019-09-25 (×3): 10 mg via INTRAVENOUS

## 2019-09-25 MED ORDER — BUPIVACAINE HCL (PF) 0.25 % IJ SOLN
INTRAMUSCULAR | Status: AC
  Filled 2019-09-25: qty 60

## 2019-09-25 MED ORDER — BUPIVACAINE HCL (PF) 0.25 % IJ SOLN
INTRAMUSCULAR | Status: DC | PRN
  Administered 2019-09-25: 60 mL

## 2019-09-25 MED ORDER — TRAMADOL HCL 50 MG PO TABS
50.0000 mg | ORAL_TABLET | ORAL | Status: DC | PRN
  Administered 2019-09-25 – 2019-09-27 (×3): 50 mg via ORAL
  Filled 2019-09-25 (×3): qty 1

## 2019-09-25 MED ORDER — ACETAMINOPHEN 10 MG/ML IV SOLN
INTRAVENOUS | Status: AC
  Filled 2019-09-25: qty 100

## 2019-09-25 MED ORDER — FERROUS SULFATE 325 (65 FE) MG PO TABS
325.0000 mg | ORAL_TABLET | Freq: Two times a day (BID) | ORAL | Status: DC
  Administered 2019-09-25 – 2019-09-27 (×4): 325 mg via ORAL
  Filled 2019-09-25 (×4): qty 1

## 2019-09-25 MED ORDER — ONDANSETRON HCL 4 MG PO TABS: 4.0000 mg | ORAL_TABLET | Freq: Four times a day (QID) | ORAL | Status: DC | PRN

## 2019-09-25 MED ORDER — ROSUVASTATIN CALCIUM 5 MG PO TABS: 5.0000 mg | ORAL_TABLET | ORAL | Status: DC

## 2019-09-25 MED ORDER — TRANEXAMIC ACID-NACL 1000-0.7 MG/100ML-% IV SOLN
1000.0000 mg | Freq: Once | INTRAVENOUS | Status: AC
  Administered 2019-09-25: 1000 mg via INTRAVENOUS

## 2019-09-25 MED ORDER — TRANEXAMIC ACID-NACL 1000-0.7 MG/100ML-% IV SOLN
INTRAVENOUS | Status: AC
  Filled 2019-09-25: qty 100

## 2019-09-25 MED ORDER — DEXAMETHASONE SODIUM PHOSPHATE 10 MG/ML IJ SOLN: 8.0000 mg | Freq: Once | INTRAMUSCULAR | Status: AC

## 2019-09-25 MED ORDER — BUPIVACAINE LIPOSOME 1.3 % IJ SUSP
INTRAMUSCULAR | Status: AC
  Filled 2019-09-25: qty 20

## 2019-09-25 MED ORDER — PHENYLEPHRINE HCL (PRESSORS) 10 MG/ML IV SOLN
INTRAVENOUS | Status: AC
  Filled 2019-09-25: qty 1

## 2019-09-25 MED ORDER — CEFAZOLIN SODIUM-DEXTROSE 2-4 GM/100ML-% IV SOLN
INTRAVENOUS | Status: AC
  Administered 2019-09-25: 2 g via INTRAVENOUS
  Filled 2019-09-25: qty 100

## 2019-09-25 MED ORDER — FENTANYL CITRATE (PF) 100 MCG/2ML IJ SOLN: 25.0000 ug | INTRAMUSCULAR | Status: DC | PRN

## 2019-09-25 MED ORDER — ACETAMINOPHEN 10 MG/ML IV SOLN
INTRAVENOUS | Status: DC | PRN
  Administered 2019-09-25: 1000 mg via INTRAVENOUS

## 2019-09-25 MED ORDER — IPRATROPIUM BROMIDE 0.03 % NA SOLN
1.0000 | Freq: Two times a day (BID) | NASAL | Status: DC
  Administered 2019-09-26: 1 via NASAL
  Filled 2019-09-25: qty 30

## 2019-09-25 MED ORDER — ENOXAPARIN SODIUM 30 MG/0.3ML ~~LOC~~ SOLN: 30.0000 mg | Freq: Two times a day (BID) | SUBCUTANEOUS | Status: DC

## 2019-09-25 MED ORDER — VITAMIN B-12 1000 MCG PO TABS
1000.0000 ug | ORAL_TABLET | Freq: Every day | ORAL | Status: DC
  Administered 2019-09-26 – 2019-09-27 (×2): 1000 ug via ORAL
  Filled 2019-09-25 (×2): qty 1

## 2019-09-25 MED ORDER — PHENOL 1.4 % MT LIQD
1.0000 | OROMUCOSAL | Status: DC | PRN
  Filled 2019-09-25: qty 177

## 2019-09-25 MED ORDER — ONDANSETRON HCL 4 MG/2ML IJ SOLN
INTRAMUSCULAR | Status: AC
  Filled 2019-09-25: qty 2

## 2019-09-25 MED ORDER — NEOMYCIN-POLYMYXIN B GU 40-200000 IR SOLN
Status: DC | PRN
  Administered 2019-09-25: 14 mL

## 2019-09-25 MED ORDER — SODIUM CHLORIDE 0.9 % IV SOLN
INTRAVENOUS | Status: DC | PRN
  Administered 2019-09-25: 60 mL

## 2019-09-25 MED ORDER — OXYCODONE HCL 5 MG PO TABS: 5.0000 mg | ORAL_TABLET | ORAL | Status: DC | PRN

## 2019-09-25 MED ORDER — CELECOXIB 200 MG PO CAPS
ORAL_CAPSULE | ORAL | Status: AC
  Administered 2019-09-25: 400 mg via ORAL
  Filled 2019-09-25: qty 2

## 2019-09-25 MED ORDER — GABAPENTIN 300 MG PO CAPS
ORAL_CAPSULE | ORAL | Status: AC
  Administered 2019-09-25: 300 mg via ORAL
  Filled 2019-09-25: qty 1

## 2019-09-25 MED ORDER — FLEET ENEMA 7-19 GM/118ML RE ENEM: 1.0000 | ENEMA | Freq: Once | RECTAL | Status: DC | PRN

## 2019-09-25 MED ORDER — BUPIVACAINE HCL (PF) 0.5 % IJ SOLN
INTRAMUSCULAR | Status: DC | PRN
  Administered 2019-09-25: 3 mL

## 2019-09-25 MED ORDER — GABAPENTIN 300 MG PO CAPS
300.0000 mg | ORAL_CAPSULE | Freq: Every day | ORAL | Status: DC
  Administered 2019-09-25 – 2019-09-26 (×2): 300 mg via ORAL
  Filled 2019-09-25 (×2): qty 1

## 2019-09-25 MED ORDER — ONDANSETRON HCL 4 MG/2ML IJ SOLN: 4.0000 mg | Freq: Four times a day (QID) | INTRAMUSCULAR | Status: DC | PRN

## 2019-09-25 MED ORDER — NEOMYCIN-POLYMYXIN B GU 40-200000 IR SOLN
Status: AC
  Filled 2019-09-25: qty 20

## 2019-09-25 MED ORDER — TRANEXAMIC ACID-NACL 1000-0.7 MG/100ML-% IV SOLN
1000.0000 mg | INTRAVENOUS | Status: AC
  Administered 2019-09-25: 1000 mg via INTRAVENOUS

## 2019-09-25 MED ORDER — PROPOFOL 500 MG/50ML IV EMUL
INTRAVENOUS | Status: DC | PRN
  Administered 2019-09-25: 60 ug/kg/min via INTRAVENOUS

## 2019-09-25 MED ORDER — BISACODYL 10 MG RE SUPP
10.0000 mg | Freq: Every day | RECTAL | Status: DC | PRN
  Administered 2019-09-26: 10 mg via RECTAL
  Filled 2019-09-25: qty 1

## 2019-09-25 MED ORDER — VITAMIN D 25 MCG (1000 UNIT) PO TABS
2000.0000 [IU] | ORAL_TABLET | Freq: Every day | ORAL | Status: DC
  Administered 2019-09-26 – 2019-09-27 (×2): 2000 [IU] via ORAL
  Filled 2019-09-25 (×2): qty 2

## 2019-09-25 SURGICAL SUPPLY — 74 items
ATTUNE PSFEM LTSZ6 NARCEM KNEE (Femur) ×2 IMPLANT
ATTUNE PSRP INSR SZ6 7 KNEE (Insert) ×1 IMPLANT
ATTUNE PSRP INSR SZ6 7MM KNEE (Insert) ×1 IMPLANT
BASE TIBIAL ROT PLAT SZ 5 KNEE (Knees) IMPLANT
BATTERY INSTRU NAVIGATION (MISCELLANEOUS) ×12 IMPLANT
BLADE SAW 70X12.5 (BLADE) ×3 IMPLANT
BLADE SAW 90X13X1.19 OSCILLAT (BLADE) ×3 IMPLANT
BLADE SAW 90X25X1.19 OSCILLAT (BLADE) ×3 IMPLANT
CANISTER SUCT 3000ML PPV (MISCELLANEOUS) ×3 IMPLANT
CEMENT HV SMART SET (Cement) ×4 IMPLANT
COOLER POLAR GLACIER W/PUMP (MISCELLANEOUS) ×3 IMPLANT
COVER WAND RF STERILE (DRAPES) ×3 IMPLANT
CUFF TOURN SGL QUICK 24 (TOURNIQUET CUFF) ×2
CUFF TOURN SGL QUICK 30 (TOURNIQUET CUFF)
CUFF TRNQT CYL 24X4X16.5-23 (TOURNIQUET CUFF) IMPLANT
CUFF TRNQT CYL 30X4X21-28X (TOURNIQUET CUFF) IMPLANT
DRAPE 3/4 80X56 (DRAPES) ×3 IMPLANT
DRSG DERMACEA 8X12 NADH (GAUZE/BANDAGES/DRESSINGS) ×3 IMPLANT
DRSG OPSITE POSTOP 4X14 (GAUZE/BANDAGES/DRESSINGS) ×3 IMPLANT
DRSG TEGADERM 4X4.75 (GAUZE/BANDAGES/DRESSINGS) ×3 IMPLANT
DURAPREP 26ML APPLICATOR (WOUND CARE) ×6 IMPLANT
ELECT REM PT RETURN 9FT ADLT (ELECTROSURGICAL) ×3
ELECTRODE REM PT RTRN 9FT ADLT (ELECTROSURGICAL) ×1 IMPLANT
EX-PIN ORTHOLOCK NAV 4X150 (PIN) ×6 IMPLANT
GLOVE BIO SURGEON STRL SZ7.5 (GLOVE) ×6 IMPLANT
GLOVE BIOGEL M STRL SZ7.5 (GLOVE) ×6 IMPLANT
GLOVE BIOGEL PI IND STRL 7.5 (GLOVE) ×1 IMPLANT
GLOVE BIOGEL PI INDICATOR 7.5 (GLOVE) ×2
GLOVE INDICATOR 8.0 STRL GRN (GLOVE) ×3 IMPLANT
GOWN STRL REUS W/ TWL LRG LVL3 (GOWN DISPOSABLE) ×2 IMPLANT
GOWN STRL REUS W/ TWL XL LVL3 (GOWN DISPOSABLE) ×1 IMPLANT
GOWN STRL REUS W/TWL LRG LVL3 (GOWN DISPOSABLE) ×4
GOWN STRL REUS W/TWL XL LVL3 (GOWN DISPOSABLE) ×2
HEMOVAC 400CC 10FR (MISCELLANEOUS) ×3 IMPLANT
HOLDER FOLEY CATH W/STRAP (MISCELLANEOUS) ×3 IMPLANT
HOOD PEEL AWAY FLYTE STAYCOOL (MISCELLANEOUS) ×6 IMPLANT
KIT TURNOVER KIT A (KITS) ×3 IMPLANT
KNIFE SCULPS 14X20 (INSTRUMENTS) ×3 IMPLANT
LABEL OR SOLS (LABEL) ×3 IMPLANT
MANIFOLD NEPTUNE II (INSTRUMENTS) ×3 IMPLANT
NDL SAFETY ECLIPSE 18X1.5 (NEEDLE) ×1 IMPLANT
NDL SPNL 20GX3.5 QUINCKE YW (NEEDLE) ×2 IMPLANT
NEEDLE HYPO 18GX1.5 SHARP (NEEDLE) ×2
NEEDLE SPNL 20GX3.5 QUINCKE YW (NEEDLE) ×6 IMPLANT
NS IRRIG 500ML POUR BTL (IV SOLUTION) ×3 IMPLANT
PACK TOTAL KNEE (MISCELLANEOUS) ×3 IMPLANT
PAD WRAPON POLAR KNEE (MISCELLANEOUS) ×1 IMPLANT
PATELLA MEDIAL ATTUN 35MM KNEE (Knees) ×2 IMPLANT
PENCIL SMOKE EVACUATOR COATED (MISCELLANEOUS) ×3 IMPLANT
PENCIL SMOKE ULTRAEVAC 22 CON (MISCELLANEOUS) ×3 IMPLANT
PIN DRILL QUICK PACK ×3 IMPLANT
PIN FIXATION 1/8DIA X 3INL (PIN) ×9 IMPLANT
PULSAVAC PLUS IRRIG FAN TIP (DISPOSABLE) ×3
SOL .9 NS 3000ML IRR  AL (IV SOLUTION) ×2
SOL .9 NS 3000ML IRR UROMATIC (IV SOLUTION) ×1 IMPLANT
SOL PREP PVP 2OZ (MISCELLANEOUS) ×3
SOLUTION PREP PVP 2OZ (MISCELLANEOUS) ×1 IMPLANT
SPONGE DRAIN TRACH 4X4 STRL 2S (GAUZE/BANDAGES/DRESSINGS) ×3 IMPLANT
STAPLER SKIN PROX 35W (STAPLE) ×3 IMPLANT
STOCKINETTE IMPERV 14X48 (MISCELLANEOUS) IMPLANT
STRAP TIBIA SHORT (MISCELLANEOUS) ×3 IMPLANT
SUCTION FRAZIER HANDLE 10FR (MISCELLANEOUS) ×2
SUCTION TUBE FRAZIER 10FR DISP (MISCELLANEOUS) ×1 IMPLANT
SUT VIC AB 0 CT1 36 (SUTURE) ×6 IMPLANT
SUT VIC AB 1 CT1 36 (SUTURE) ×6 IMPLANT
SUT VIC AB 2-0 CT2 27 (SUTURE) ×3 IMPLANT
SYR 20ML LL LF (SYRINGE) ×3 IMPLANT
SYR 30ML LL (SYRINGE) ×6 IMPLANT
TIBIAL BASE ROT PLAT SZ 5 KNEE (Knees) ×3 IMPLANT
TIP FAN IRRIG PULSAVAC PLUS (DISPOSABLE) ×1 IMPLANT
TOWEL OR 17X26 4PK STRL BLUE (TOWEL DISPOSABLE) ×3 IMPLANT
TOWER CARTRIDGE SMART MIX (DISPOSABLE) ×3 IMPLANT
TRAY FOLEY MTR SLVR 16FR STAT (SET/KITS/TRAYS/PACK) ×3 IMPLANT
WRAPON POLAR PAD KNEE (MISCELLANEOUS) ×3

## 2019-09-25 NOTE — Transfer of Care (Signed)
Immediate Anesthesia Transfer of Care Note  Patient: Karen Parsons  Procedure(s) Performed: COMPUTER ASSISTED TOTAL KNEE ARTHROPLASTY (Left Knee)  Patient Location: PACU  Anesthesia Type:Spinal  Level of Consciousness: awake, alert  and oriented  Airway & Oxygen Therapy: Patient Spontanous Breathing  Post-op Assessment: Report given to RN and Post -op Vital signs reviewed and stable  Post vital signs: Reviewed and stable  Last Vitals:  Vitals Value Taken Time  BP 113/71 09/25/19 1041  Temp    Pulse 57 09/25/19 1044  Resp 14 09/25/19 1044  SpO2 97 % 09/25/19 1044  Vitals shown include unvalidated device data.  Last Pain:  Vitals:   09/25/19 0627  TempSrc: Tympanic         Complications: No apparent anesthesia complications

## 2019-09-25 NOTE — Plan of Care (Signed)
  Problem: Education: Goal: Knowledge of General Education information will improve Description: Including pain rating scale, medication(s)/side effects and non-pharmacologic comfort measures Outcome: Progressing   Problem: Health Behavior/Discharge Planning: Goal: Ability to manage health-related needs will improve Outcome: Progressing   Problem: Clinical Measurements: Goal: Will remain free from infection Outcome: Progressing   

## 2019-09-25 NOTE — H&P (Signed)
The patient has been re-examined, and the chart reviewed, and there have been no interval changes to the documented history and physical.    The risks, benefits, and alternatives have been discussed at length. The patient expressed understanding of the risks benefits and agreed with plans for surgical intervention.  Karen Parsons, [name] M.D.    

## 2019-09-25 NOTE — Evaluation (Signed)
Physical Therapy Evaluation Patient Details Name: Karen Parsons MRN: [id number] DOB: [birth year] Today's Date: 09/25/2019   History of Present Illness  Pt admitted for L TKR. HIstory includes HTN, Hernia, and TIA. Previous history of R TKR.  Clinical Impression  Pt is a pleasant 79 year old female who was admitted for L TKR. Pt performs bed mobility, transfers, and ambulation with cga and RW. Pt demonstrates ability to perform 10 SLRs with independence, therefore does not require KI for mobility. Pt demonstrates deficits with strength/pain/mobility. Very motivated to participate. Would benefit from skilled PT to address above deficits and promote optimal return to PLOF. Recommend transition to [facility] upon discharge from acute hospitalization.     Follow Up Recommendations Home health PT    Equipment Recommendations  None recommended by PT    Recommendations for Other Services       Precautions / Restrictions Precautions Precautions: Fall;Knee Precaution Booklet Issued: No Restrictions Weight Bearing Restrictions: Yes LLE Weight Bearing: Weight bearing as tolerated      Mobility  Bed Mobility Overal bed mobility: Needs Assistance Bed Mobility: Supine to Sit     Supine to sit: Min guard     General bed mobility comments: safe technique with upright posture. Ease of technique  Transfers Overall transfer level: Needs assistance Equipment used: Rolling walker (2 wheeled) Transfers: Sit to/from Stand Sit to Stand: Min guard         General transfer comment: safe technique with upright posture. RW used  Ambulation/Gait Ambulation/Gait assistance: Counsellor (Feet): 50 Feet Assistive device: Rolling walker (2 wheeled) Gait Pattern/deviations: Step-to pattern     General Gait Details: ambulated using step to gait pattern. Slight antaglic gait noted. Safe technique performed.  Stairs            Wheelchair Mobility    Modified Rankin (Stroke Patients  Only)       Balance Overall balance assessment: Needs assistance Sitting-balance support: Feet supported Sitting balance-Leahy Scale: Good     Standing balance support: Bilateral upper extremity supported Standing balance-Leahy Scale: Good                               Pertinent Vitals/Pain Pain Assessment: 0-10 Pain Score: 2  Pain Location: L knee Pain Descriptors / Indicators: Operative site guarding Pain Intervention(s): Limited activity within patient's tolerance;Ice applied    Home Living Family/patient expects to be discharged to:: Private residence Living Arrangements: Alone Available Help at Discharge: Family(has boyfriend who can stay as needed) Type of Home: House Home Access: Stairs to enter Entrance Stairs-Rails: None(has grab bars available) Entrance Stairs-Number of Steps: 2 Home Layout: One level Home Equipment: [place] - 2 wheels;Cane - single point(elevated toilet)      Prior Function Level of Independence: Independent         Comments: active prior to baseline     Hand Dominance        Extremity/Trunk Assessment   Upper Extremity Assessment Upper Extremity Assessment: Overall WFL for tasks assessed    Lower Extremity Assessment Lower Extremity Assessment: Generalized weakness(L LE grossly 3/5)       Communication   Communication: No difficulties  Cognition Arousal/Alertness: Awake/alert Behavior During Therapy: WFL for tasks assessed/performed Overall Cognitive Status: Within Functional Limits for tasks assessed  General Comments      Exercises Total Joint Exercises Goniometric ROM: L knee ROM 1-65 degrees Other Exercises Other Exercises: supine ther-ex performed on L LE including AP, quad sets, SLRs, and hip abd/add and knee flexion. 10 reps performed with cga. Other Exercises: ambulated to bathroom with cga. Supervision with hygiene. No unsteadiness noted    Assessment/Plan    PT Assessment Patient needs continued PT services  PT Problem List Decreased strength;Decreased activity tolerance;Decreased mobility;Pain       PT Treatment Interventions DME instruction;Gait training;Therapeutic exercise;Balance training    PT Goals (Current goals can be found in the Care Plan section)  Acute Rehab PT Goals Patient Stated Goal: to go home PT Goal Formulation: With patient Time For Goal Achievement: 10/09/19 Potential to Achieve Goals: Good    Frequency BID   Barriers to discharge        Co-evaluation               AM-PAC PT "6 Clicks" Mobility  Outcome Measure Help needed turning from your back to your side while in a flat bed without using bedrails?: A Little Help needed moving from lying on your back to sitting on the side of a flat bed without using bedrails?: A Little Help needed moving to and from a bed to a chair (including a wheelchair)?: A Little Help needed standing up from a chair using your arms (e.g., wheelchair or bedside chair)?: A Little Help needed to walk in hospital room?: A Little Help needed climbing 3-5 steps with a railing? : A Little 6 Click Score: 18    End of Session Equipment Utilized During Treatment: Gait belt Activity Tolerance: Patient tolerated treatment well Patient left: in chair;with chair alarm set Nurse Communication: Mobility status PT Visit Diagnosis: Muscle weakness (generalized) (M62.81);Difficulty in walking, not elsewhere classified (R26.2);Pain Pain - Right/Left: Left Pain - part of body: Knee    Time: [id number] PT Time Calculation (min) (ACUTE ONLY): 35 min   Charges:   PT Evaluation $PT Eval Moderate Complexity: 1 Mod PT Treatments $Therapeutic Exercise: 8-22 mins $Therapeutic Activity: 8-22 mins        Greggory Stallion, PT, DPT [phone]   Karen Parsons 09/25/2019, 5:18 PM

## 2019-09-25 NOTE — Anesthesia Procedure Notes (Addendum)
Spinal  Patient location during procedure: OR Start time: 09/25/2019 7:18 AM End time: 09/25/2019 7:25 AM Staffing Performed: resident/CRNA  Anesthesiologist: Martha Clan, MD Resident/CRNA: Mina Marble D, CRNA Preanesthetic Checklist Completed: patient identified, IV checked, site marked, risks and benefits discussed, surgical consent, monitors and equipment checked, pre-op evaluation and timeout performed Spinal Block Patient position: sitting Prep: ChloraPrep and site prepped and draped Patient monitoring: heart rate, continuous pulse ox and blood pressure Approach: midline Location: L2-3 Injection technique: single-shot Needle Needle type: Pencan  Needle gauge: 24 G Needle length: 10 cm Assessment Sensory level: T6 Additional Notes Pt tolerated procedure, w/o complications, free flow CSF observed before injection

## 2019-09-25 NOTE — Op Note (Signed)
OPERATIVE NOTE  DATE OF SURGERY:  09/25/2019  PATIENT NAME:  Karen Parsons   DOB: [birth year]  MRN: [id number]  PRE-OPERATIVE DIAGNOSIS: Degenerative arthrosis of the left knee, primary  POST-OPERATIVE DIAGNOSIS:  Same  PROCEDURE:  Left total knee arthroplasty using computer-assisted navigation  SURGEON:  Marciano Sequin. M.D.  ASSISTANT: Cassell Smiles, PA-C (present and scrubbed throughout the case, critical for assistance with exposure, retraction, instrumentation, and closure)  ANESTHESIA: spinal  ESTIMATED BLOOD LOSS: 50 mL  FLUIDS REPLACED: 1000 mL of crystalloid  TOURNIQUET TIME: 77 minutes  DRAINS: 2 medium Hemovac drains  SOFT TISSUE RELEASES: Anterior cruciate ligament, posterior cruciate ligament, deep medial collateral ligament, patellofemoral ligament  IMPLANTS UTILIZED: DePuy Attune size 6N posterior stabilized femoral component (cemented), size 5 rotating platform tibial component (cemented), 35 mm medialized dome patella (cemented), and a 7 mm stabilized rotating platform polyethylene insert.  INDICATIONS FOR SURGERY: Karen Parsons is a 79 y.o. year old female with a long history of progressive knee pain. X-rays demonstrated severe degenerative changes in tricompartmental fashion. The patient had not seen any significant improvement despite conservative nonsurgical intervention. After discussion of the risks and benefits of surgical intervention, the patient expressed understanding of the risks benefits and agree with plans for total knee arthroplasty.   The risks, benefits, and alternatives were discussed at length including but not limited to the risks of infection, bleeding, nerve injury, stiffness, blood clots, the need for revision surgery, cardiopulmonary complications, among others, and they were willing to proceed.  PROCEDURE IN DETAIL: The patient was brought into the operating room and, after adequate spinal anesthesia was achieved, a tourniquet was placed on the  patient's upper thigh. The patient's knee and leg were cleaned and prepped with alcohol and DuraPrep and draped in the usual sterile fashion. A "timeout" was performed as per usual protocol. The lower extremity was exsanguinated using an Esmarch, and the tourniquet was inflated to 300 mmHg. An anterior longitudinal incision was made followed by a standard mid vastus approach. The deep fibers of the medial collateral ligament were elevated in a subperiosteal fashion off of the medial flare of the tibia so as to maintain a continuous soft tissue sleeve. The patella was subluxed laterally and the patellofemoral ligament was incised. Inspection of the knee demonstrated severe degenerative changes with full-thickness loss of articular cartilage. Osteophytes were debrided using a rongeur. Anterior and posterior cruciate ligaments were excised. Two 4.0 mm Schanz pins were inserted in the femur and into the tibia for attachment of the array of trackers used for computer-assisted navigation. Hip center was identified using a circumduction technique. Distal landmarks were mapped using the computer. The distal femur and proximal tibia were mapped using the computer. The distal femoral cutting guide was positioned using computer-assisted navigation so as to achieve a 5 distal valgus cut. The femur was sized and it was felt that a size 6N femoral component was appropriate. A size 6 femoral cutting guide was positioned and the anterior cut was performed and verified using the computer. This was followed by completion of the posterior and chamfer cuts. Femoral cutting guide for the central box was then positioned in the center box cut was performed.  Attention was then directed to the proximal tibia. Medial and lateral menisci were excised. The extramedullary tibial cutting guide was positioned using computer-assisted navigation so as to achieve a 0 varus-valgus alignment and 3 posterior slope. The cut was performed and  verified using the computer. The proximal tibia was sized and  it was felt that a size 5 tibial tray was appropriate. Tibial and femoral trials were inserted followed by insertion of a 7 mm polyethylene insert. This allowed for excellent mediolateral soft tissue balancing both in flexion and in full extension. Finally, the patella was cut and prepared so as to accommodate a 35 mm medialized dome patella. A patella trial was placed and the knee was placed through a range of motion with excellent patellar tracking appreciated. The femoral trial was removed after debridement of posterior osteophytes. The central post-hole for the tibial component was reamed followed by insertion of a keel punch. Tibial trials were then removed. Cut surfaces of bone were irrigated with copious amounts of normal saline with antibiotic solution using pulsatile lavage and then suctioned dry. Polymethylmethacrylate cement was prepared in the usual fashion using a vacuum mixer. Cement was applied to the cut surface of the proximal tibia as well as along the undersurface of a size 5 rotating platform tibial component. Tibial component was positioned and impacted into place. Excess cement was removed using [employer] fast streamer. Cement was then applied to the cut surfaces of the femur as well as along the posterior flanges of the size 6N femoral component. The femoral component was positioned and impacted into place. Excess cement was removed using [employer] fast streamer. A 7 mm polyethylene trial was inserted and the knee was brought into full extension with steady axial compression applied. Finally, cement was applied to the backside of a 35 mm medialized dome patella and the patellar component was positioned and patellar clamp applied. Excess cement was removed using [employer] fast streamer. After adequate curing of the cement, the tourniquet was deflated after a total tourniquet time of 77 minutes. Hemostasis was achieved using electrocautery. The knee was  irrigated with copious amounts of normal saline with antibiotic solution using pulsatile lavage and then suctioned dry. 20 mL of 1.3% Exparel and 60 mL of 0.25% Marcaine in 40 mL of normal saline was injected along the posterior capsule, medial and lateral gutters, and along the arthrotomy site. A 7 mm stabilized rotating platform polyethylene insert was inserted and the knee was placed through a range of motion with excellent mediolateral soft tissue balancing appreciated and excellent patellar tracking noted. 2 medium drains were placed in the wound bed and brought out through separate stab incisions. The medial parapatellar portion of the incision was reapproximated using interrupted sutures of #1 Vicryl. Subcutaneous tissue was approximated in layers using first #0 Vicryl followed #2-0 Vicryl. The skin was approximated with skin staples. A sterile dressing was applied.  The patient tolerated the procedure well and was transported to the recovery room in stable condition.    Simonne Boulos P. Holley Bouche., M.D.

## 2019-09-25 NOTE — Anesthesia Preprocedure Evaluation (Signed)
Anesthesia Evaluation  Patient identified by MRN, date of birth, ID band Patient awake    Reviewed: Allergy & Precautions, NPO status , Patient's Chart, lab work & pertinent test results  History of Anesthesia Complications Negative for: history of anesthetic complications  Airway Mallampati: II  TM Distance: >3 FB Neck ROM: Full    Dental no notable dental hx.    Pulmonary neg sleep apnea, neg COPD, former smoker,    breath sounds clear to auscultation- rhonchi (-) wheezing      Cardiovascular Exercise Tolerance: Good (-) hypertension(-) CAD, (-) Past MI and (-) Cardiac Stents  Rhythm:Regular Rate:Normal - Systolic murmurs and - Diastolic murmurs    Neuro/Psych CVA, No Residual Symptoms negative psych ROS   GI/Hepatic Neg liver ROS, hiatal hernia, GERD  ,  Endo/Other  negative endocrine ROSneg diabetes  Renal/GU negative Renal ROS     Musculoskeletal  (+) Arthritis ,   Abdominal (+) - obese,   Peds  Hematology negative hematology ROS (+)   Anesthesia Other Findings Past Medical History: No date: Barrett esophagus     Comment:  2018.  dr. Adria Devon said this has resolved after a recent              endoscopy July 2010: Barrett's esophagus     Comment:  [place],  No date: Benign neoplasm of colon No date: Bilateral renal cysts No date: Colon polyps No date: Cramp of limb No date: Diverticulosis No date: Generalized osteoarthritis No date: Genital prolapse No date: GERD (gastroesophageal reflux disease) No date: H/O seasonal allergies No date: Hallux valgus No date: Hemorrhoids No date: History of hiatal hernia No date: Hyperlipidemia     Comment:  intolerant of statins, normal diagnostic cath July 2010               Nehemiah Massed) No date: Hyperlipidemia No date: Left ovarian cyst No date: Osteoporosis     Comment:  by DEXA 2008 2011: S/P colonoscopy     Comment:  [name], polyp removed 11/2015: Stroke  ([place])     Comment:  TIA. no deficits   Reproductive/Obstetrics                             Lab Results  Component Value Date   WBC 5.4 09/14/2019   HGB 13.2 09/14/2019   HCT 41.2 09/14/2019   MCV 96.5 09/14/2019   PLT 221 09/14/2019    Anesthesia Physical  Anesthesia Plan  ASA: II  Anesthesia Plan: Spinal   Post-op Pain Management:    Induction:   PONV Risk Score and Plan: 2 and Ondansetron and Propofol infusion  Airway Management Planned: Natural Airway  Additional Equipment:   Intra-op Plan:   Post-operative Plan:   Informed Consent: I have reviewed the patients History and Physical, chart, labs and discussed the procedure including the risks, benefits and alternatives for the proposed anesthesia with the patient or authorized representative who has indicated his/her understanding and acceptance.     Dental advisory given  Plan Discussed with: CRNA and Anesthesiologist  Anesthesia Plan Comments:         Anesthesia Quick Evaluation

## 2019-09-26 NOTE — Progress Notes (Signed)
Physical Therapy Treatment Patient Details Name: Karen Parsons MRN: [id number] DOB: [birth year] Today's Date: 09/26/2019    History of Present Illness Pt admitted for L TKR. HIstory includes HTN, Hernia, and TIA. Previous history of R TKR.    PT Comments    Pt is making good progress towards with ambulation and able to perform stair training. Cued for technique and good endurance with there-ex. Pt eager and motivated to perform therapy. Will continue to progress as able.   Follow Up Recommendations  Home health PT     Equipment Recommendations  None recommended by PT    Recommendations for Other Services       Precautions / Restrictions Precautions Precautions: Fall;Knee Precaution Booklet Issued: Yes (comment) Restrictions Weight Bearing Restrictions: Yes LLE Weight Bearing: Weight bearing as tolerated    Mobility  Bed Mobility Overal bed mobility: Needs Assistance       Supine to sit: Min guard     General bed mobility comments: needs slight assist for raising B LE up onto bed.  Transfers Overall transfer level: Needs assistance Equipment used: Rolling walker (2 wheeled) Transfers: Sit to/from Stand Sit to Stand: Supervision         General transfer comment: safe technique with upright posture  Ambulation/Gait Ambulation/Gait assistance: Min guard Gait Distance (Feet): 250 Feet Assistive device: Rolling walker (2 wheeled) Gait Pattern/deviations: Step-through pattern     General Gait Details: cued for reciprocal gait pattern. Able to use RW safely.   Stairs Stairs: Yes Stairs assistance: Supervision Stair Management: Two rails Number of Stairs: 4 General stair comments: Demonstrated prior to performance. Able to perform safely demonstrated railing as grab bars by reaching for higher surface on railing.   Wheelchair Mobility    Modified Rankin (Stroke Patients Only)       Balance Overall balance assessment: Needs assistance Sitting-balance  support: Feet supported Sitting balance-Leahy Scale: Good     Standing balance support: Bilateral upper extremity supported Standing balance-Leahy Scale: Good                              Cognition Arousal/Alertness: Awake/alert Behavior During Therapy: WFL for tasks assessed/performed Overall Cognitive Status: Within Functional Limits for tasks assessed                                        Exercises Total Joint Exercises Goniometric ROM: L knee AAROM: 0-75 degrees Other Exercises Other Exercises: supine ther-ex performed on L LE including AP, quad sets, SLRs, SAQ, heel slides, and hip abd/add. 12 reps performed with supervision Other Exercises: ambulated to bathroom with cga. Supervision with hygiene. No unsteadiness noted    General Comments        Pertinent Vitals/Pain Pain Assessment: 0-10 Pain Score: 1  Pain Location: L knee Pain Descriptors / Indicators: Operative site guarding Pain Intervention(s): Limited activity within patient's tolerance    Home Living                      Prior Function            PT Goals (current goals can now be found in the care plan section) Acute Rehab PT Goals Patient Stated Goal: go home today! PT Goal Formulation: With patient Time For Goal Achievement: 10/09/19 Potential to Achieve Goals: Good Progress towards PT goals: Progressing toward goals  Frequency    BID      PT Plan Current plan remains appropriate    Co-evaluation              AM-PAC PT "6 Clicks" Mobility   Outcome Measure  Help needed turning from your back to your side while in a flat bed without using bedrails?: A Little Help needed moving from lying on your back to sitting on the side of a flat bed without using bedrails?: A Little Help needed moving to and from a bed to a chair (including a wheelchair)?: A Little Help needed standing up from a chair using your arms (e.g., wheelchair or bedside chair)?:  A Little Help needed to walk in hospital room?: A Little Help needed climbing 3-5 steps with a railing? : A Little 6 Click Score: 18    End of Session Equipment Utilized During Treatment: Gait belt Activity Tolerance: Patient tolerated treatment well Patient left: in bed;with bed alarm set Nurse Communication: Mobility status PT Visit Diagnosis: Muscle weakness (generalized) (M62.81);Difficulty in walking, not elsewhere classified (R26.2);Pain Pain - Right/Left: Left Pain - part of body: Knee     Time: [id number] PT Time Calculation (min) (ACUTE ONLY): 32 min  Charges:  $Gait Training: 8-22 mins $Therapeutic Exercise: 8-22 mins $Therapeutic Activity: 8-22 mins                     Greggory Stallion, PT, DPT [phone]    Karen Parsons 09/26/2019, 2:50 PM

## 2019-09-26 NOTE — Progress Notes (Signed)
  Subjective: 1 Day Post-Op Procedure(s) (LRB): COMPUTER ASSISTED TOTAL KNEE ARTHROPLASTY (Left) Patient reports pain as well-controlled.   Patient is well, and has had no acute complaints or problems Plan is to go Home after hospital stay. Negative for chest pain and shortness of breath Fever: no Gastrointestinal: negative for nausea and vomiting.  Patient has not had a bowel movement.  Objective: Vital signs in last 24 hours: Temp:  [97 F (36.1 C)-98.1 F (36.7 C)] 97.4 F (36.3 C) (04/06 0544) Pulse Rate:  [54-60] 55 (04/06 0544) Resp:  [11-20] 16 (04/05 1955) BP: (110-161)/(70-92) 110/72 (04/06 0544) SpO2:  [93 %-100 %] 96 % (04/06 0138) Weight:  [52.6 kg] 52.6 kg (04/05 1425)  Intake/Output from previous day:  Intake/Output Summary (Last 24 hours) at 09/26/2019 0800 Last data filed at 09/26/2019 0300 Gross per 24 hour  Intake 2573.24 ml  Output 750 ml  Net 1823.24 ml    Intake/Output this shift: No intake/output data recorded.  Labs: No results for input(s): HGB in the last 72 hours. No results for input(s): WBC, RBC, HCT, PLT in the last 72 hours. No results for input(s): NA, K, CL, CO2, BUN, CREATININE, GLUCOSE, CALCIUM in the last 72 hours. No results for input(s): LABPT, INR in the last 72 hours.   EXAM General - Patient is Alert, Appropriate and Oriented Extremity - Neurovascular intact Dorsiflexion/Plantar flexion intact Compartment soft Dressing/Incision -Postoperative dressing remains in place., Polar Care in place and working. , Hemovac in place. Polar Care not set up properly when physician rounded. Motor Function - intact, moving foot and toes well on exam.  Cardiovascular- Regular rate and rhythm, no murmurs/rubs/gallops Respiratory- Lungs clear to auscultation bilaterally Gastrointestinal- soft, nontender and active bowel sounds   Assessment/Plan: 1 Day Post-Op Procedure(s) (LRB): COMPUTER ASSISTED TOTAL KNEE ARTHROPLASTY (Left) Active  Problems:   Total knee replacement status  Estimated body mass index is 21.56 kg/m as calculated from the following:   Height as of this encounter: 5' 1.5" (1.562 m).   Weight as of this encounter: 52.6 kg. Advance diet Up with therapy Will reassess later today for potential discharge home today.   DVT Prophylaxis - Lovenox, Ted hose and foot pumps Weight-Bearing as tolerated to left leg  Cassell Smiles, PA-C [facility] 09/26/2019, 8:00 AM

## 2019-09-26 NOTE — TOC Initial Note (Signed)
Transition of Care [facility]) - Initial/Assessment Note    Patient Details  Name: Karen Parsons MRN: [id number] Date of Birth: 1941/03/30  [facility] CM/SW Contact:    Su Hilt, RN Phone Number: 09/26/2019, 2:12 PM  Clinical Narrative:                  Met with the patient at the bedside to discuss DC plan and needs She has a RW and a raised toilet at home She is set up with [facility] from Dr office prior to Surgery, She does not need additional DME at home, She wants to use [facility] for [facility] services, I notified Helene Kelp Will continue to monitor for needs Expected Discharge Plan: [place] Barriers to Discharge: Continued Medical Work up   Patient Goals and CMS Choice Patient states their goals for this hospitalization and ongoing recovery are:: go home      Expected Discharge Plan and Services Expected Discharge Plan: [place]   Discharge Planning Services: CM Consult   Living arrangements for the past 2 months: [place]                 DME Arranged: N/A         HH Arranged: PT HH Agency: [facility] (formerly [employer]) Date [place]: 09/26/19 Time HH Agency Contacted: 41 Representative spoke with at [place]: [facility]  Prior Living Arrangements/Services Living arrangements for the past 2 months: [place] Lives with:: Self Patient language and need for interpreter reviewed:: Yes Do you feel safe going back to the place where you live?: Yes      Need for Family Participation in Patient Care: No (Comment) Care giver support system in place?: Yes (comment) Current home services: DME(RW, raised toilet) Criminal Activity/Legal Involvement Pertinent to Current Situation/Hospitalization: No - Comment as needed  Activities of Daily Living Home Assistive Devices/Equipment: Eyeglasses, Environmental consultant (specify type), Cane (specify quad or straight) ADL Screening (condition at  time of admission) Patient's cognitive ability adequate to safely complete daily activities?: Yes Is the patient deaf or have difficulty hearing?: No Does the patient have difficulty seeing, even when wearing glasses/contacts?: No Does the patient have difficulty concentrating, remembering, or making decisions?: No Patient able to express need for assistance with ADLs?: Yes Does the patient have difficulty dressing or bathing?: No Independently performs ADLs?: Yes (appropriate for developmental age) Does the patient have difficulty walking or climbing stairs?: No Weakness of Legs: None Weakness of Arms/Hands: None  Permission Sought/Granted   Permission granted to share information with : Yes, Verbal Permission Granted              Emotional Assessment Appearance:: Appears stated age Attitude/Demeanor/Rapport: Engaged Affect (typically observed): Appropriate Orientation: : Oriented to Situation, Oriented to  Time, Oriented to Place, Oriented to Self Alcohol / Substance Use: Not Applicable Psych Involvement: No (comment)  Admission diagnosis:  Total knee replacement status [Z96.659] Patient Active Problem List   Diagnosis Date Noted  . 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 B12 deficiency 05/24/2019  . Primary osteoarthritis of left knee 04/16/2019  . Pseudophakia of right eye 07/26/2017  . S/P total knee arthroplasty 05/10/2017  . Vitamin D deficiency, unspecified 12/10/2016  . CVA (cerebral infarction) 12/18/2015  . Acute renal insufficiency 12/18/2015  . Hyperglycemia 12/18/2015  . Tremor 12/18/2015  . Transient cerebral ischemic attack 12/18/2015  . Aortic atherosclerosis ([place]) 08/01/2015  . Bilateral renal  cysts 08/01/2015  . Generalized osteoarthritis 08/01/2015  . Left ovarian cyst 08/01/2015  . Thumb pain 07/29/2015  . Acute diverticulitis 07/01/2015  . Adnexal mass 07/01/2015  . Underweight 11/10/2014  . Muscle cramps at night 11/10/2014  . Complaints  of leg weakness 11/10/2014  . Medicare annual wellness visit, subsequent 04/18/2014  . Prolapse of female pelvic organs 07/11/2013  . Screening for breast cancer 10/29/2012  . Tubular adenoma of colon 10/29/2012  . Familial multiple lipoprotein-type hyperlipidemia   . GERD (gastroesophageal reflux disease)   . S/P colonoscopy   . Barrett esophagus    PCP:  Adin Hector, MD Pharmacy:   [id number] [address], [place] [address] Phone: [phone] Fax: [phone]  [place], [place] [address] Phone: [phone] Fax: [phone]     Social Determinants of Health (SDOH) Interventions    Readmission Risk Interventions No flowsheet data found.

## 2019-09-26 NOTE — Anesthesia Postprocedure Evaluation (Signed)
Anesthesia Post Note  Patient: Pamala Manke  Procedure(s) Performed: COMPUTER ASSISTED TOTAL KNEE ARTHROPLASTY (Left Knee)  Patient location during evaluation: Nursing Unit Anesthesia Type: Combined General/Spinal Level of consciousness: oriented and awake and alert Pain management: pain level controlled Vital Signs Assessment: post-procedure vital signs reviewed and stable Respiratory status: spontaneous breathing and respiratory function stable Cardiovascular status: blood pressure returned to baseline and stable Postop Assessment: no headache, no backache, no apparent nausea or vomiting and patient able to bend at knees Anesthetic complications: no     Last Vitals:  Vitals:   09/26/19 0544 09/26/19 0806  BP: 110/72 124/73  Pulse: (!) 55 62  Resp:    Temp: (!) 36.3 C (!) 36.3 C  SpO2:  96%    Last Pain:  Vitals:   09/26/19 0806  TempSrc: Oral  PainSc:                  Jerrye Noble

## 2019-09-26 NOTE — Progress Notes (Signed)
Physical Therapy Treatment Patient Details Name: Karen Parsons MRN: [id number] DOB: 01-14-1941 Today's Date: 09/26/2019    History of Present Illness Pt admitted for L TKR. HIstory includes HTN, Hernia, and TIA. Previous history of R TKR.    PT Comments    Pt is making great progress towards goals with ability to ambulate around RN station using RW safely. Good endurance with there-ex and progress with AAROM. Pt very motivated to perform therapy. Will continue to progress as able. Plan for stair training in PM.   Follow Up Recommendations  Home health PT     Equipment Recommendations  None recommended by PT    Recommendations for Other Services       Precautions / Restrictions Precautions Precautions: Fall;Knee Precaution Booklet Issued: Yes (comment) Restrictions Weight Bearing Restrictions: Yes LLE Weight Bearing: Weight bearing as tolerated    Mobility  Bed Mobility Overal bed mobility: Needs Assistance Bed Mobility: Supine to Sit     Supine to sit: Modified independent (Device/Increase time);Supervision     General bed mobility comments: not performed as pt received in chair  Transfers Overall transfer level: Needs assistance Equipment used: Rolling walker (2 wheeled) Transfers: Sit to/from Stand Sit to Stand: Supervision         General transfer comment: safe technique with upright posture  Ambulation/Gait Ambulation/Gait assistance: Min guard Gait Distance (Feet): 200 Feet Assistive device: Rolling walker (2 wheeled) Gait Pattern/deviations: Step-through pattern     General Gait Details: cued for reciprocal gait pattern and to relax shoulders to demonstrate upright posture. Safe technique and able to carry conversation with exertion   Stairs             Wheelchair Mobility    Modified Rankin (Stroke Patients Only)       Balance Overall balance assessment: Needs assistance Sitting-balance support: Feet supported Sitting balance-Leahy  Scale: Good     Standing balance support: Bilateral upper extremity supported Standing balance-Leahy Scale: Good                              Cognition Arousal/Alertness: Awake/alert Behavior During Therapy: WFL for tasks assessed/performed Overall Cognitive Status: Within Functional Limits for tasks assessed                                 General Comments: intermittent cues for safety, pt eager to get up and move, not really impulsive      Exercises Total Joint Exercises Goniometric ROM: L knee AAROM: 0-75 degrees Other Exercises Other Exercises: seated ther-ex performed on L LE including AP, quad sets, SLRs, SAQ, and hip abd/add and knee flexion. 12 reps performed with supervision Other Exercises: ambulated to bathroom with cga. Supervision with hygiene. No unsteadiness noted Other Exercises: Pt/friend instructed in falls prevention, compression stocking mgt, polar care mgt, AE/DME, home/routines modifications for ADL tasks, and safety measures with bed mobility and ADL transfers to minimize risk of lightheadedness/falls    General Comments        Pertinent Vitals/Pain Pain Assessment: 0-10 Pain Score: 1  Pain Location: L knee Pain Descriptors / Indicators: Operative site guarding Pain Intervention(s): Limited activity within patient's tolerance;Ice applied    Home Living Family/patient expects to be discharged to:: Private residence Living Arrangements: Alone Available Help at Discharge: Family(has boyfriend who can stay as needed) Type of Home: House Home Access: Stairs to enter Entrance Stairs-Rails: None(has  grab bars available) Home Layout: One level Home Equipment: Wickes - 2 wheels;Cane - single point(elevated toilet)      Prior Function Level of Independence: Independent      Comments: active prior to baseline, denies falls   PT Goals (current goals can now be found in the care plan section) Acute Rehab PT Goals Patient Stated  Goal: go home today! PT Goal Formulation: With patient Time For Goal Achievement: 10/09/19 Potential to Achieve Goals: Good Progress towards PT goals: Progressing toward goals    Frequency    BID      PT Plan Current plan remains appropriate    Co-evaluation              AM-PAC PT "6 Clicks" Mobility   Outcome Measure  Help needed turning from your back to your side while in a flat bed without using bedrails?: A Little Help needed moving from lying on your back to sitting on the side of a flat bed without using bedrails?: A Little Help needed moving to and from a bed to a chair (including a wheelchair)?: A Little Help needed standing up from a chair using your arms (e.g., wheelchair or bedside chair)?: A Little Help needed to walk in hospital room?: A Little Help needed climbing 3-5 steps with a railing? : A Little 6 Click Score: 18    End of Session Equipment Utilized During Treatment: Gait belt Activity Tolerance: Patient tolerated treatment well Patient left: in chair;with chair alarm set Nurse Communication: Mobility status PT Visit Diagnosis: Muscle weakness (generalized) (M62.81);Difficulty in walking, not elsewhere classified (R26.2);Pain Pain - Right/Left: Left Pain - part of body: Knee     Time: 1014-1100 PT Time Calculation (min) (ACUTE ONLY): 46 min  Charges:  $Gait Training: 8-22 mins $Therapeutic Exercise: 8-22 mins $Therapeutic Activity: 8-22 mins                     Greggory Stallion, PT, DPT [phone]    Karen Parsons 09/26/2019, 1:08 PM

## 2019-09-26 NOTE — Evaluation (Signed)
Occupational Therapy Evaluation Patient Details Name: Karen Parsons MRN: [id number] DOB: Mar 12, 1941 Today's Date: 09/26/2019    History of Present Illness Pt admitted for L TKR. HIstory includes HTN, Hernia, and TIA. Previous history of R TKR.   Clinical Impression   Pt seen for OT evaluation this date, POD#1 from above surgery. Pt was independent in all ADL prior to surgery and is eager to return to PLOF with less pain and improved safety and independence. Pt currently requires PRN minimal assist for LB ADL and SBA-CGA for ADL transfers and functional mobility decreased strength and AROM of L knee. Pt/friend instructed in polar care mgt, falls prevention strategies, home/routines modifications, DME/AE for LB bathing and dressing tasks, compression stocking mgt, and strategies for minimize risk of lightheadedness with functional mobility. Pt/friend verbalized understanding; handout provided to support recall and carryover. Do not currently anticipate additional skilled OT needs at this time. Will discharge in house. Please re-consult if additional needs arise.       Follow Up Recommendations  No OT follow up    Equipment Recommendations  Tub/shower seat    Recommendations for Other Services       Precautions / Restrictions Precautions Precautions: Fall;Knee Precaution Booklet Issued: Yes (comment) Restrictions Weight Bearing Restrictions: Yes LLE Weight Bearing: Weight bearing as tolerated      Mobility Bed Mobility Overal bed mobility: Needs Assistance Bed Mobility: Supine to Sit     Supine to sit: Modified independent (Device/Increase time);Supervision     General bed mobility comments: no difficulty, supervision for safety  Transfers Overall transfer level: Needs assistance Equipment used: Rolling walker (2 wheeled) Transfers: Sit to/from Stand Sit to Stand: Supervision;Min guard         General transfer comment: close SBA for transfer, cues to scoot forward EOB  prior to lift off, initially mild lightheadedness upon standing, resolved quickly    Balance Overall balance assessment: Needs assistance Sitting-balance support: Feet supported Sitting balance-Leahy Scale: Good     Standing balance support: Bilateral upper extremity supported Standing balance-Leahy Scale: Good                             ADL either performed or assessed with clinical judgement   ADL Overall ADL's : Needs assistance/impaired                                       General ADL Comments: PRN Min A for LB ADL tasks, SBA-CGA for ADL transfers for safety     Vision Baseline Vision/History: Wears glasses Wears Glasses: Reading only Patient Visual Report: No change from baseline       Perception     Praxis      Pertinent Vitals/Pain Pain Assessment: No/denies pain     Hand Dominance Right   Extremity/Trunk Assessment Upper Extremity Assessment Upper Extremity Assessment: Overall WFL for tasks assessed   Lower Extremity Assessment Lower Extremity Assessment: (expected post-op strength/ROM deficits to L knee)   Cervical / Trunk Assessment Cervical / Trunk Assessment: Normal   Communication Communication Communication: No difficulties   Cognition Arousal/Alertness: Awake/alert Behavior During Therapy: WFL for tasks assessed/performed Overall Cognitive Status: Within Functional Limits for tasks assessed  General Comments: intermittent cues for safety, pt eager to get up and move, not really impulsive   General Comments       Exercises Other Exercises Other Exercises: Pt/friend instructed in falls prevention, compression stocking mgt, polar care mgt, AE/DME, home/routines modifications for ADL tasks, and safety measures with bed mobility and ADL transfers to minimize risk of lightheadedness/falls   Shoulder Instructions      Home Living Family/patient expects to be discharged  to:: Private residence Living Arrangements: Alone Available Help at Discharge: Family(has boyfriend who can stay as needed) Type of Home: House Home Access: Stairs to enter [employer] of Steps: 2 Entrance Stairs-Rails: None(has grab bars available) Home Layout: One level     Bathroom Shower/Tub: Occupational psychologist: Handicapped height     Home Equipment: Environmental consultant - 2 wheels;Cane - single point(elevated toilet)          Prior Functioning/Environment Level of Independence: Independent        Comments: active prior to baseline, denies falls        OT Problem List: Decreased strength;Decreased range of motion      OT Treatment/Interventions:      OT Goals(Current goals can be found in the care plan section) Acute Rehab OT Goals Patient Stated Goal: go home today! OT Goal Formulation: All assessment and education complete, DC therapy  OT Frequency:     Barriers to D/C:            Co-evaluation              AM-PAC OT "6 Clicks" Daily Activity     Outcome Measure Help from another person eating meals?: None Help from another person taking care of personal grooming?: None Help from another person toileting, which includes using toliet, bedpan, or urinal?: A Little Help from another person bathing (including washing, rinsing, drying)?: A Little Help from another person to put on and taking off regular upper body clothing?: None Help from another person to put on and taking off regular lower body clothing?: A Little 6 Click Score: 21   End of Session Equipment Utilized During Treatment: Gait belt;Rolling walker  Activity Tolerance: Patient tolerated treatment well Patient left: in chair;with call bell/phone within reach;with chair alarm set;with nursing/sitter in room;with family/visitor present;Other (comment)(polar care in place)  OT Visit Diagnosis: Other abnormalities of gait and mobility (R26.89)                Time: [id number] OT  Time Calculation (min): 43 min Charges:  OT General Charges $OT Visit: 1 Visit OT Evaluation $OT Eval Moderate Complexity: 1 Mod OT Treatments $Self Care/Home Management : 23-37 mins  Jeni Salles, MPH, MS, OTR/L ascom [phone] 09/26/19, 9:45 AM

## 2019-09-27 ENCOUNTER — Other Ambulatory Visit: Payer: Self-pay

## 2019-09-27 DIAGNOSIS — Z87891 Personal history of nicotine dependence: Secondary | ICD-10-CM | POA: Insufficient documentation

## 2019-09-27 DIAGNOSIS — R55 Syncope and collapse: Secondary | ICD-10-CM | POA: Insufficient documentation

## 2019-09-27 DIAGNOSIS — Z79899 Other long term (current) drug therapy: Secondary | ICD-10-CM | POA: Insufficient documentation

## 2019-09-27 LAB — BASIC METABOLIC PANEL
Anion gap: 5 (ref 5–15)
BUN: 21 mg/dL (ref 8–23)
CO2: 29 mmol/L (ref 22–32)
Calcium: 8.7 mg/dL — ABNORMAL LOW (ref 8.9–10.3)
Chloride: 107 mmol/L (ref 98–111)
Creatinine, Ser: 0.89 mg/dL (ref 0.44–1.00)
GFR calc Af Amer: >60 mL/min (ref >60)
GFR calc non Af Amer: >60 mL/min (ref >60)
Glucose, Bld: 125 mg/dL — ABNORMAL HIGH (ref 70–99)
Potassium: 4.3 mmol/L (ref 3.5–5.1)
Sodium: 141 mmol/L (ref 135–145)

## 2019-09-27 LAB — CBC
HCT: 31.4 % — ABNORMAL LOW (ref 36.0–46.0)
Hemoglobin: 10.1 g/dL — ABNORMAL LOW (ref 12.0–15.0)
MCH: 31.2 pg (ref 26.0–34.0)
MCHC: 32.2 g/dL (ref 30.0–36.0)
MCV: 96.9 fL (ref 80.0–100.0)
Platelets: 173 10*3/uL (ref 150–400)
RBC: 3.24 MIL/uL — ABNORMAL LOW (ref 3.87–5.11)
RDW: 15.2 % (ref 11.5–15.5)
WBC: 6.3 10*3/uL (ref 4.0–10.5)
nRBC: 0 % (ref 0.0–0.2)

## 2019-09-27 LAB — TROPONIN I (HIGH SENSITIVITY): Troponin I (High Sensitivity): 4 ng/L (ref <18)

## 2019-09-27 MED ORDER — ENOXAPARIN SODIUM 40 MG/0.4ML ~~LOC~~ SOLN: 40.0000 mg | SUBCUTANEOUS | 0 refills | Status: DC

## 2019-09-27 MED ORDER — OXYCODONE HCL 5 MG PO TABS: 5.0000 mg | ORAL_TABLET | ORAL | 0 refills | Status: DC | PRN

## 2019-09-27 MED ORDER — TRAMADOL HCL 50 MG PO TABS: 50.0000 mg | ORAL_TABLET | ORAL | 0 refills | Status: DC | PRN

## 2019-09-27 MED ORDER — CELECOXIB 200 MG PO CAPS: 200.0000 mg | ORAL_CAPSULE | Freq: Two times a day (BID) | ORAL | 0 refills | Status: DC

## 2019-09-27 NOTE — ED Triage Notes (Addendum)
Pt arrives to ED via [facility] from home with c/o dizziness, syncope, and fall. Pt reports having knee replacement surgery today and was d/c'd. Pt reports standing at the sink when she felt sudden onset of dizziness and experienced a syncopal event. Pt does not remember LOC; unsure of any head or neck injury, pt denies any c/o head or neck pain/tenderness. EMS reports pt was unresponsive for 3-4 minutes with a loss of bladder control. Pt is A&O, in NAD; RR even, regular, and unlabored. Note: pt does report similar occurrence related to a TIA in 2013.

## 2019-09-27 NOTE — Progress Notes (Signed)
  Subjective: 2 Days Post-Op Procedure(s) (LRB): COMPUTER ASSISTED TOTAL KNEE ARTHROPLASTY (Left) Patient reports pain as well-controlled.   Patient is well, and has had no acute complaints or problems Plan is to go Home after hospital stay. Negative for chest pain and shortness of breath Fever: no Gastrointestinal: negative for nausea and vomiting.  Patient has had a bowel movement.  Objective: Vital signs in last 24 hours: Temp:  [97.4 F (36.3 C)-97.9 F (36.6 C)] 97.9 F (36.6 C) (04/06 2328) Pulse Rate:  [58-63] 63 (04/06 2328) Resp:  [17] 17 (04/06 2328) BP: (93-131)/(66-89) 93/66 (04/06 2328) SpO2:  [96 %-99 %] 97 % (04/06 2328)  Intake/Output from previous day:  Intake/Output Summary (Last 24 hours) at 09/27/2019 0746 Last data filed at 09/27/2019 0710 Gross per 24 hour  Intake 240 ml  Output 71 ml  Net 169 ml    Intake/Output this shift: Total I/O In: -  Out: 70 [Drains:70]  Labs: No results for input(s): HGB in the last 72 hours. No results for input(s): WBC, RBC, HCT, PLT in the last 72 hours. No results for input(s): NA, K, CL, CO2, BUN, CREATININE, GLUCOSE, CALCIUM in the last 72 hours. No results for input(s): LABPT, INR in the last 72 hours.   EXAM General - Patient is Alert, Appropriate and Oriented Extremity - Neurovascular intact Dorsiflexion/Plantar flexion intact Compartment soft Dressing/Incision -Postoperative dressing remains in place., Polar Care in place and working. , Hemovac in place.  Minimal sanguinous drainage noted. Motor Function - intact, moving foot and toes well on exam.  Cardiovascular- Regular rate and rhythm, no murmurs/rubs/gallops Respiratory- Faint crackles heard in bilateral bases.  Lungs otherwise clear to auscultation. Gastrointestinal- soft and nontender   Assessment/Plan: 2 Days Post-Op Procedure(s) (LRB): COMPUTER ASSISTED TOTAL KNEE ARTHROPLASTY (Left) Active Problems:   Total knee replacement status  Estimated  body mass index is 21.56 kg/m as calculated from the following:   Height as of this encounter: 5' 1.5" (1.562 m).   Weight as of this encounter: 52.6 kg. Up with therapy Discharge home with home health  Honeycomb dressing replaced.  Hemovac drain removed.  DVT Prophylaxis - Lovenox Weight-Bearing as tolerated to left leg  Cassell Smiles, PA-C [facility] Orthopaedic Surgery 09/27/2019, 7:46 AM

## 2019-09-27 NOTE — Progress Notes (Signed)
Patient left with dishcarge instructions IV removed from Right wrist . Ted hose placed , VSS transported safely to personal vehicle with [facility]

## 2019-09-27 NOTE — Progress Notes (Signed)
Physical Therapy Treatment Patient Details Name: Karen Parsons MRN: [id number] DOB: 01/19/1941 Today's Date: 09/27/2019    History of Present Illness Pt admitted for L TKR. HIstory includes HTN, Hernia, and TIA. Previous history of R TKR.    PT Comments    Pt is making good progress towards goals with ability to ambulate around RN station with fluid gait pattern. Upright posture noted. REviewed written HEP and all questions addressed. Good progress with ROM. Pt has met all goals, RN notified and is ready for dc home this date.   Follow Up Recommendations  Home health PT     Equipment Recommendations  None recommended by PT    Recommendations for Other Services       Precautions / Restrictions Precautions Precautions: Fall;Knee Precaution Booklet Issued: Yes (comment) Restrictions Weight Bearing Restrictions: Yes LLE Weight Bearing: Weight bearing as tolerated    Mobility  Bed Mobility Overal bed mobility: Needs Assistance Bed Mobility: Supine to Sit     Supine to sit: Modified independent (Device/Increase time)     General bed mobility comments: safe technique using bed rail. ONce seated at EOB, able to sit with upright posture  Transfers Overall transfer level: Needs assistance Equipment used: Rolling walker (2 wheeled) Transfers: Sit to/from Stand Sit to Stand: Supervision         General transfer comment: safe technique with upright posture  Ambulation/Gait Ambulation/Gait assistance: Supervision Gait Distance (Feet): 220 Feet Assistive device: Rolling walker (2 wheeled) Gait Pattern/deviations: Step-through pattern Gait velocity: 8sec for 10'   General Gait Details: cued for reciprocal gait pattern. Able to use RW safely.   Stairs             Wheelchair Mobility    Modified Rankin (Stroke Patients Only)       Balance Overall balance assessment: Needs assistance Sitting-balance support: Feet supported Sitting balance-Leahy Scale: Good     Standing balance support: Bilateral upper extremity supported Standing balance-Leahy Scale: Good                              Cognition Arousal/Alertness: Awake/alert Behavior During Therapy: WFL for tasks assessed/performed Overall Cognitive Status: Within Functional Limits for tasks assessed                                        Exercises Total Joint Exercises Goniometric ROM: L knee AAROM: 0-95 degrees Other Exercises Other Exercises: supine/seated ther-ex performed on L LE including AP, quad sets, SLRs, LAQ, and seated knee flexion stretches. All ther-ex performed x 15 reps with supervision. Educated on frequency and duration Other Exercises: ambulated to bathroom with cga. Supervision with hygiene. No unsteadiness noted    General Comments        Pertinent Vitals/Pain Pain Assessment: Faces Faces Pain Scale: Hurts little more Pain Location: L knee Pain Descriptors / Indicators: Operative site guarding Pain Intervention(s): Limited activity within patient's tolerance;Ice applied    Home Living                      Prior Function            PT Goals (current goals can now be found in the care plan section) Acute Rehab PT Goals Patient Stated Goal: go home today! PT Goal Formulation: With patient Time For Goal Achievement: 10/09/19 Potential to Achieve Goals:  Good Progress towards PT goals: Progressing toward goals    Frequency    BID      PT Plan Current plan remains appropriate    Co-evaluation              AM-PAC PT "6 Clicks" Mobility   Outcome Measure  Help needed turning from your back to your side while in a flat bed without using bedrails?: None Help needed moving from lying on your back to sitting on the side of a flat bed without using bedrails?: None Help needed moving to and from a bed to a chair (including a wheelchair)?: None Help needed standing up from a chair using your arms (e.g.,  wheelchair or bedside chair)?: None Help needed to walk in hospital room?: A Little Help needed climbing 3-5 steps with a railing? : A Little 6 Click Score: 22    End of Session Equipment Utilized During Treatment: Gait belt Activity Tolerance: Patient tolerated treatment well Patient left: in chair Nurse Communication: Mobility status PT Visit Diagnosis: Muscle weakness (generalized) (M62.81);Difficulty in walking, not elsewhere classified (R26.2);Pain Pain - Right/Left: Left Pain - part of body: Knee     Time: 4068-4033 PT Time Calculation (min) (ACUTE ONLY): 25 min  Charges:  $Gait Training: 8-22 mins $Therapeutic Exercise: 8-22 mins                     Greggory Stallion, PT, DPT [phone]    Demetrious Rainford 09/27/2019, 11:11 AM

## 2019-09-27 NOTE — Discharge Summary (Signed)
Physician Discharge Summary  Patient ID: Karen Parsons MRN: [id number] DOB/AGE: 1941-05-17 79 y.o.  Admit date: 09/25/2019 Discharge date: 09/27/2019  Admission Diagnoses:  Total knee replacement status [Z96.659]  Surgeries:Procedure(s):  Left total knee arthroplasty using computer-assisted navigation  SURGEON:  Marciano Sequin. M.D.  ASSISTANT: Cassell Smiles, PA-C (present and scrubbed throughout the case, critical for assistance with exposure, retraction, instrumentation, and closure)  ANESTHESIA: spinal  ESTIMATED BLOOD LOSS: 50 mL  FLUIDS REPLACED: 1000 mL of crystalloid  TOURNIQUET TIME: 77 minutes  DRAINS: 2 medium Hemovac drains  SOFT TISSUE RELEASES: Anterior cruciate ligament, posterior cruciate ligament, deep medial collateral ligament, patellofemoral ligament  IMPLANTS UTILIZED: DePuy Attune size 6N posterior stabilized femoral component (cemented), size 5 rotating platform tibial component (cemented), 35 mm medialized dome patella (cemented), and a 7 mm stabilized rotating platform polyethylene insert.  Discharge Diagnoses: Patient Active Problem List   Diagnosis Date Noted  . 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 B12 deficiency 05/24/2019  . Primary osteoarthritis of left knee 04/16/2019  . Pseudophakia of right eye 07/26/2017  . S/P total knee arthroplasty 05/10/2017  . Vitamin D deficiency, unspecified 12/10/2016  . CVA (cerebral infarction) 12/18/2015  . Acute renal insufficiency 12/18/2015  . Hyperglycemia 12/18/2015  . Tremor 12/18/2015  . Transient cerebral ischemic attack 12/18/2015  . Aortic atherosclerosis ([place]) 08/01/2015  . Bilateral renal cysts 08/01/2015  . Generalized osteoarthritis 08/01/2015  . Left ovarian cyst 08/01/2015  . Thumb pain 07/29/2015  . Acute diverticulitis 07/01/2015  . Adnexal mass 07/01/2015  . Underweight 11/10/2014  . Muscle cramps at night 11/10/2014  . Complaints of leg weakness 11/10/2014  . Medicare  annual wellness visit, subsequent 04/18/2014  . Prolapse of female pelvic organs 07/11/2013  . Screening for breast cancer 10/29/2012  . Tubular adenoma of colon 10/29/2012  . Familial multiple lipoprotein-type hyperlipidemia   . GERD (gastroesophageal reflux disease)   . S/P colonoscopy   . Barrett esophagus     Past Medical History:  Diagnosis Date  . Barrett esophagus    2018.  dr. Adria Devon said this has resolved after a recent endoscopy  . Barrett's esophagus July 2010   [place],   . Benign neoplasm of colon   . Bilateral renal cysts   . Colon polyps   . Cramp of limb   . Diverticulosis   . Generalized osteoarthritis   . Genital prolapse   . GERD (gastroesophageal reflux disease)   . H/O seasonal allergies   . Hallux valgus   . Hemorrhoids   . History of hiatal hernia   . Hyperlipidemia    intolerant of statins, normal diagnostic cath July 2010 Nehemiah Massed)  . Hyperlipidemia   . Left ovarian cyst   . Osteoporosis    by DEXA 2008  . S/P colonoscopy 2011   [name], polyp removed  . Stroke ([place]) 11/2015   TIA. no deficits     Transfusion:    Consultants (if any):   Discharged Condition: Improved  Hospital Course: Karen Parsons is an 79 y.o. female who was admitted 09/25/2019 with a diagnosis of left knee osteoarthritis and went to the operating room on 09/25/2019 and underwent left total knee arthroplasty. The patient received perioperative antibiotics for prophylaxis (see below). The patient tolerated the procedure well and was transported to PACU in stable condition. After meeting PACU criteria, the patient was subsequently transferred to the Orthopaedics/Rehabilitation unit.   The patient received DVT prophylaxis in the form of early mobilization, Lovenox, Foot Pumps and TED  hose. A sacral pad had been placed and heels were elevated off of the bed with rolled towels in order to protect skin integrity. Foley catheter was discontinued on postoperative day #0. Wound drains were  discontinued on postoperative day #2. The surgical incision was healing well without signs of infection.  Physical therapy was initiated postoperatively for transfers, gait training, and strengthening. Occupational therapy was initiated for activities of daily living and evaluation for assisted devices. Rehabilitation goals were reviewed in detail with the patient. The patient made steady progress with physical therapy and physical therapy recommended discharge to Home.   The patient achieved his preliminary goals of this hospitalization and was felt to be medically and orthopaedically appropriate for discharge.  She was given perioperative antibiotics:  Anti-infectives (From admission, onward)   Start     Dose/Rate Route Frequency Ordered Stop   09/25/19 1330  ceFAZolin (ANCEF) IVPB 2g/100 mL premix     2 g 200 mL/hr over 30 Minutes Intravenous Every 6 hours 09/25/19 1205 09/26/19 0840   09/25/19 0645  ceFAZolin (ANCEF) IVPB 2g/100 mL premix     2 g 200 mL/hr over 30 Minutes Intravenous On call to O.R. 09/25/19 0630 09/25/19 0745   09/25/19 0618  ceFAZolin (ANCEF) 2-4 GM/100ML-% IVPB    Note to Pharmacy: Ronnell Freshwater   : cabinet override      09/25/19 0618 09/25/19 2256    .  Recent vital signs:  Vitals:   09/26/19 1656 09/26/19 2328  BP: 131/89 93/66  Pulse: (!) 58 63  Resp:  17  Temp: 97.7 F (36.5 C) 97.9 F (36.6 C)  SpO2: 99% 97%    Recent laboratory studies:  No results for input(s): WBC, HGB, HCT, PLT, K, CL, CO2, BUN, CREATININE, GLUCOSE, CALCIUM, LABPT, INR in the last 72 hours.  Diagnostic Studies: DG Knee Left Port  Result Date: 09/25/2019 CLINICAL DATA:  Status post left knee replacement today. EXAM: PORTABLE LEFT KNEE - 1-2 VIEW COMPARISON:  None. FINDINGS: Left knee arthroplasty is in place. The device is located. No fracture. Surgical drains and staples noted. IMPRESSION: Status post left knee replacement.  No acute abnormality. Electronically Signed   By:  Inge Rise M.D.   On: 09/25/2019 11:10    Discharge Medications:   Allergies as of 09/27/2019      Reactions   Sulfamethoxazole-trimethoprim Nausea And Vomiting, Other (See Comments)   "Shaking all over". (Bactrim)   Ezetimibe Other (See Comments)   Muscle Pain   Statins Other (See Comments)   Muscle Pain   Welchol [colesevelam] Other (See Comments)   Muscle Pain       Medication List    STOP taking these medications   aspirin EC 81 MG tablet     TAKE these medications   acetaminophen 500 MG tablet Commonly known as: TYLENOL Take 500-1,000 mg by mouth daily as needed for moderate pain.   amoxicillin 500 MG capsule Commonly known as: AMOXIL Take 2,000 mg by mouth See admin instructions. Take 2000 mg by mouth 1 hour prior to dental procedure   celecoxib 200 MG capsule Commonly known as: CELEBREX Take 1 capsule (200 mg total) by mouth 2 (two) times daily.   cholecalciferol 25 MCG (1000 UNIT) tablet Commonly known as: VITAMIN D3 Take 2,000 Units by mouth daily.   diclofenac sodium 1 % Gel Commonly known as: VOLTAREN Apply 1 application topically daily as needed (arthritis pain).   enoxaparin 40 MG/0.4ML injection Commonly known as: LOVENOX Inject 0.4 mLs (  40 mg total) into the skin daily for 14 days.   ipratropium 0.03 % nasal spray Commonly known as: ATROVENT Place 1 spray into both nostrils 2 (two) times daily.   melatonin 5 MG Tabs Take 5 mg by mouth at bedtime as needed.   memantine 5 MG tablet Commonly known as: NAMENDA Take 5 mg by mouth 2 (two) times daily.   omeprazole 20 MG capsule Commonly known as: PRILOSEC Take 20 mg by mouth 2 (two) times daily before a meal.   oxyCODONE 5 MG immediate release tablet Commonly known as: Oxy IR/ROXICODONE Take 1 tablet (5 mg total) by mouth every 4 (four) hours as needed for moderate pain (pain score 4-6).   rosuvastatin 5 MG tablet Commonly known as: CRESTOR Take 5 mg by mouth once a week. ON THURSDAY     traMADol 50 MG tablet Commonly known as: ULTRAM Take 1 tablet (50 mg total) by mouth every 4 (four) hours as needed for moderate pain.   vitamin B-12 1000 MCG tablet Commonly known as: CYANOCOBALAMIN Take 1,000 mcg by mouth daily.            Durable Medical Equipment  (From admission, onward)         Start     Ordered   09/25/19 1206  DME Walker rolling  Once    Question:  Patient needs a walker to treat with the following condition  Answer:  Total knee replacement status   09/25/19 1205   09/25/19 1206  DME Bedside commode  Once    Question:  Patient needs a bedside commode to treat with the following condition  Answer:  Total knee replacement status   09/25/19 1205          Disposition: Home with home health therapy    Follow-up Information    [facility] On 10/10/2019.   Specialty: Orthopedic Surgery Why: at 1:15pm Contact information: [place] [address] [phone]        Dereck Leep, MD On 11/07/2019.   Specialty: Orthopedic Surgery Why: at 2:30pm Contact information: [place] [address] [place], PA-C 09/27/2019, 7:52 AM

## 2019-09-27 NOTE — TOC Transition Note (Signed)
Transition of Care [facility]) - CM/SW Discharge Note   Patient Details  Name: Karen Parsons MRN: [id number] Date of Birth: 1940-09-02  [facility]) CM/SW Contact:  Su Hilt, RN Phone Number: 09/27/2019, 8:40 AM   Clinical Narrative:    The patient will DC home today, she has DME at home and does not need additional, She is set up with [facility] for [facility]. NO additional needs   Final next level of care: Home w Home Health Services Barriers to Discharge: Barriers Resolved   Patient Goals and CMS Choice Patient states their goals for this hospitalization and ongoing recovery are:: go home      Discharge Placement                       Discharge Plan and Services   Discharge Planning Services: CM Consult            DME Arranged: N/A         HH Arranged: PT HH Agency: [facility] (formerly [employer]) Date [place]: 09/26/19 Time [place]: [place] Representative spoke with at [place]: [facility]  Social Determinants of Health ([place]) Interventions     Readmission Risk Interventions No flowsheet data found.

## 2019-09-28 ENCOUNTER — Emergency Department: Payer: Medicare Other

## 2019-09-28 ENCOUNTER — Emergency Department
Admission: EM | Admit: 2019-09-28 | Discharge: 2019-09-28 | Disposition: A | Discharge status: Home / Self Care | Payer: Medicare Other | Attending: Emergency Medicine | Admitting: Emergency Medicine

## 2019-09-28 DIAGNOSIS — R55 Syncope and collapse: Secondary | ICD-10-CM

## 2019-09-28 NOTE — Discharge Instructions (Addendum)
You have been seen today in the Emergency Department (ED)  for syncope (passing out).  Your workup including labs and EKG show reassuring results.  Your symptoms may be due to volume depletion (mild dehydration given your recent surgery), so it is important that you drink plenty of non-alcoholic fluids.  Please call your regular doctor as soon as possible to schedule the next available clinic appointment to follow up with him/her regarding your visit to the ED and your symptoms.  Return to the Emergency Department (ED)  if you have any further syncopal episodes (pass out again) or develop ANY chest pain, pressure, tightness, trouble breathing, sudden sweating, or other symptoms that concern you.

## 2019-09-28 NOTE — ED Notes (Signed)
Possible mild right side facial weakness around mouth

## 2019-09-28 NOTE — ED Provider Notes (Signed)
[facility] Emergency Department Provider Note  ____________________________________________   First MD Initiated Contact with Patient 09/28/19 [phone]     (approximate)  I have reviewed the triage vital signs and the nursing notes.   HISTORY  Chief Complaint Loss of Consciousness and Fall    HPI Karen Parsons is a 79 y.o. female  with medical history as listed below who presents for evaluation after passing out and collapsing at home.  She had knee surgery couple of days ago and just came home from the hospital.  She was feeling a little bit lightheaded sitting at the kitchen table and she got up to walk around and became more lightheaded and then started to collapse but she had a friend nearby who helped her to the floor.  She does not remember losing consciousness but she remembers what happened.  She does not have any head or neck pain.  She denies fever/chills, sore throat, chest pain, shortness of breath, nausea, vomiting, and abdominal pain.  Her knee has been stable since the surgery and is no more painful or less painful than it has been.  The onset of the symptoms was acute and the symptoms were severe but she says she feels completely fine right now.  The only concern was that she had what she was told was a TIA about 8 years ago and the symptoms were similar.  She has no numbness nor weakness in any of her extremities at this time and has had no word finding difficulties.        Past Medical History:  Diagnosis Date  . Barrett esophagus    2018.  dr. Adria Devon said this has resolved after a recent endoscopy  . Barrett's esophagus July 2010   [place],   . Benign neoplasm of colon   . Bilateral renal cysts   . Colon polyps   . Cramp of limb   . Diverticulosis   . Generalized osteoarthritis   . Genital prolapse   . GERD (gastroesophageal reflux disease)   . H/O seasonal allergies   . Hallux valgus   . Hemorrhoids   . History of hiatal hernia   .  Hyperlipidemia    intolerant of statins, normal diagnostic cath July 2010 Nehemiah Massed)  . Hyperlipidemia   . Left ovarian cyst   . Osteoporosis    by DEXA 2008  . S/P colonoscopy 2011   [name], polyp removed  . Stroke ([place]) 11/2015   TIA. no deficits    Patient Active Problem List   Diagnosis Date Noted  . 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 B12 deficiency 05/24/2019  . Primary osteoarthritis of left knee 04/16/2019  . Pseudophakia of right eye 07/26/2017  . S/P total knee arthroplasty 05/10/2017  . Vitamin D deficiency, unspecified 12/10/2016  . CVA (cerebral infarction) 12/18/2015  . Acute renal insufficiency 12/18/2015  . Hyperglycemia 12/18/2015  . Tremor 12/18/2015  . Transient cerebral ischemic attack 12/18/2015  . Aortic atherosclerosis ([place]) 08/01/2015  . Bilateral renal cysts 08/01/2015  . Generalized osteoarthritis 08/01/2015  . Left ovarian cyst 08/01/2015  . Thumb pain 07/29/2015  . Acute diverticulitis 07/01/2015  . Adnexal mass 07/01/2015  . Underweight 11/10/2014  . Muscle cramps at night 11/10/2014  . Complaints of leg weakness 11/10/2014  . Medicare annual wellness visit, subsequent 04/18/2014  . Prolapse of female pelvic organs 07/11/2013  . Screening for breast cancer 10/29/2012  . Tubular adenoma of colon 10/29/2012  . Familial multiple lipoprotein-type hyperlipidemia   .  GERD (gastroesophageal reflux disease)   . S/P colonoscopy   . Barrett esophagus     Past Surgical History:  Procedure Laterality Date  . ABDOMINAL HYSTERECTOMY     age 44, menorrhagia  . APPENDECTOMY    . arthroscopy of knee Right   . CARDIAC CATHETERIZATION Left July 2010   [name]:  normal  . CATARACT EXTRACTION Left 2010   [name]  . COLONOSCOPY WITH PROPOFOL N/A 01/07/2015   Procedure: COLONOSCOPY WITH PROPOFOL;  Surgeon: Manya Silvas, MD;  Location: [facility] ENDOSCOPY;  Service: Endoscopy;  Laterality: N/A;  . ESOPHAGOGASTRODUODENOSCOPY N/A 01/07/2015    Procedure: ESOPHAGOGASTRODUODENOSCOPY (EGD);  Surgeon: Manya Silvas, MD;  Location: [facility] ENDOSCOPY;  Service: Endoscopy;  Laterality: N/A;  . ESOPHAGOGASTRODUODENOSCOPY (EGD) WITH PROPOFOL N/A 05/23/2015   Procedure: ESOPHAGOGASTRODUODENOSCOPY (EGD) WITH PROPOFOL;  Surgeon: Manya Silvas, MD;  Location: [facility] ENDOSCOPY;  Service: Endoscopy;  Laterality: N/A;  . ESOPHAGOGASTRODUODENOSCOPY (EGD) WITH PROPOFOL N/A 07/11/2015   Procedure: ESOPHAGOGASTRODUODENOSCOPY (EGD) WITH PROPOFOL with Barrx;  Surgeon: [name], MD;  Location: [facility] ENDOSCOPY;  Service: Gastroenterology;  Laterality: N/A;  . ESOPHAGOGASTRODUODENOSCOPY (EGD) WITH PROPOFOL N/A 09/12/2015   Procedure: ESOPHAGOGASTRODUODENOSCOPY (EGD) WITH PROPOFOL;  Surgeon: [name], MD;  Location: [facility] ENDOSCOPY;  Service: Gastroenterology;  Laterality: N/A;  . ESOPHAGOGASTRODUODENOSCOPY (EGD) WITH PROPOFOL N/A 11/14/2015   Procedure: ESOPHAGOGASTRODUODENOSCOPY (EGD) WITH PROPOFOL;  Surgeon: [name], MD;  Location: [facility] ENDOSCOPY;  Service: Gastroenterology;  Laterality: N/A;  . EYE SURGERY    . HEMORRHOID SURGERY  10/02/2013   by simple ligation  . JOINT REPLACEMENT    . KNEE ARTHROPLASTY Right 05/10/2017   Procedure: COMPUTER ASSISTED TOTAL KNEE ARTHROPLASTY;  Surgeon: Dereck Leep, MD;  Location: [facility] ORS;  Service: Orthopedics;  Laterality: Right;  . KNEE ARTHROPLASTY Left 09/25/2019   Procedure: COMPUTER ASSISTED TOTAL KNEE ARTHROPLASTY;  Surgeon: Dereck Leep, MD;  Location: [facility] ORS;  Service: Orthopedics;  Laterality: Left;  . medial meniscotomy Left 2007   [name]  . OSTEOTOMY TOES Right 2008   [name]  . TONSILLECTOMY      Prior to Admission medications   Medication Sig Start Date End Date Taking? Authorizing Provider  acetaminophen (TYLENOL) 500 MG tablet Take 500-1,000 mg by mouth daily as needed for moderate pain.    [provider]  amoxicillin (AMOXIL) 500 MG capsule Take 2,000 mg by mouth See admin  instructions. Take 2000 mg by mouth 1 hour prior to dental procedure 02/13/19   [provider]  celecoxib (CELEBREX) 200 MG capsule Take 1 capsule (200 mg total) by mouth 2 (two) times daily. 09/27/19   Fausto Skillern, PA-C  cholecalciferol (VITAMIN D3) 25 MCG (1000 UNIT) tablet Take 2,000 Units by mouth daily.     [provider]  diclofenac sodium (VOLTAREN) 1 % GEL Apply 1 application topically daily as needed (arthritis pain).    [provider]  enoxaparin (LOVENOX) 40 MG/0.4ML injection Inject 0.4 mLs (40 mg total) into the skin daily for 14 days. 09/27/19 10/11/19  Tamala Julian B, PA-C  ipratropium (ATROVENT) 0.03 % nasal spray Place 1 spray into both nostrils 2 (two) times daily.     [provider]  melatonin 5 MG TABS Take 5 mg by mouth at bedtime as needed.    [provider]  memantine (NAMENDA) 5 MG tablet Take 5 mg by mouth 2 (two) times daily. 07/27/19   [provider]  omeprazole (PRILOSEC) 20 MG capsule Take 20 mg  by mouth 2 (two) times daily before a meal.    [provider]  oxyCODONE (OXY IR/ROXICODONE) 5 MG immediate release tablet Take 1 tablet (5 mg total) by mouth every 4 (four) hours as needed for moderate pain (pain score 4-6). 09/27/19   Tamala Julian B, PA-C  rosuvastatin (CRESTOR) 5 MG tablet Take 5 mg by mouth once a week. ON THURSDAY 06/23/19   [provider]  traMADol (ULTRAM) 50 MG tablet Take 1 tablet (50 mg total) by mouth every 4 (four) hours as needed for moderate pain. 09/27/19   Fausto Skillern, PA-C  vitamin B-12 (CYANOCOBALAMIN) 1000 MCG tablet Take 1,000 mcg by mouth daily.    [provider]    Allergies Sulfamethoxazole-trimethoprim, Ezetimibe, Statins, and Welchol [colesevelam]  Family History  Problem Relation Age of Onset  . Hypertension Mother   . Heart disease Father   . Heart disease Brother   . Breast cancer Neg Hx     Social History Social History   Tobacco  Use  . Smoking status: Former Smoker    Types: Cigarettes    Quit date: 06/22/1968    Years since quitting: 51.3  . Smokeless tobacco: Never Used  Substance Use Topics  . Alcohol use: Yes    Alcohol/week: 3.0 standard drinks    Types: 3 Standard drinks or equivalent per week    Comment: wine x1 a night  . Drug use: No    Review of Systems Constitutional: No fever/chills Eyes: No visual changes. ENT: No sore throat. Cardiovascular: Syncope and collapse.  Denies chest pain. Respiratory: Denies shortness of breath. Gastrointestinal: No abdominal pain.  No nausea, no vomiting.  No diarrhea.  No constipation. Genitourinary: Negative for dysuria. Musculoskeletal: Negative for neck pain.  Negative for back pain. Integumentary: Negative for rash. Neurological: Negative for headaches, focal weakness or numbness.   ____________________________________________   PHYSICAL EXAM:  VITAL SIGNS: ED Triage Vitals  Enc Vitals Group     BP 09/27/19 1938 109/61     Pulse Rate 09/27/19 1938 68     Resp 09/27/19 1938 17     Temp 09/27/19 1938 98.5 F (36.9 C)     Temp Source 09/27/19 1938 Oral     SpO2 09/27/19 1938 98 %     Weight 09/27/19 1931 52.6 kg (116 lb)     Height 09/27/19 1931 1.575 m (5\' 2" )     Head Circumference --      Peak Flow --      Pain Score 09/27/19 1931 2     Pain Loc --      Pain Edu? --      Excl. in [place]? --     Constitutional: Alert and oriented.  Well-appearing, no acute distress, happy and conversant without difficulty. Eyes: Conjunctivae are normal.  Head: Atraumatic. Nose: No congestion/rhinnorhea. Mouth/Throat: Patient is wearing a mask. Neck: No stridor.  No meningeal signs.   Cardiovascular: Normal rate, regular rhythm. Good peripheral circulation. Grossly normal heart sounds. Respiratory: Normal respiratory effort.  No retractions. Gastrointestinal: Soft and nontender. No distention.  Musculoskeletal: Honeycomb bandage in place on left knee from  recent surgery.  There is some dried blood but that was present when she was discharged and it has not gotten any worse.  No clinically significant effusion. Neurologic:  Normal speech and language. No gross focal neurologic deficits are appreciated.  Good bilateral grip strength and upper limb strength.  No gross cranial nerve deficits. Skin:  Skin is warm,  dry and intact. Psychiatric: Mood and affect are normal. Speech and behavior are normal.  ____________________________________________   LABS (all labs ordered are listed, but only abnormal results are displayed)  Labs Reviewed  BASIC METABOLIC PANEL - Abnormal; Notable for the following components:      Result Value   Glucose, Bld 125 (*)    Calcium 8.7 (*)    All other components within normal limits  CBC - Abnormal; Notable for the following components:   RBC 3.24 (*)    Hemoglobin 10.1 (*)    HCT 31.4 (*)    All other components within normal limits  URINALYSIS, COMPLETE (UACMP) WITH MICROSCOPIC  CBG MONITORING, ED  TROPONIN I (HIGH SENSITIVITY)   ____________________________________________  EKG  ED ECG REPORT I, Hinda Kehr, the attending physician, personally viewed and interpreted this ECG.  Date: 09/27/2019 EKG Time: 19: 36 Rate: 71 Rhythm: normal sinus rhythm QRS Axis: normal Intervals: normal ST/T Wave abnormalities: normal Narrative Interpretation: no evidence of acute ischemia  ____________________________________________  RADIOLOGY I, Hinda Kehr, personally viewed and evaluated these images (plain radiographs) as part of my medical decision making, as well as reviewing the written report by the radiologist.  ED MD interpretation: No acute abnormalities identified on head CT.  Official radiology report(s): CT Head Wo Contrast  Result Date: 09/28/2019 CLINICAL DATA:  Dizziness and syncope with subsequent fall. EXAM: CT HEAD WITHOUT CONTRAST TECHNIQUE: Contiguous axial images were obtained from the  base of the skull through the vertex without intravenous contrast. COMPARISON:  December 18, 2015 FINDINGS: Brain: No evidence of acute infarction, hemorrhage, hydrocephalus, extra-axial collection or mass lesion/mass effect. Vascular: No hyperdense vessel or unexpected calcification. Skull: Normal. Negative for fracture or focal lesion. Sinuses/Orbits: No acute finding. Other: None. IMPRESSION: No acute intracranial pathology. Electronically Signed   By: Virgina Norfolk M.D.   On: 09/28/2019 00:29    ____________________________________________   PROCEDURES   Procedure(s) performed (including Critical Care):  Procedures   ____________________________________________   INITIAL IMPRESSION / MDM / [place] / ED COURSE  As part of my medical decision making, I reviewed the following data within the [place] History obtained from family, Nursing notes reviewed and incorporated, Labs reviewed , EKG interpreted , Old chart reviewed and Notes from prior ED visits   Differential diagnosis includes, but is not limited to, orthostatic syncope, vasovagal syncope, medication or drug side effect, volume depletion/dehydration, metabolic or electrolyte abnormality, less likely acute infection.  Patient is well-appearing and in no distress.  Vital signs are stable and within normal limits.  Initial high-sensitivity troponin was 4 and metabolic panel and CBC are all stable and appropriate.  The patient has no focal neurological deficits and a normal head CT.  I believe that this was a transient episode due to everything she has been through recently and decreased oral intake since the surgery.  She is currently asymptomatic and she and her daughter are comfortable going home.  I explained that a TIA was unlikely and that there was no confirmatory test I could perform that would be guaranteed to prove she did not have a TIA (by definition), but I am reassured and do not think her  syncope was due to TIA/CVA.  She is also getting Lovenox post surgery and has the medication at home.  She is comfortable with the plan for discharge and outpatient follow-up and I gave my usual and customary return precautions.          ____________________________________________  FINAL CLINICAL IMPRESSION(S) / ED DIAGNOSES  Final diagnoses:  Syncope and collapse     MEDICATIONS GIVEN DURING THIS VISIT:  Medications - No data to display   ED Discharge Orders    None      *Please note:  Helyn Ozolins was evaluated in Emergency Department on 09/28/2019 for the symptoms described in the history of present illness. She was evaluated in the context of the global COVID-19 pandemic, which necessitated consideration that the patient might be at risk for infection with the SARS-CoV-2 virus that causes COVID-19. Institutional protocols and algorithms that pertain to the evaluation of patients at risk for COVID-19 are in a state of rapid change based on information released by regulatory bodies including the CDC and federal and state organizations. These policies and algorithms were followed during the patient's care in the ED.  Some ED evaluations and interventions may be delayed as a result of limited staffing during the pandemic.*  Note:  This document was prepared using Dragon voice recognition software and may include unintentional dictation errors.   Hinda Kehr, MD 09/28/19 [phone]

## 2020-01-24 ENCOUNTER — Encounter: Payer: Self-pay | Admitting: Podiatry

## 2020-01-24 ENCOUNTER — Ambulatory Visit (INDEPENDENT_AMBULATORY_CARE_PROVIDER_SITE_OTHER): Payer: Medicare Other | Admitting: Podiatry

## 2020-01-24 ENCOUNTER — Other Ambulatory Visit: Payer: Self-pay

## 2020-01-24 DIAGNOSIS — Q828 Other specified congenital malformations of skin: Secondary | ICD-10-CM

## 2020-01-24 DIAGNOSIS — M205X1 Other deformities of toe(s) (acquired), right foot: Secondary | ICD-10-CM | POA: Diagnosis not present

## 2020-01-24 NOTE — Progress Notes (Signed)
She presents today after having not seen her for several months with a chief complaint of painful corn third toe right foot.  She has been bothersome for the past 6 months I cannot stand it any longer it hurts so badly.  Objective: Vital signs are stable alert oriented x3.  Pulses are palpable.  Mildly erythematous severely painful medial aspect of the DIPJ third digit right foot.  There is blood underneath the reactive hyperkeratotic tissue associated with this lesion.  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  This is resulting in this reactive hyperkeratotic tissue that is exquisitely painful.  Assessment: Painful callus medial aspect third toe right foot.  Plan: At this point I placed local anesthetic with some dexamethasone and I debrided the reactive hyperkeratosis today and placed padding.  We also consented her for DIPJ arthroplasty and discussed the possible postop complications which may include but not limited to postop pain bleeding swelling infection recurrence need for further surgery overcorrection under correction also digit loss of limb loss of life.  We dispensed information regarding the surgery center anesthesia group and directions for the morning of surgery also dispensed a Darco shoe I will follow-up with her in the near future for surgical intervention if necessary.

## 2020-04-01 ENCOUNTER — Other Ambulatory Visit: Payer: Self-pay

## 2020-04-01 ENCOUNTER — Encounter: Payer: Self-pay | Admitting: Podiatry

## 2020-04-01 ENCOUNTER — Telehealth: Payer: Self-pay | Admitting: Podiatry

## 2020-04-01 ENCOUNTER — Ambulatory Visit (INDEPENDENT_AMBULATORY_CARE_PROVIDER_SITE_OTHER): Payer: Medicare Other | Admitting: Podiatry

## 2020-04-01 DIAGNOSIS — M205X1 Other deformities of toe(s) (acquired), right foot: Secondary | ICD-10-CM

## 2020-04-01 NOTE — Telephone Encounter (Signed)
Patient would like [name] to call to follow up on a couple questions. She is missing the soap and brush, wants to change pharmacy, etc.

## 2020-04-01 NOTE — Progress Notes (Signed)
She presents today with her husband for a surgical consult regarding her painful toe third digit right foot.  She states that she would like to consider this in greater detail and have a better understanding of it prior to surgery.  Objective: Vital signs are stable she is alert oriented x3.  Pulses are palpable.  There is no erythema edema cellulitis drainage odor painful reactive hyperkeratotic lesion medial aspect third digit right foot hammertoe deformity second right as well as hallux valgus deformity right.  This in juxtaposition is causing the pain to the third toe which is exquisitely painful.  She also has the fourth and fifth digits of the right foot at in abduction.  All this combined is causing pain to the third toe would like to consider having third toe removed rather than just a distal arthroplasty and exostectomy.  Assessment: Painful third digit right foot.  Plan: Discussed etiology pathology conservative versus surgical therapies.  At this point we are going to consent her today for removal of the third digit on the right foot.  She understands this and is amenable to it were going to consent her for amputation of the third toe right foot at the level of the metatarsophalangeal joint we discussed the pros and cons of surgery also discussed possible postop complications which may include but not limited to postop pain bleeding swelling infection recurrence need for further surgery overcorrection under correction loss of digit loss of limb loss of life.  Dispensed a Cam walker and I will follow-up with her in the near future for surgical intervention.

## 2020-04-02 NOTE — Telephone Encounter (Signed)
Returned call-  Told patient she could come by the office and pick one up

## 2020-04-17 ENCOUNTER — Telehealth: Payer: Self-pay

## 2020-04-17 NOTE — Telephone Encounter (Signed)
Ms. Calles is scheduled for surgery on 04/26/2020. She stated she mentioned to Dr. Milinda Pointer that she had a reaction to Oxycodone with a prior surgery but didn't on her last procedure and that she preferred not to have it on her upcoming surgery. She now states that she believes that she will need the Oxycodone for this surgery and requested that I let Dr.Hyatt know.

## 2020-04-24 ENCOUNTER — Other Ambulatory Visit: Payer: Self-pay | Admitting: Podiatry

## 2020-04-24 MED ORDER — OXYCODONE-ACETAMINOPHEN 10-325 MG PO TABS: 1.0000 | ORAL_TABLET | Freq: Three times a day (TID) | ORAL | 0 refills | Status: AC | PRN

## 2020-04-24 MED ORDER — ONDANSETRON HCL 4 MG PO TABS: 4.0000 mg | ORAL_TABLET | Freq: Three times a day (TID) | ORAL | 0 refills | Status: AC | PRN

## 2020-04-24 MED ORDER — CEPHALEXIN 500 MG PO CAPS: 500.0000 mg | ORAL_CAPSULE | Freq: Three times a day (TID) | ORAL | 0 refills | Status: DC

## 2020-04-26 DIAGNOSIS — M205X1 Other deformities of toe(s) (acquired), right foot: Secondary | ICD-10-CM | POA: Diagnosis not present

## 2020-04-29 ENCOUNTER — Other Ambulatory Visit: Payer: Self-pay | Admitting: Podiatry

## 2020-04-29 MED ORDER — TRAMADOL HCL 50 MG PO TABS: 50.0000 mg | ORAL_TABLET | Freq: Three times a day (TID) | ORAL | 0 refills | Status: AC | PRN

## 2020-05-01 ENCOUNTER — Other Ambulatory Visit: Payer: Self-pay

## 2020-05-01 ENCOUNTER — Ambulatory Visit (INDEPENDENT_AMBULATORY_CARE_PROVIDER_SITE_OTHER): Payer: Medicare Other | Admitting: Podiatry

## 2020-05-01 ENCOUNTER — Encounter: Payer: Self-pay | Admitting: Podiatry

## 2020-05-01 DIAGNOSIS — M205X1 Other deformities of toe(s) (acquired), right foot: Secondary | ICD-10-CM

## 2020-05-01 DIAGNOSIS — Z9889 Other specified postprocedural states: Secondary | ICD-10-CM

## 2020-05-01 NOTE — Progress Notes (Signed)
She presents today she is status post amputation third toe right foot date of surgery 04/26/2020.  She denies fever chills nausea vomiting muscle aches and pains stating that it has not been too bad as she refers to the pain.  Objective: Vital signs are stable she is alert and oriented x3.  Pulses are palpable.  There is no erythema edema cellulitis drainage or odor sutures are intact margins were coapting.  Assessment: Well-healing surgical foot right x1 week.  Plan: Redressed today dressed a compressive dressing we will follow-up with her in 1 week to see if we could remove the sutures at that time.

## 2020-05-08 ENCOUNTER — Other Ambulatory Visit: Payer: Self-pay

## 2020-05-08 ENCOUNTER — Encounter: Payer: Self-pay | Admitting: Podiatry

## 2020-05-08 ENCOUNTER — Ambulatory Visit (INDEPENDENT_AMBULATORY_CARE_PROVIDER_SITE_OTHER): Payer: Medicare Other | Admitting: Podiatry

## 2020-05-08 DIAGNOSIS — Z9889 Other specified postprocedural states: Secondary | ICD-10-CM

## 2020-05-08 DIAGNOSIS — M205X1 Other deformities of toe(s) (acquired), right foot: Secondary | ICD-10-CM

## 2020-05-08 NOTE — Progress Notes (Signed)
She presents today date of surgery 04/26/2020 status post amputation third toe right foot.  States that is feeling pretty good have not had any pain with that my toes are a bit wacky but at least they do not hurt.  Objective: Vital signs are stable alert and oriented x3.  Pulses are palpable.  There is no erythema edema cellulitis drainage or odor sutures removed today margins remain well coapted.  Assessment: Well-healing surgical foot amputation third toe at the metatarsophalangeal joint level right foot.  Plan: Sutures removed today dressing was placed will let her get back in her regular shoe gear over the next 2 weeks.  I will follow-up with her in about 2 to 4 weeks.

## 2020-05-22 ENCOUNTER — Encounter: Payer: Self-pay | Admitting: Podiatry

## 2020-05-22 ENCOUNTER — Ambulatory Visit (INDEPENDENT_AMBULATORY_CARE_PROVIDER_SITE_OTHER): Payer: Medicare Other | Admitting: Podiatry

## 2020-05-22 ENCOUNTER — Other Ambulatory Visit: Payer: Self-pay

## 2020-05-22 DIAGNOSIS — Z9889 Other specified postprocedural states: Secondary | ICD-10-CM

## 2020-05-22 DIAGNOSIS — M205X1 Other deformities of toe(s) (acquired), right foot: Secondary | ICD-10-CM

## 2020-05-22 NOTE — Progress Notes (Signed)
She presents today for follow-up of amputation third digit right foot.  States that is still staying swollen a lot she states that she has been on her foot quite a bit and that she does some walking around the block.  She states that is still a bit tender with shoe gear.  Date of surgery 04/26/2020  Objective: Vital signs stable alert oriented x3 mild to moderate edema overlying the forefoot particular around the second metatarsophalangeal joint area.  Seems to be tolerating all of this very well at this point I do not feel that there is any fracture or any disposition of the other toes.  Assessment: Edema status post amputation third toe right foot date of surgery April 26, 2020.  Plan: Follow-up with me next scheduled postop.

## 2020-06-05 ENCOUNTER — Other Ambulatory Visit: Payer: Self-pay

## 2020-06-05 ENCOUNTER — Encounter: Payer: Self-pay | Admitting: Podiatry

## 2020-06-05 ENCOUNTER — Ambulatory Visit (INDEPENDENT_AMBULATORY_CARE_PROVIDER_SITE_OTHER): Payer: Medicare Other | Admitting: Podiatry

## 2020-06-05 DIAGNOSIS — Z9889 Other specified postprocedural states: Secondary | ICD-10-CM

## 2020-06-05 DIAGNOSIS — M205X1 Other deformities of toe(s) (acquired), right foot: Secondary | ICD-10-CM

## 2020-06-05 NOTE — Progress Notes (Signed)
She presents today for follow-up of amputation third digit of the right foot.  She states that is doing just great she says feels so much better she is very happy with the outcome.  Objective: Vital signs are stable she is alert oriented x3 there is no erythema edema cellulitis drainage or odor no signs of any type of infection.  She does have lateral deviation of the first and second toes which may encroach on the fourth toe but I think the fourth toe will be plantarflexed enough that she will have to worry about any irritation.  Assessment: Well-healing surgical foot.  Plan: Well her start wearing regular shoes follow-up with me on an as-needed basis.

## 2020-09-18 ENCOUNTER — Ambulatory Visit: Payer: Medicare Other | Admitting: Podiatry

## 2021-03-05 DIAGNOSIS — I350 Nonrheumatic aortic (valve) stenosis: Secondary | ICD-10-CM | POA: Insufficient documentation

## 2021-09-23 DIAGNOSIS — M1812 Unilateral primary osteoarthritis of first carpometacarpal joint, left hand: Secondary | ICD-10-CM | POA: Insufficient documentation

## 2021-10-02 ENCOUNTER — Emergency Department: Payer: Medicare Other

## 2021-10-02 ENCOUNTER — Other Ambulatory Visit: Payer: Self-pay

## 2021-10-02 ENCOUNTER — Emergency Department
Admission: EM | Admit: 2021-10-02 | Discharge: 2021-10-02 | Disposition: A | Discharge status: Home / Self Care | Payer: Medicare Other | Attending: Emergency Medicine | Admitting: Emergency Medicine

## 2021-10-02 DIAGNOSIS — Z7982 Long term (current) use of aspirin: Secondary | ICD-10-CM | POA: Diagnosis not present

## 2021-10-02 DIAGNOSIS — R42 Dizziness and giddiness: Secondary | ICD-10-CM | POA: Diagnosis present

## 2021-10-02 DIAGNOSIS — R944 Abnormal results of kidney function studies: Secondary | ICD-10-CM | POA: Diagnosis not present

## 2021-10-02 DIAGNOSIS — E860 Dehydration: Secondary | ICD-10-CM | POA: Insufficient documentation

## 2021-10-02 DIAGNOSIS — R0789 Other chest pain: Secondary | ICD-10-CM | POA: Insufficient documentation

## 2021-10-02 LAB — BASIC METABOLIC PANEL
Anion gap: 7 (ref 5–15)
BUN: 34 mg/dL — ABNORMAL HIGH (ref 8–23)
CO2: 27 mmol/L (ref 22–32)
Calcium: 8.9 mg/dL (ref 8.9–10.3)
Chloride: 108 mmol/L (ref 98–111)
Creatinine, Ser: 1.24 mg/dL — ABNORMAL HIGH (ref 0.44–1.00)
GFR, Estimated: 43 mL/min — ABNORMAL LOW (ref >60)
Glucose, Bld: 110 mg/dL — ABNORMAL HIGH (ref 70–99)
Potassium: 4.2 mmol/L (ref 3.5–5.1)
Sodium: 142 mmol/L (ref 135–145)

## 2021-10-02 LAB — URINALYSIS, ROUTINE W REFLEX MICROSCOPIC
Bilirubin Urine: NEGATIVE
Glucose, UA: NEGATIVE mg/dL
Hgb urine dipstick: NEGATIVE
Ketones, ur: NEGATIVE mg/dL
Leukocytes,Ua: NEGATIVE
Nitrite: NEGATIVE
Protein, ur: NEGATIVE mg/dL
Specific Gravity, Urine: 1.02 (ref 1.005–1.030)
pH: 6 (ref 5.0–8.0)

## 2021-10-02 LAB — CBC
HCT: 38.5 % (ref 36.0–46.0)
Hemoglobin: 12.4 g/dL (ref 12.0–15.0)
MCH: 31.2 pg (ref 26.0–34.0)
MCHC: 32.2 g/dL (ref 30.0–36.0)
MCV: 97 fL (ref 80.0–100.0)
Platelets: 200 10*3/uL (ref 150–400)
RBC: 3.97 MIL/uL (ref 3.87–5.11)
RDW: 15.1 % (ref 11.5–15.5)
WBC: 5.4 10*3/uL (ref 4.0–10.5)
nRBC: 0 % (ref 0.0–0.2)

## 2021-10-02 LAB — HEPATIC FUNCTION PANEL
ALT: 19 U/L (ref 0–44)
AST: 22 U/L (ref 15–41)
Albumin: 3.7 g/dL (ref 3.5–5.0)
Alkaline Phosphatase: 61 U/L (ref 38–126)
Bilirubin, Direct: <0.1 mg/dL (ref 0.0–0.2)
Total Bilirubin: 0.6 mg/dL (ref 0.3–1.2)
Total Protein: 6.5 g/dL (ref 6.5–8.1)

## 2021-10-02 LAB — TROPONIN I (HIGH SENSITIVITY)
Troponin I (High Sensitivity): 5 ng/L (ref <18)
Troponin I (High Sensitivity): 5 ng/L (ref <18)

## 2021-10-02 NOTE — ED Triage Notes (Signed)
Pt works here as a Psychologist, occupational and c/o having dizziness for the past couple of days with "fog brain", today had some left sided chest pain, pt is in NAD at present ?

## 2021-10-02 NOTE — ED Provider Notes (Signed)
? ?[facility] ?Provider Note ? ? ? Event Date/Time  ? First MD Initiated Contact with Patient 10/02/21 1507   ?  (approximate) ? ? ?History  ? ?Dizziness and Chest Pain ? ? ?HPI ? ?Karen Parsons is a 81 y.o. female  with lightheadness for a few days.  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 She went to endoscopy suite and went to have nurse friend check her BP.  Her diastolic pressure was "high" and was told to come here to get checked out. It started a couple of days ago with sitting to standing mostly.  No history of blood pressure issues. Only on aspirin at home.  She feels unsteady and feels like she needs to sit down.  She does report some chest pain on the left side under clavicle. Does report doing some gardening prior to chest pain starting.  Patient denies any falls, denies any in her head.  Denies any headaches.  She reports feeling really well at this time and is hoping to be discharged home soon so she can make it home before gets dark ? ?Review patient's prior blood pressure on 2/6 that was 160/92 ? ?  ? ? ?Physical Exam  ? ?Triage Vital Signs: ?ED Triage Vitals  ?Enc Vitals Group  ?   BP 10/02/21 1426 (!) 163/89  ?   Pulse Rate 10/02/21 1426 68  ?   Resp 10/02/21 1426 16  ?   Temp 10/02/21 1426 98.1 ?F (36.7 ?C)  ?   Temp Source 10/02/21 1426 Oral  ?   SpO2 10/02/21 1426 96 %  ?   Weight 10/02/21 1427 103 lb (46.7 kg)  ?   Height 10/02/21 1427 '5\' 4"'$  (1.626 m)  ?   Head Circumference --   ?   Peak Flow --   ?   Pain Score 10/02/21 1427 0  ?   Pain Loc --   ?   Pain Edu? --   ?   Excl. in [place]? --   ? ? ?Most recent vital signs: ?Vitals:  ? 10/02/21 1426 10/02/21 1530  ?BP: (!) 163/89 (!) 159/98  ?Pulse: 68 63  ?Resp: 16 15  ?Temp: 98.1 ?F (36.7 ?C)   ?SpO2: 96% 100%  ? ? ? ?General: Awake, no distress.  ?CV:  Good peripheral perfusion.  Possible murmur ?Resp:  Normal effort. Clear  ?Abd:  No distention. Non tender  ?Other:  No chest wall pain cranial nerves II through XII are intact.  Equal  strength in arms and legs.  Finger-nose intact.  Patient able to stand up without any dizziness.  She is able to ambulate without any ataxia. ?No swelling in the legs.  No calf tenderness ? ?ED Results / Procedures / Treatments  ? ?Labs ?(all labs ordered are listed, but only abnormal results are displayed) ?Labs Reviewed  ?BASIC METABOLIC PANEL - Abnormal; Notable for the following components:  ?    Result Value  ? Glucose, Bld 110 (*)   ? BUN 34 (*)   ? Creatinine, Ser 1.24 (*)   ? GFR, Estimated 43 (*)   ? All other components within normal limits  ?CBC  ?URINALYSIS, ROUTINE W REFLEX MICROSCOPIC  ?HEPATIC FUNCTION PANEL  ?CBG MONITORING, ED  ?TROPONIN I (HIGH SENSITIVITY)  ? ? ? ?EKG ? ?My interpretation of EKG: ? ?Normal sinus rate of 73, no st elevation, no twi, normal intervals.  ? ?RADIOLOGY ?I have reviewed the xray personally and agree with radiology read  no pneumonia or pneumothorax ? ? ?PROCEDURES: ? ?Critical Care performed: No ? ?.1-3 Lead EKG Interpretation ?Performed by: Vanessa Mesilla, MD ?Authorized by: Vanessa Delhi, MD  ? ?  Interpretation: normal   ?  ECG rate:  60 ?  ECG rate assessment: normal   ?  Rhythm: sinus rhythm   ?  Ectopy: none   ?  Conduction: normal   ? ? ?MEDICATIONS ORDERED IN ED: ?Medications - No data to display ? ? ?IMPRESSION / MDM / ASSESSMENT AND PLAN / ED COURSE  ?I reviewed the triage vital signs and the nursing notes. ? ? ?Patient comes in with some intermittent lightheadedness with standing up and moving with slightly elevated blood pressure.  She reports a little bit of chest pain but denies any chest pain at this time.  EKG, cardiac markers evaluate for ACS, chest x-ray to evaluate for any pneumonia, pneumothorax.  Doubt PE no swelling in the legs to suggest heart failure. ?The patient is on the cardiac monitor to evaluate for evidence of arrhythmia and/or significant heart rate changes.  Discussed with patient that I thought I could have heard a slight murmur.  She  reports that she has been told this previously but she is not sure how long ago.  I reviewed her records and she had an echocardiogram 5 years ago that was reassuring.  Told her to discuss with her primary care doctor about repeat echocardiogram.  She expressed understanding.  Neuro exam is normal and I do not feel that this represents a posterior stroke ? ?BMP shows slightly elevated creatinine.  Discussed with patient giving her some IV fluids but patient states that she would really like to get home before get start out declines.  We discussed increase p.o. intake of water, Gatorade, Pedialyte and having a recheck with her primary care doctor.  She expressed understanding felt comfortable this plan.  Cardiac markers were negative x2.  Orthostatics were negative.  UA without evidence of UTI.  CBC reassuring. ? ?I considered admission for patient but given her reassuring work-up and patient feels good with ambulation I think is safe for patient be discharged home with PCP follow-up. ? ? ? ? ? ?FINAL CLINICAL IMPRESSION(S) / ED DIAGNOSES  ? ?Final diagnoses:  ?Dehydration  ?Lightheadedness  ? ? ? ?Rx / DC Orders  ? ?ED Discharge Orders   ? ? None  ? ?  ? ? ? ?Note:  This document was prepared using Dragon voice recognition software and may include unintentional dictation errors. ?  ?Vanessa Weedpatch, MD ?10/02/21 1741 ? ?

## 2021-10-02 NOTE — Discharge Instructions (Addendum)
Please discuss with your primary care doctor about echocardiogram given I think I hear a slight murmur. ?Please have them recheck your blood pressure and make sure that is not going up higher ?Please have them recheck your kidney function or your creatinine given it was slightly elevated today and drink plenty of water, Gatorade without sugar or Pedialyte to help hydrate yourself. ? ?Return to the ER if develop worsening symptoms or any other concern ? ? ?

## 2021-11-26 ENCOUNTER — Encounter: Payer: Self-pay | Admitting: Podiatry

## 2021-11-26 ENCOUNTER — Ambulatory Visit (INDEPENDENT_AMBULATORY_CARE_PROVIDER_SITE_OTHER): Payer: Medicare Other

## 2021-11-26 ENCOUNTER — Ambulatory Visit (INDEPENDENT_AMBULATORY_CARE_PROVIDER_SITE_OTHER): Payer: Medicare Other | Admitting: Podiatry

## 2021-11-26 DIAGNOSIS — M205X1 Other deformities of toe(s) (acquired), right foot: Secondary | ICD-10-CM

## 2021-11-26 DIAGNOSIS — Z86010 Personal history of colonic polyps: Secondary | ICD-10-CM | POA: Insufficient documentation

## 2021-11-26 NOTE — Progress Notes (Signed)
She presents today after having a amputation about 2 years ago she is concerned that she has deviation now of the first and second toes on that right foot.  She states that she has developed a bunion that seems to be getting worse and her second toe seems to be getting longer.  She states that things are just starting to shift.  Objective: Vital signs are stable she is alert and oriented x3.  Pulses are palpable.  She does have a lateral deviation of the first metatarsophalangeal joint as well as the second toe at the metatarsal phalangeal joint.  The the second and fourth toe are communicating.  Radiographs taken today demonstrate dislocation of the first second digits at the metatarsal phalangeal joints the head of the third metatarsal phalangeal joint is still intact to help prevent lateral deviation.  But that has failed to be of benefit.  Assessment lateral deviation of the first and second toes after amputation of the third digit.  Plan: Placed her in a spacer today to see if this will help alleviate any of her symptoms.  She will follow-up with Dr. Sherryle Lis for a possible consult regarding correction of these condition on Monday morning.

## 2021-12-01 ENCOUNTER — Ambulatory Visit (INDEPENDENT_AMBULATORY_CARE_PROVIDER_SITE_OTHER): Payer: Medicare Other | Admitting: Podiatry

## 2021-12-01 DIAGNOSIS — M2061 Acquired deformities of toe(s), unspecified, right foot: Secondary | ICD-10-CM | POA: Diagnosis not present

## 2021-12-01 DIAGNOSIS — M2011 Hallux valgus (acquired), right foot: Secondary | ICD-10-CM | POA: Diagnosis not present

## 2021-12-01 DIAGNOSIS — M21611 Bunion of right foot: Secondary | ICD-10-CM | POA: Diagnosis not present

## 2021-12-05 NOTE — Progress Notes (Signed)
  Subjective:  Patient ID: Karen Parsons, female    DOB: 12-May-1941,  MRN: [id number]  Chief Complaint  Patient presents with   Hammer Toe    Right foot follow up    81 y.o. female presents with the above complaint. History confirmed with patient.  She is referred to me by Dr. Milinda Pointer for consideration of surgical intervention she previously underwent right third toe amputation and the second and large toe are deviating to the side impinging together she has been wearing a silicone pad and this is helped  Objective:  Physical Exam: warm, good capillary refill, no trophic changes or ulcerative lesions, normal DP and PT pulses, normal sensory exam, and she has prior right third toe amputation, lateral abduction deformity of the second and first digits with medial adduction deformity of the fourth and fifth digits Assessment:   1. Hallux valgus with bunions, right   2. Acquired toe deformity, right      Plan:  Patient was evaluated and treated and all questions answered.  I reviewed my clinical findings and her last radiographs with her.  We discussed that this can be a common result of digital amputation.  She has been using a silicone spacer and this is alleviated most of her pain.  I discussed that I do not think that there is any way to correct the major deformity without likely metatarsal head resection which would require a first metatarsal phalangeal joint arthrodesis as well due to her bunion deformity.  She will continue nonsurgical treatment and return to see me as needed if this does not work for her.  Return if symptoms worsen or fail to improve.

## 2022-04-08 ENCOUNTER — Other Ambulatory Visit: Payer: Self-pay | Admitting: Otolaryngology

## 2022-04-10 LAB — SURGICAL PATHOLOGY

## 2022-08-10 ENCOUNTER — Other Ambulatory Visit: Payer: Self-pay | Admitting: Orthopedic Surgery

## 2022-08-12 ENCOUNTER — Encounter: Payer: Self-pay | Admitting: Orthopedic Surgery

## 2022-08-12 ENCOUNTER — Other Ambulatory Visit: Payer: Self-pay

## 2022-08-18 ENCOUNTER — Other Ambulatory Visit: Payer: Self-pay

## 2022-08-18 ENCOUNTER — Encounter: Payer: Self-pay | Admitting: Orthopedic Surgery

## 2022-08-18 ENCOUNTER — Ambulatory Visit: Payer: Self-pay

## 2022-08-18 ENCOUNTER — Ambulatory Visit: Payer: Medicare Other | Admitting: Anesthesiology

## 2022-08-18 ENCOUNTER — Encounter
Admission: RE | Disposition: A | Discharge status: Home / Self Care | Payer: Self-pay | Source: Ambulatory Visit | Attending: Orthopedic Surgery

## 2022-08-18 ENCOUNTER — Ambulatory Visit
Admission: RE | Admit: 2022-08-18 | Discharge: 2022-08-18 | Disposition: A | Discharge status: Home / Self Care | Payer: Medicare Other | Source: Ambulatory Visit | Attending: Orthopedic Surgery | Admitting: Orthopedic Surgery

## 2022-08-18 DIAGNOSIS — G309 Alzheimer's disease, unspecified: Secondary | ICD-10-CM | POA: Insufficient documentation

## 2022-08-18 DIAGNOSIS — M1990 Unspecified osteoarthritis, unspecified site: Secondary | ICD-10-CM | POA: Insufficient documentation

## 2022-08-18 DIAGNOSIS — K219 Gastro-esophageal reflux disease without esophagitis: Secondary | ICD-10-CM | POA: Insufficient documentation

## 2022-08-18 DIAGNOSIS — I10 Essential (primary) hypertension: Secondary | ICD-10-CM | POA: Diagnosis not present

## 2022-08-18 DIAGNOSIS — E785 Hyperlipidemia, unspecified: Secondary | ICD-10-CM | POA: Insufficient documentation

## 2022-08-18 DIAGNOSIS — Z87891 Personal history of nicotine dependence: Secondary | ICD-10-CM | POA: Diagnosis not present

## 2022-08-18 DIAGNOSIS — F0280 Dementia in other diseases classified elsewhere without behavioral disturbance: Secondary | ICD-10-CM | POA: Diagnosis not present

## 2022-08-18 DIAGNOSIS — M1812 Unilateral primary osteoarthritis of first carpometacarpal joint, left hand: Secondary | ICD-10-CM | POA: Diagnosis present

## 2022-08-18 HISTORY — PX: CARPOMETACARPAL (CMC) FUSION OF THUMB: SHX6290

## 2022-08-18 SURGERY — CARPOMETACARPAL (CMC) FUSION OF THUMB
Anesthesia: General | Site: Thumb | Laterality: Left

## 2022-08-18 MED ORDER — LACTATED RINGERS IV SOLN: INTRAVENOUS | Status: DC

## 2022-08-18 MED ORDER — LIDOCAINE HCL (CARDIAC) PF 100 MG/5ML IV SOSY
PREFILLED_SYRINGE | INTRAVENOUS | Status: DC | PRN
  Administered 2022-08-18: 40 mg via INTRATRACHEAL

## 2022-08-18 MED ORDER — ACETAMINOPHEN 160 MG/5ML PO SOLN: 960.0000 mg | Freq: Once | ORAL | Status: AC

## 2022-08-18 MED ORDER — EPHEDRINE SULFATE (PRESSORS) 50 MG/ML IJ SOLN
INTRAMUSCULAR | Status: DC | PRN
  Administered 2022-08-18: 5 mg via INTRAVENOUS

## 2022-08-18 MED ORDER — 0.9 % SODIUM CHLORIDE (POUR BTL) OPTIME
TOPICAL | Status: DC | PRN
  Administered 2022-08-18: 500 mL

## 2022-08-18 MED ORDER — OXYCODONE HCL 5 MG PO TABS
5.0000 mg | ORAL_TABLET | Freq: Once | ORAL | Status: AC | PRN
  Administered 2022-08-18: 5 mg via ORAL

## 2022-08-18 MED ORDER — MIDAZOLAM HCL 5 MG/5ML IJ SOLN
INTRAMUSCULAR | Status: DC | PRN
  Administered 2022-08-18: 1 mg via INTRAVENOUS

## 2022-08-18 MED ORDER — PROPOFOL 10 MG/ML IV BOLUS
INTRAVENOUS | Status: DC | PRN
  Administered 2022-08-18: 80 mg via INTRAVENOUS
  Administered 2022-08-18: 20 mg via INTRAVENOUS

## 2022-08-18 MED ORDER — ONDANSETRON HCL 4 MG/2ML IJ SOLN
INTRAMUSCULAR | Status: DC | PRN
  Administered 2022-08-18: 4 mg via INTRAVENOUS

## 2022-08-18 MED ORDER — ONDANSETRON HCL 4 MG/2ML IJ SOLN: 4.0000 mg | Freq: Once | INTRAMUSCULAR | Status: DC | PRN

## 2022-08-18 MED ORDER — CEFAZOLIN SODIUM-DEXTROSE 2-4 GM/100ML-% IV SOLN
2.0000 g | INTRAVENOUS | Status: AC
  Administered 2022-08-18: 2 g via INTRAVENOUS

## 2022-08-18 MED ORDER — BUPIVACAINE HCL (PF) 0.5 % IJ SOLN
INTRAMUSCULAR | Status: DC | PRN
  Administered 2022-08-18: 10 mL

## 2022-08-18 MED ORDER — GELATIN ABSORBABLE 12-7 MM EX MISC
CUTANEOUS | Status: DC | PRN
  Administered 2022-08-18: 1 via TOPICAL

## 2022-08-18 MED ORDER — ACETAMINOPHEN 500 MG PO TABS
500.0000 mg | ORAL_TABLET | Freq: Once | ORAL | Status: AC
  Administered 2022-08-18: 500 mg via ORAL

## 2022-08-18 MED ORDER — FENTANYL CITRATE PF 50 MCG/ML IJ SOSY: 25.0000 ug | PREFILLED_SYRINGE | INTRAMUSCULAR | Status: DC | PRN

## 2022-08-18 MED ORDER — OXYCODONE HCL 5 MG/5ML PO SOLN: 5.0000 mg | Freq: Once | ORAL | Status: AC | PRN

## 2022-08-18 MED ORDER — FENTANYL CITRATE (PF) 100 MCG/2ML IJ SOLN
INTRAMUSCULAR | Status: DC | PRN
  Administered 2022-08-18 (×4): 25 ug via INTRAVENOUS

## 2022-08-18 MED ORDER — HYDROCODONE-ACETAMINOPHEN 5-325 MG PO TABS: 1.0000 | ORAL_TABLET | ORAL | 0 refills | Status: AC | PRN

## 2022-08-18 MED ORDER — GLYCOPYRROLATE 0.2 MG/ML IJ SOLN
INTRAMUSCULAR | Status: DC | PRN
  Administered 2022-08-18 (×2): .1 mg via INTRAVENOUS

## 2022-08-18 SURGICAL SUPPLY — 25 items
APL PRP STRL LF DISP 70% ISPRP (MISCELLANEOUS) ×1
BNDG CMPR STD VLCR NS LF 5.8X3 (GAUZE/BANDAGES/DRESSINGS) ×1
BNDG ELASTIC 3X5.8 VLCR NS LF (GAUZE/BANDAGES/DRESSINGS) ×1 IMPLANT
BNDG GZE 12X3 1 PLY HI ABS (GAUZE/BANDAGES/DRESSINGS) ×1
BNDG STRETCH GAUZE 3IN X12FT (GAUZE/BANDAGES/DRESSINGS) ×1 IMPLANT
CHLORAPREP W/TINT 26 (MISCELLANEOUS) ×1 IMPLANT
CUFF TOURN SGL QUICK 18X4 (TOURNIQUET CUFF) IMPLANT
DRAPE FLUOR MINI C-ARM 54X84 (DRAPES) ×1 IMPLANT
GAUZE SPONGE 4X4 12PLY STRL (GAUZE/BANDAGES/DRESSINGS) ×1 IMPLANT
GAUZE XEROFORM 1X8 LF (GAUZE/BANDAGES/DRESSINGS) ×1 IMPLANT
GLOVE SURG SYN 9.0  PF PI (GLOVE) ×1
GLOVE SURG SYN 9.0 PF PI (GLOVE) ×1 IMPLANT
GOWN STRL REUS W/ TWL LRG LVL3 (GOWN DISPOSABLE) ×1 IMPLANT
GOWN STRL REUS W/TWL LRG LVL3 (GOWN DISPOSABLE) ×1
KIT TURNOVER KIT A (KITS) ×1 IMPLANT
NS IRRIG 500ML POUR BTL (IV SOLUTION) ×1 IMPLANT
PACK EXTREMITY ARMC (MISCELLANEOUS) ×1 IMPLANT
SPLINT CAST 1 STEP 3X12 (MISCELLANEOUS) ×1 IMPLANT
SPLINT WRIST M LT TX990308 (SOFTGOODS) ×1 IMPLANT
SUT ETHILON 4-0 (SUTURE) ×1
SUT ETHILON 4-0 FS2 18XMFL BLK (SUTURE) ×1
SUT VIC AB 3-0 SH 27 (SUTURE) ×1
SUT VIC AB 3-0 SH 27X BRD (SUTURE) ×1 IMPLANT
SUTURE ETHLN 4-0 FS2 18XMF BLK (SUTURE) ×1 IMPLANT
SYSTEM IMPLANT TIGHTROPE MINI (Anchor) ×1 IMPLANT

## 2022-08-18 NOTE — Anesthesia Postprocedure Evaluation (Signed)
Anesthesia Post Note  Patient: Karen Parsons  Procedure(s) Performed: Left thumb carpometacarpal arthroplasty (Left: Thumb)  Patient location during evaluation: PACU Anesthesia Type: General Level of consciousness: awake and alert Pain management: pain level controlled Vital Signs Assessment: post-procedure vital signs reviewed and stable Respiratory status: spontaneous breathing, nonlabored ventilation, respiratory function stable and patient connected to nasal cannula oxygen Cardiovascular status: blood pressure returned to baseline and stable Postop Assessment: no apparent nausea or vomiting Anesthetic complications: no   No notable events documented.   Last Vitals:  Vitals:   08/18/22 1124 08/18/22 1330  BP: (!) 178/93 (!) 145/87  Pulse: 65 80  Resp: 20 14  Temp: 36.8 C (!) 36.3 C  SpO2: 98% 97%    Last Pain:  Vitals:   08/18/22 1330  TempSrc:   PainSc: Asleep                 Arita Miss

## 2022-08-18 NOTE — Anesthesia Procedure Notes (Signed)
Procedure Name: LMA Insertion Date/Time: 08/18/2022 12:25 PM  Performed by: Moises Blood, CRNAPre-anesthesia Checklist: Patient identified, Emergency Drugs available, Suction available, Patient being monitored and Timeout performed Patient Re-evaluated:Patient Re-evaluated prior to induction Oxygen Delivery Method: Circle system utilized Preoxygenation: Pre-oxygenation with 100% oxygen Induction Type: IV induction LMA: LMA inserted LMA Size: 4.0 Number of attempts: 1 Dental Injury: Teeth and Oropharynx as per pre-operative assessment

## 2022-08-18 NOTE — H&P (Signed)
Chief Complaint Patient presents with Left Thumb - Follow-up   History of the Present Illness: Karen Parsons is a 82 y.o. female here today.  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 She had an injection last year, 2023 with Rosalia Hammers, DO. She has been given a brace, not wearing it currently. Prior x-rays from 06/2022 show severe thumb CMC arthritis with subluxation, subchondral cyst, and joint destruction.  The patient is accompanied by an adult female. She reports constant pain in her left thumb. She had received injections last year 2023, which helped for a few days, the first 1 to 2 injections worked very well, but the last injection did not help. She is scheduled for an injection on 08/20/2022.  She is right-hand dominant.  She does not take any medications. She uses Atrovent inhaler as needed.  She has had bilateral total knee arthroplasties.  She volunteers at the hospital.  I have reviewed past medical, surgical, social and family history, and allergies as documented in the EMR.  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 Prior MRI with lumbar degenerative disc disease and moderate spinal stenosis with epidural steroid injection providing good relief. 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 Echo with normal LVEF without sequelae valvular heart disease. CT angiogram of the neck and brain with minimal atherosclerotic disease. High-grade stenosis of innominate vein, with collateral filling  Past Surgical History: Past Surgical History: Procedure Laterality Date [place] still has ovaries Cardiac Catheterization 2011 COLONOSCOPY 05/20/2010 Adenomatous Polyp: CBF 04/2015 HEMORRHOIDECTOMY BY SIMPLE LIGATION 10/02/2013 Right knee arthroscopy, partial lateral meniscectomy and chondroplasty of the lateral femoral condyle Right 12/27/13 COLONOSCOPY 01/07/2015 Adenomatous Polyps: CBF 12/2019 EGD 01/07/2015 Barrett's Esophagus w/low grade dysplasia: CBF 04/2015 (4-6 months) EGD 05/23/2015 Barrett's Esophagus w/low-grade dysplasia: Needs Ablation Procedure per RTE; Sch'ed 07/11/2015 w/PYO EGD with BARRX 07/11/2015 Barrett's treated with BARRX/GERD/Repeat 2 months with Barrx/PYO EGD with BARRX 09/12/2015 Barrett's treated with BARRX/GERD/Repeat 2 months with Barrx/PYO EGD with [employer] 11/14/2015 Barrett's treated with BARRX/GERD/Repeat EGD in 6 months with Biopsies/RTE COLPOPEXY FOR SUSPENSION UTEROSACRUM INTRAPERITONEAL N/A 05/11/2016 Procedure: COLPOPEXY, VAGINAL; INTRA-PERITONEAL APPROACH (UTEROSACRAL, LEVATOR MYORRHAPHY); Surgeon: Larwance Rote, MD; Location: [facility] OR; Service: Gynecology; Laterality: N/A; CYSTOURETHROSCOPY N/A 05/11/2016 Procedure: CYSTOURETHROSCOPY; Surgeon: Larwance Rote, MD; Location: [facility] OR; Service: Gynecology; Laterality: N/A; COLPORRHAPHY FOR REPAIR CYSTOCELE ANTERIOR N/A 05/11/2016 Procedure: ANTERIOR COLPORRHAPHY, REPAIR OF CYSTOCELE WITH OR WITHOUT REPAIR OF URETHROCELE; Surgeon: Larwance Rote, MD; Location: [facility] OR; Service: Gynecology; Laterality: N/A; Right total knee arthroplasty  using computer-assisted navigation 05/10/2017 Dr Marry Guan EXTRACTION CATARACT EXTRACAPSULAR W/INSERTION INTRAOCULAR PROSTHESIS Right 07/26/2017 Procedure: R - LenSx /Toric/ ORA-------Cataract extraction with intraocular lens implant; Surgeon: Arline Asp, MD; Location: [place]; Service: Ophthalmology; Laterality: Right; Left total knee arthroplasty using computer-assisted navigation 09/25/2019 Dr Marry Guan CATARACT EXTRACTION EGD 12/09/2012, 04/05/2012, 01/31/2009 Barrett's Esophagus: CBF 11/2014; Recall Ltr mailed 09/26/2014 (dw); OV made 11/23/2014 @ 11am w/Kim Jerelene Redden NP (  dw) EGD 09/15/2016 [facility]) Treated w/Argon Plasma Coagulation: CBF 10/2016 w/[facility] KNEE ARTHROSCOPY 2010 /2015 Both knees LENS EYE SURGERY Left pciol Meniscus repair Right foot/toe reconstructive surgery  Past Family History: Family History Problem Relation Age of Onset High blood pressure (Hypertension) Mother Myocardial Infarction (Heart attack) Father 55 COD Lung cancer Sister COD Heart disease Maternal Grandmother Prostate cancer Maternal Grandfather No Known Problems Paternal Grandmother No Known Problems Paternal Grandfather Heart disease Brother No Known Problems Son No Known Problems Son Cancer Sister died from lung cancer Anesthesia problems Neg Hx Malignant hyperthermia Neg Hx Glaucoma Neg Hx Macular degeneration Neg Hx Cataracts Neg Hx  Medications: Current Outpatient Medications Ordered in Epic Medication Sig Dispense Refill acetaminophen (TYLENOL) 650 MG ER tablet Take 1,300 mg by mouth As needed aspirin 81 MG EC tablet Take 81 mg by mouth once daily cholecalciferol (VITAMIN D3) 1000 unit tablet Take 1,000 Units by mouth once daily cyanocobalamin (VITAMIN B12) 1000 MCG tablet Take 1,000 mcg by mouth once daily omeprazole (PRILOSEC) 20 MG DR capsule TAKE 1 CAPSULE TWICE A DAY 180 capsule 3 rosuvastatin (CRESTOR) 5 MG tablet TAKE 1 TABLET ONCE A WEEK 12 tablet 3 triamcinolone 0.1 % cream Apply  topically 2 (two) times daily as needed (leg rash) 28.4 g 1  No current Epic-ordered facility-administered medications on file.  Allergies: Allergies Allergen Reactions Bactrim [Sulfamethoxazole-Trimethoprim] Nausea "Shaking all over" Lipitor [Atorvastatin] Muscle Pain Welchol [Colesevelam] Muscle Pain Zetia [Ezetimibe] Muscle Pain   Body mass index is 19.68 kg/m.  Review of Systems: A comprehensive 14 point ROS was performed, reviewed, and the pertinent orthopaedic findings are documented in the HPI.  Vitals: 08/06/22 0926 BP: (!) 146/78   General Physical Examination:  General/Constitutional: No apparent distress: well-nourished and well developed. Eyes: Pupils equal, round with synchronous movement. Lungs: Clear to auscultation HEENT: Normal Vascular: No edema, swelling or tenderness, except as noted in detailed exam. Cardiac: Heart rate and rhythm is regular. Integumentary: No impressive skin lesions present, except as noted in detailed exam. Neuro/Psych: Normal mood and affect, oriented to person, place, and time.  On exam, tenderness to the left thumb. Good left thumb flexion. There is lack of grip in her left thumb.  Radiographs:  No new imaging studies were obtained today.  Assessment: ICD-10-CM 1. 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  We discussed the patient's prior x-ray findings. I explained she has severe left thumb CMC arthritis. I recommend left thumb trapeziectomy. I explained the surgery and postoperative course in detail.  We will schedule the patient for left thumb trapeziectomy in the near future  Surgical Risks:  The nature of the condition and the proposed procedure has been reviewed in detail with the patient. Surgical versus non-surgical options and prognosis for recovery have  been reviewed and the inherent risks and benefits of each have been discussed including the risks of infection, bleeding, injury to nerves/blood vessels/tendons, incomplete relief of symptoms, persisting pain and/or stiffness, loss of function, complex regional pain syndrome, failure of the procedure, as appropriate.   Document Attestation: I, Janalyn Rouse, have reviewed and updated documentation for [facility], MD, utilizing [employer] DAX.   Electronically signed by Lauris Poag, MD at 08/08/2022 10:09 PM EST   Reviewed  H+P. No changes noted.

## 2022-08-18 NOTE — Transfer of Care (Signed)
Immediate Anesthesia Transfer of Care Note  Patient: Karen Parsons  Procedure(s) Performed: Left thumb carpometacarpal arthroplasty (Left: Thumb)  Patient Location: PACU  Anesthesia Type: General  Level of Consciousness: awake, alert  and patient cooperative  Airway and Oxygen Therapy: Patient Spontanous Breathing and Patient connected to supplemental oxygen  Post-op Assessment: Post-op Vital signs reviewed, Patient's Cardiovascular Status Stable, Respiratory Function Stable, Patent Airway and No signs of Nausea or vomiting  Post-op Vital Signs: Reviewed and stable  Complications: No notable events documented.

## 2022-08-18 NOTE — Discharge Instructions (Signed)
Ice to the back of the wrist and thumb today and tomorrow may help with pain Keep arm elevated Okay to move fingers but try not to move thumb Pain medicine as prescribed Call office if you are having any problems  [phone] Leave bandage in place clean and dry until return visit

## 2022-08-18 NOTE — Anesthesia Preprocedure Evaluation (Signed)
Anesthesia Evaluation  Patient identified by MRN, date of birth, ID band Patient awake    Reviewed: Allergy & Precautions, NPO status , Patient's Chart, lab work & pertinent test results  History of Anesthesia Complications Negative for: history of anesthetic complications  Airway Mallampati: II  TM Distance: >3 FB Neck ROM: Full    Dental no notable dental hx.    Pulmonary neg sleep apnea, neg COPD, former smoker   breath sounds clear to auscultation- rhonchi (-) wheezing      Cardiovascular Exercise Tolerance: Good (-) hypertension(-) CAD, (-) Past MI and (-) Cardiac Stents  Rhythm:Regular Rate:Normal - Systolic murmurs and - Diastolic murmurs    Neuro/Psych CVA, No Residual Symptoms  negative psych ROS   GI/Hepatic Neg liver ROS, hiatal hernia,GERD  Controlled,,  Endo/Other  negative endocrine ROSneg diabetes    Renal/GU negative Renal ROS     Musculoskeletal  (+) Arthritis ,    Abdominal  (+) - obese  Peds  Hematology negative hematology ROS (+)   Anesthesia Other Findings Past Medical History: No date: Barrett esophagus     Comment:  2018.  dr. Adria Devon said this has resolved after a recent              endoscopy July 2010: Barrett's esophagus     Comment:  [place],  No date: Benign neoplasm of colon No date: Bilateral renal cysts No date: Colon polyps No date: Cramp of limb No date: Diverticulosis No date: Generalized osteoarthritis No date: Genital prolapse No date: GERD (gastroesophageal reflux disease) No date: H/O seasonal allergies No date: Hallux valgus No date: Hemorrhoids No date: History of hiatal hernia No date: Hyperlipidemia     Comment:  intolerant of statins, normal diagnostic cath July 2010               Nehemiah Massed) No date: Hyperlipidemia No date: Left ovarian cyst No date: Osteoporosis     Comment:  by DEXA 2008 2011: S/P colonoscopy     Comment:  [name], polyp removed 11/2015:  Stroke ([place])     Comment:  TIA. no deficits   Reproductive/Obstetrics                              Lab Results  Component Value Date   WBC 5.4 10/02/2021   HGB 12.4 10/02/2021   HCT 38.5 10/02/2021   MCV 97.0 10/02/2021   PLT 200 10/02/2021    Anesthesia Physical Anesthesia Plan  ASA: 2  Anesthesia Plan: General   Post-op Pain Management: Fentanyl IV   Induction: Intravenous  PONV Risk Score and Plan: 2 and Propofol infusion, TIVA and Ondansetron  Airway Management Planned: Nasal Cannula  Additional Equipment: None  Intra-op Plan:   Post-operative Plan:   Informed Consent: I have reviewed the patients History and Physical, chart, labs and discussed the procedure including the risks, benefits and alternatives for the proposed anesthesia with the patient or authorized representative who has indicated his/her understanding and acceptance.     Dental advisory given  Plan Discussed with: CRNA and Surgeon  Anesthesia Plan Comments: (Discussed risks of anesthesia with patient, including possibility of difficulty with spontaneous ventilation under anesthesia necessitating airway intervention, PONV, and rare risks such as cardiac or respiratory or neurological events, and allergic reactions. Discussed the role of CRNA in patient's perioperative care. Patient understands.)         Anesthesia Quick Evaluation

## 2022-08-18 NOTE — Op Note (Addendum)
08/18/2022  1:34 PM  PATIENT:  Karen Parsons  82 y.o. 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  PROCEDURE:  Procedure(s): Left thumb carpometacarpal arthroplasty (Left)  SURGEON: Laurene Footman, MD  ASSISTANTS: None  ANESTHESIA:   general  EBL:  Total I/O In: 700 [I.V.:700] Out: 5 [Blood:5]  BLOOD ADMINISTERED:none  DRAINS: none   LOCAL MEDICATIONS USED:  MARCAINE     SPECIMEN:  No Specimen  DISPOSITION OF SPECIMEN:  N/A  COUNTS:  YES  TOURNIQUET:   Total Tourniquet Time Documented: Forearm (Left) - 46 minutes Total: Forearm (Left) - 46 minutes   IMPLANTS: Mini tight rope x 1  DICTATION: .Dragon Dictation patient was brought to the operating room and after adequate anesthesia was obtained tourniquet is applied to the left upper forearm.  After patient identification and timeout procedure was completed after having prepped and draped the arm tourniquet was raised to 250.  Incision was made along the radial side of the first metacarpal towards the radial styloid.  Subcutaneous nerves were preserved and the capsule was opened at the thumb [facility] joint with extensive fluid and degenerative changes and degenerative capsule present.  Capsule was elevated and the trapezium was removed with severe degenerative changes noted with collapse and subluxation of the joint.  After removal of most of the trapezium with finger traction the thumb could be brought back out to a more normal position.  A wire was then sent between the first and second metacarpals to provide [place] for the implant wire was then pulled through with the mini tight rope attached mini tight rope placed against the base of the first metacarpal and then the anchor on the second metacarpal side was passed through the sutures and knots placed with traction applied to the thumb.  On  pistoning under fluoroscopy there was no abnormal motion of the thumb CMC and it appeared stable.  Sutures were cut short on the implant and they were buried under the skin is much as possible with total of 10 cc half percent Sensorcaine infiltrated in the area of the incisions for postop analgesia.  Gelfoam place in defect to create scar tissue in former trapezium space.Capsule was closed using 3-0 Vicryl followed by 4-0 nylon for all skin wounds Xeroform 4 x 4 web roll and a thumb spica splint were applied followed by Ace wrap with tourniquet let down prior to application of splint.  PLAN OF CARE: Discharge to home after PACU  PATIENT DISPOSITION:  PACU - hemodynamically stable.

## 2022-08-19 ENCOUNTER — Encounter: Payer: Self-pay | Admitting: Orthopedic Surgery

## 2022-08-26 ENCOUNTER — Emergency Department
Admission: EM | Admit: 2022-08-26 | Discharge: 2022-08-26 | Disposition: A | Discharge status: Home / Self Care | Payer: Medicare Other | Attending: Emergency Medicine | Admitting: Emergency Medicine

## 2022-08-26 ENCOUNTER — Other Ambulatory Visit: Payer: Self-pay

## 2022-08-26 DIAGNOSIS — F0390 Unspecified dementia without behavioral disturbance: Secondary | ICD-10-CM | POA: Insufficient documentation

## 2022-08-26 DIAGNOSIS — K5900 Constipation, unspecified: Secondary | ICD-10-CM

## 2022-08-26 LAB — BASIC METABOLIC PANEL
Anion gap: 8 (ref 5–15)
BUN: 34 mg/dL — ABNORMAL HIGH (ref 8–23)
CO2: 29 mmol/L (ref 22–32)
Calcium: 8.9 mg/dL (ref 8.9–10.3)
Chloride: 102 mmol/L (ref 98–111)
Creatinine, Ser: 0.86 mg/dL (ref 0.44–1.00)
GFR, Estimated: >60 mL/min (ref >60)
Glucose, Bld: 109 mg/dL — ABNORMAL HIGH (ref 70–99)
Potassium: 4.1 mmol/L (ref 3.5–5.1)
Sodium: 139 mmol/L (ref 135–145)

## 2022-08-26 LAB — CBC WITH DIFFERENTIAL/PLATELET
Abs Immature Granulocytes: 0.03 10*3/uL (ref 0.00–0.07)
Basophils Absolute: 0 10*3/uL (ref 0.0–0.1)
Basophils Relative: 1 %
Eosinophils Absolute: 0.1 10*3/uL (ref 0.0–0.5)
Eosinophils Relative: 1 %
HCT: 39 % (ref 36.0–46.0)
Hemoglobin: 12.6 g/dL (ref 12.0–15.0)
Immature Granulocytes: 1 %
Lymphocytes Relative: 15 %
Lymphs Abs: 0.9 10*3/uL (ref 0.7–4.0)
MCH: 31.2 pg (ref 26.0–34.0)
MCHC: 32.3 g/dL (ref 30.0–36.0)
MCV: 96.5 fL (ref 80.0–100.0)
Monocytes Absolute: 0.6 10*3/uL (ref 0.1–1.0)
Monocytes Relative: 9 %
Neutro Abs: 4.4 10*3/uL (ref 1.7–7.7)
Neutrophils Relative %: 73 %
Platelets: 233 10*3/uL (ref 150–400)
RBC: 4.04 MIL/uL (ref 3.87–5.11)
RDW: 13.6 % (ref 11.5–15.5)
WBC: 6 10*3/uL (ref 4.0–10.5)
nRBC: 0 % (ref 0.0–0.2)

## 2022-08-26 LAB — CK: Total CK: 157 U/L (ref 38–234)

## 2022-08-26 MED ORDER — POLYETHYLENE GLYCOL 3350 17 G PO PACK: 17.0000 g | PACK | Freq: Every day | ORAL | 0 refills | Status: AC

## 2022-08-26 MED ORDER — MAGNESIUM CITRATE PO SOLN: 0.5000 | Freq: Four times a day (QID) | ORAL | 0 refills | Status: AC | PRN

## 2022-08-26 MED ORDER — MIDAZOLAM HCL 2 MG/2ML IJ SOLN
2.0000 mg | Freq: Once | INTRAMUSCULAR | Status: AC
  Administered 2022-08-26: 2 mg via INTRAVENOUS
  Filled 2022-08-26: qty 2

## 2022-08-26 NOTE — Discharge Instructions (Addendum)
Take the magnesium citrate today, and the MiraLAX daily as needed for constipation.  Return to the ER for new, worsening, or persistent severe constipation, abdominal or rectal pain, vomiting, blood in the stool, weakness, or any other new or worsening symptoms that concern you.

## 2022-08-26 NOTE — ED Provider Notes (Signed)
[facility] Provider Note    Event Date/Time   First MD Initiated Contact with Patient 08/26/22 [phone]     (approximate)   History   Constipation   HPI  Karen Parsons is a 82 y.o. female with a history of CVA, hyperlipidemia, osteoarthritis and GERD who presents with constipation for the last 3 to 4 days after the patient was put on oxycodone for pain for a hand surgery.  The patient states that she has had problems with constipation for a while but it got worse in the last week.  She denies any abdominal pain or any nausea or vomiting.  She states that last night she started having rectal pain and felt like there was stool that needed to come out.  She tried to disimpacted herself but was unable to.  I reviewed the past medical records.  The patient had a left thumb carpometacarpal arthroplasty on 2/27 by Dr. Rudene Christians which was uncomplicated.   Physical Exam   Triage Vital Signs: ED Triage Vitals  Enc Vitals Group     BP 08/26/22 0656 (!) 159/88     Pulse Rate 08/26/22 0656 70     Resp 08/26/22 0656 18     Temp 08/26/22 0656 98.3 F (36.8 C)     Temp Source 08/26/22 0656 Oral     SpO2 08/26/22 0656 100 %     Weight 08/26/22 0657 109 lb (49.4 kg)     Height 08/26/22 0657 '5\' 2"'$  (1.575 m)     Head Circumference --      Peak Flow --      Pain Score 08/26/22 0657 0     Pain Loc --      Pain Edu? --      Excl. in [place]? --     Most recent vital signs: Vitals:   08/26/22 0855 08/26/22 0905  BP:    Pulse: 61 (!) 59  Resp:    Temp:    SpO2: 100% 99%     General: Awake, no distress.  CV:  Good peripheral perfusion.  Resp:  Normal effort.  Abd:  No distention.  Other:  Palpable hard stool in the rectum on DRE.  External hemorrhoids.  No bleeding.   ED Results / Procedures / Treatments   Labs (all labs ordered are listed, but only abnormal results are displayed) Labs Reviewed  BASIC METABOLIC PANEL - Abnormal; Notable for the following  components:      Result Value   Glucose, Bld 109 (*)    BUN 34 (*)    All other components within normal limits  CBC WITH DIFFERENTIAL/PLATELET  CK     EKG    RADIOLOGY    PROCEDURES:  Critical Care performed: No  Fecal disimpaction  Date/Time: 08/26/2022 9:39 AM  Performed by: Arta Silence, MD Authorized by: Arta Silence, MD  Consent: Verbal consent obtained. Risks and benefits: risks, benefits and alternatives were discussed Consent given by: patient Patient identity confirmed: verbally with patient Local anesthesia used: no  Anesthesia: Local anesthesia used: no  Sedation: Patient sedated: no  Patient tolerance: patient tolerated the procedure well with no immediate complications Comments: Anxiolysis with '2mg'$  midazolam.        MEDICATIONS ORDERED IN ED: Medications  midazolam (VERSED) injection 2 mg (2 mg Intravenous Given 08/26/22 0803)     IMPRESSION / MDM / [place] / ED COURSE  I reviewed the triage vital signs and the nursing notes.  82 year old female  with PMH as noted above presents with constipation for the last 3 to 4 days after being started on oxycodone for pain for hand surgery.  On exam the patient is well-appearing and the abdomen is soft and nontender.  Differential diagnosis includes, but is not limited to, simple constipation, drug-induced constipation.  There is no evidence of bowel obstruction or other acute complication.  We will perform a DRE and do disimpaction if needed.  Patient's presentation is most consistent with acute, uncomplicated illness.  ----------------------------------------- 9:40 AM on 08/26/2022 -----------------------------------------  DRE revealed a large quantity of relatively hard stool in the rectum.  I gave 2 mg of Versed for anxiolysis and then performed a manual disimpaction.  The patient tolerated the procedure well with no immediate complications.  There was no bleeding.  The  stool was normal in color.  The patient's neighbor who helps take care of her states that she is at her baseline.  He reports a history of dementia.  We obtained basic labs as well as a CK as the neighbor stated that the patient was in so much pain at 1 point last night that she was lying on the floor for a while.  CBC shows normal hemoglobin, no leukocytosis, electrolytes are normal, and CK is normal.  At this time, the patient is stable for discharge.  I will prescribe MiraLAX as well as magnesium citrate.  I counseled the patient and the neighbor on the use of these medications and the plan of care.  I gave strict return precautions and they expressed understanding.   FINAL CLINICAL IMPRESSION(S) / ED DIAGNOSES   Final diagnoses:  Constipation, unspecified constipation type     Rx / DC Orders   ED Discharge Orders          Ordered    polyethylene glycol (MIRALAX) 17 g packet  Daily        08/26/22 0939    magnesium citrate SOLN  Every 6 hours PRN        08/26/22 [id number]             Note:  This document was prepared using Dragon voice recognition software and may include unintentional dictation errors.    Arta Silence, MD 08/26/22 [phone]

## 2022-08-26 NOTE — ED Triage Notes (Signed)
Reports had surgery on L hand 1 wk ago and has been on oxycodone since. Pt c/o constipation x 5 days. Pt has not tried stool softener or miralax. Pt did attempt manual disimpaction without success. Pt denies pain, n/v/d. Pt confused but at baseline per friend to ems on scene. Pt breathing unlabored speaking in full sentences.

## 2022-09-08 ENCOUNTER — Ambulatory Visit: Payer: Medicare Other | Attending: Orthopedic Surgery | Admitting: Occupational Therapy

## 2022-09-08 ENCOUNTER — Encounter: Payer: Self-pay | Admitting: Occupational Therapy

## 2022-09-08 DIAGNOSIS — M79642 Pain in left hand: Secondary | ICD-10-CM

## 2022-09-08 DIAGNOSIS — M25642 Stiffness of left hand, not elsewhere classified: Secondary | ICD-10-CM | POA: Insufficient documentation

## 2022-09-08 DIAGNOSIS — M6281 Muscle weakness (generalized): Secondary | ICD-10-CM | POA: Diagnosis present

## 2022-09-08 NOTE — Addendum Note (Signed)
Addended by: Rosalyn Gess on: 09/08/2022 01:15 PM   Modules accepted: Orders

## 2022-09-08 NOTE — Therapy (Signed)
[facility] [address], [place], [address] Phone: [phone]   Fax:  [phone]  Occupational Therapy Evaluation  Patient Details  Name: Karen Parsons MRN: [id number] Date of Birth: 07-Jan-1941 Referring Provider (OT): [place]   Encounter Date: 09/08/2022   OT End of Session - 09/08/22 1135     Visit Number 1    Number of Visits 12    Date for OT Re-Evaluation 11/03/22    OT Start Time 0950    OT Stop Time 1045    OT Time Calculation (min) 55 min    Activity Tolerance Patient tolerated treatment well    Behavior During Therapy [facility] for tasks assessed/performed             Past Medical History:  Diagnosis Date   Barrett esophagus    2018.  dr. Adria Devon said this has resolved after a recent endoscopy   Barrett's esophagus 12/2008   [place],    Benign neoplasm of colon    Bilateral renal cysts    Colon polyps    Cramp of limb    Diverticulosis    Generalized osteoarthritis    Genital prolapse    GERD (gastroesophageal reflux disease)    H/O seasonal allergies    Hallux valgus    Hemorrhoids    History of hiatal hernia    Hyperlipidemia    intolerant of statins, normal diagnostic cath July 2010 Nehemiah Massed)   Left ovarian cyst    Osteoporosis    by DEXA 2008   S/P colonoscopy 2011   [name], polyp removed   Stroke ([place]) 11/2015   TIA. no deficits    Past Surgical History:  Procedure Laterality Date   ABDOMINAL HYSTERECTOMY     age 23, menorrhagia   APPENDECTOMY     arthroscopy of knee Right    CARDIAC CATHETERIZATION Left 12/2008   [name]:  normal   CARPOMETACARPAL ([place]) FUSION OF THUMB Left 08/18/2022   Procedure: Left thumb carpometacarpal arthroplasty;  Surgeon: Hessie Knows, MD;  Location: [facility];  Service: Orthopedics;  Laterality: Left;   CATARACT EXTRACTION Left 2010   [name]   COLONOSCOPY WITH PROPOFOL N/A 01/07/2015   Procedure: COLONOSCOPY WITH PROPOFOL;   Surgeon: Manya Silvas, MD;  Location: [facility] ENDOSCOPY;  Service: Endoscopy;  Laterality: N/A;   ESOPHAGOGASTRODUODENOSCOPY N/A 01/07/2015   Procedure: ESOPHAGOGASTRODUODENOSCOPY (EGD);  Surgeon: Manya Silvas, MD;  Location: [facility] ENDOSCOPY;  Service: Endoscopy;  Laterality: N/A;   ESOPHAGOGASTRODUODENOSCOPY (EGD) WITH PROPOFOL N/A 05/23/2015   Procedure: ESOPHAGOGASTRODUODENOSCOPY (EGD) WITH PROPOFOL;  Surgeon: Manya Silvas, MD;  Location: [facility] ENDOSCOPY;  Service: Endoscopy;  Laterality: N/A;   ESOPHAGOGASTRODUODENOSCOPY (EGD) WITH PROPOFOL N/A 07/11/2015   Procedure: ESOPHAGOGASTRODUODENOSCOPY (EGD) WITH PROPOFOL with Barrx;  Surgeon: [name], MD;  Location[facility] END[facility]COPY;  Service: Gastroenterology;  Laterality: N/A;   ESOPHAGOGASTRODUODENOSCOPY (EGD) WITH PROPOFOL N/A 09/12/2015   Procedure: ESOPHAGOGASTRODUODENOSCOPY (EGD) WITH PROPOFOL;  Surgeon: [name], MD;  Location: [facility] ENDOSCOPY;  Service: Gastroenterology;  Laterality: N/A;   ESOPHAGOGASTRODUODENOSCOPY (EGD) WITH PROPOFOL N/A 11/14/2015   Procedure: ESOPHAGOGASTRODUODENOSCOPY (EGD) WITH PROPOFOL;  Surgeon: [name], MD;  Location: [facility] ENDOSCOPY;  Service: Gastroenterology;  Laterality: N/A;   EYE SURGERY     HEMORRHOID SURGERY  10/02/2013   by simple ligation   JOINT REPLACEMENT Bilateral    knees   KNEE ARTHROPLASTY Right 05/10/2017   Procedure: COMPUTER ASSISTED TOTAL KNEE ARTHROPLASTY;  Surgeon: Dereck Leep, MD;  Location: [facility] ORS;  Service: Orthopedics;  Laterality: Right;   KNEE ARTHROPLASTY Left 09/25/2019   Procedure: COMPUTER ASSISTED TOTAL KNEE ARTHROPLASTY;  Surgeon: Dereck Leep, MD;  Location: [facility] ORS;  Service: Orthopedics;  Laterality: Left;   medial meniscotomy Left 2007   [name]   OSTEOTOMY TOES Right 2008   [name]   TONSILLECTOMY      There were no vitals filed for this visit.   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 female who presents today status post left thumb CMC arthroplasty by Dr. Hessie Knows on 08/18/2022. Patient is doing well. Her pain is well-controlled. No numbness or tingling. No warmth redness or drainage. Swelling is significantly improved from previous visit.Left upper extremity shows CMC incision sites are intact with sutures. Sutures moved, Steri-Strips applied. She has little to no swelling throughout the hand or digits. She is moving the thumb and digits well with no signs of pain or discomfort. No warmth redness or drainage along the incision sites.  Impression: Arthritis of carpometacarpal (CMC) joint of left thumb (M18.12) Status post [facility] arthroplasty left (primary encounter diagnosis)  Plan: 1. Continue with thumb spica brace during the day and nighttime for 1 more week. In 1 week she can transition to wearing the brace only at night for an additional 3 weeks. 2. Start hand therapy 3. Okay to begin showering allow Steri-Strips to come off on their own 4. Follow-up with Dr. 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 Yes    Pain Score 4     Pain Location Hand    Pain Orientation Left    Pain Descriptors / Indicators Aching;Tightness    Pain Type Surgical pain    Pain Onset 1 to 4 weeks ago    Pain Frequency Intermittent               [facility] OT Assessment - 09/08/22 0001       Assessment   Medical Diagnosis L CMC Arthroplasty    Referring Provider (OT) Arvella Nigh PA    Onset Date/Surgical Date 08/18/22    Hand Dominance Right    Next MD Visit Pt to schedule 6 wks s/p      Precautions   Required Braces or Orthoses --   on with heavy activities -and night  time     Home  Environment   Lives With Alone      Prior Function   Vocation Retired    Environmental consultant 3 days at hospital, act around the house      AROM   Left Wrist Extension 70 Degrees    Left Wrist Flexion 80 Degrees    Left Wrist Radial Deviation 20 Degrees    Left Wrist Ulnar Deviation 30 Degrees      Left Hand AROM   L Thumb Radial ADduction/ABduction 0-55 44    L Thumb Palmar ADduction/ABduction 0-45 50    L Thumb Opposition to Index --   Opposition to 4th - pain  to 5th and [facility] ADD into CT   L Index  MCP 0-90 90 Degrees    L Index PIP 0-100 100 Degrees    L Long  MCP 0-90 90 Degrees    L Long PIP 0-100 100 Degrees    L Ring  MCP 0-90 90 Degrees    L Ring PIP 0-100 100 Degrees    L Little  MCP 0-90 90 Degrees    L Little PIP 0-100 100 Degrees                      OT Treatments/Exercises (OP) - 09/08/22 0001       LUE Fluidotherapy   Number Minutes Fluidotherapy 8 Minutes    LUE Fluidotherapy Location Hand;Wrist    Comments prior to review of HEP             Reviewed with patient and neighbor scar massage as well as Cica -Care scar pad provided to use at nighttime.  Handout provided. Patient to do palmar and radial abduction of thumb 10 reps to 3 times a day after moist heat As well as opposition focusing on oval initiating IP flexion to second through fourth.  Hold off on fifth if pain.  8 reps.  Patient to focus on opening thumb between opposition.   Patient to wear thumb spica at nighttime as well as with any heavy activities during the day or when out and about.  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  Not to push wheelchairs.    OT Education - 09/08/22 1135     Education Details Findings of eval and HEP    Person(s) Educated Patient;Other (comment)    Methods Explanation;Demonstration;Tactile cues;Verbal cues;Handout    Comprehension Verbal cues required;Returned demonstration;Verbalized  understanding              OT Short Term Goals - 09/08/22 1140       OT SHORT TERM GOAL #1   Title Pt to be ind in wearing of splint and HEP to keep pain under 2/10 with AROM improve to [facility] to initiate strengthening    Baseline Thumb spica off for few days and not sleeping- pain increase with thumb motion to 5th and collapsing/ADD into palm    Time 3    Period Weeks    Status New    Target Date 09/29/22               OT Long Term Goals - 09/08/22 1142       OT LONG TERM GOAL #1   Title Pt show increase thumb PA and RA WNL and strenght to pick up glass and turn doorknob without increase pain    Baseline 3 wks s/p - thumb PA 50 , and RA 44 degrees- cannot turn doorknob or hold glass    Time 6    Period Weeks    Status New    Target Date 10/20/22      OT LONG TERM GOAL #2   Title Wrist strenght 5/5 for pt to push and pull heavy door without increase symptoms    Baseline 3 wks s/p - pain with RD, UD - AROM WNL - strength decrease    Time 8    Period Weeks    Status New    Target Date 11/03/22      OT LONG TERM GOAL #3   Title L grip and prehension strength increase  to 75% compare to R to pull up pants, carry plate, push w/c and do laundery without increase symptoms    Baseline 3 wks s/p - splint still on - not using hand in anything resistance    Time 8    Period Weeks    Status New    Target Date 11/03/22                   Plan - 09/08/22 1138     Clinical Impression Statement Patient presented at OT evaluation 3 weeks.  Left CMC arthroplasty on 08/18/2022 by Dr. Rudene Christians.  Reminded patient to still wear thumb spica splint at nighttime as well as with heavy activities.  Patient wrist active range of motion within functional limits but with some discomfort at radial and ulnar deviation.  Thumb active range of motion in all planes decreased with some increase pain towards fifth digit.  Appears patient did not wear her thumb spica last few days with thumb  collapsing and adducting into the palm.  Patient to focus on opposite to second through fourth with keeping [place] out of the palm and focusing on oval.  Patient do have some memory issues-neighbor with her today.  Scar adhere and tender patient educated in scar management.  Patient limited in functional use of left hand because of increased scar tissue, pain and and stiffness as well as decrease strength  Patient can benefit from skilled OT services.    OT Occupational Profile and History Problem Focused Assessment - Including review of records relating to presenting problem    Occupational performance deficits (Please refer to evaluation for details): ADL's;IADL's;Play;Leisure    Body Structure / Function / Physical Skills ADL;IADL;Strength;Pain;Edema;Scar mobility;FMC;Dexterity;Decreased knowledge of precautions;ROM;UE functional use    Rehab Potential Good    Clinical Decision Making Several treatment options, min-mod task modification necessary   pt has some memory issues- neighbour with her   Comorbidities Affecting Occupational Performance: May have comorbidities impacting occupational performance   memory issues   Modification or Assistance to Complete Evaluation  Min-Moderate modification of tasks or assist with assess necessary to complete eval    OT Frequency 1x / week   increase to 2 wks wks when strengthening   OT Duration 8 weeks    OT Treatment/Interventions Self-care/ADL training;Contrast Bath;DME and/or AE instruction;Manual Therapy;Passive range of motion;Scar mobilization;Fluidtherapy;Paraffin;Therapeutic exercise;Splinting;Patient/family education    Consulted and Agree with Plan of Care Patient;Family member/caregiver             Patient will benefit from skilled therapeutic intervention in order to improve the following deficits and impairments:   Body Structure / Function / Physical Skills: ADL, IADL, Strength, Pain, Edema, Scar mobility, FMC, Dexterity, Decreased knowledge of  precautions, ROM, UE functional use       Visit Diagnosis: Stiffness of left hand, not elsewhere classified  Pain in left hand  Muscle weakness (generalized)    Problem List Patient Active Problem List   Diagnosis Date Noted   History of colonic polyps 11/26/2021   Primary osteoarthritis of first carpometacarpal joint of left hand 09/23/2021   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   B12 deficiency 05/24/2019   Primary osteoarthritis of left knee 04/16/2019   Pseudophakia of right eye 07/26/2017   S/P total knee arthroplasty 05/10/2017   Vitamin D deficiency, unspecified 12/10/2016   CVA (cerebral infarction) 12/18/2015   Acute renal insufficiency 12/18/2015   Hyperglycemia 12/18/2015   Tremor 12/18/2015  Transient cerebral ischemic attack 12/18/2015   Aortic atherosclerosis ([place]) 08/01/2015   Bilateral renal cysts 08/01/2015   Generalized osteoarthritis 08/01/2015   Left ovarian cyst 08/01/2015   Thumb pain 07/29/2015   Acute diverticulitis 07/01/2015   Adnexal mass 07/01/2015   Underweight 11/10/2014   Muscle cramps at night 11/10/2014   Complaints of leg weakness 11/10/2014   Medicare annual wellness visit, subsequent 04/18/2014   Prolapse of female pelvic organs 07/11/2013   Screening for breast cancer 10/29/2012   Tubular adenoma of colon 10/29/2012   Familial multiple lipoprotein-type hyperlipidemia    GERD (gastroesophageal reflux disease)    S/P colonoscopy    Barrett esophagus     Rosalyn Gess, OTR/L,CLT 09/08/2022, 11:50 AM  [facility] [address], [place], [address] Phone: [phone]   Fax:  [phone]  Name: Ronalda Ivanov MRN: [id number] Date of Birth: 23-May-1941

## 2022-09-15 ENCOUNTER — Ambulatory Visit: Payer: Medicare Other | Admitting: Occupational Therapy

## 2022-09-15 DIAGNOSIS — M25642 Stiffness of left hand, not elsewhere classified: Secondary | ICD-10-CM | POA: Diagnosis not present

## 2022-09-15 DIAGNOSIS — M79642 Pain in left hand: Secondary | ICD-10-CM

## 2022-09-15 DIAGNOSIS — M6281 Muscle weakness (generalized): Secondary | ICD-10-CM

## 2022-09-16 ENCOUNTER — Encounter: Payer: Self-pay | Admitting: Occupational Therapy

## 2022-09-16 NOTE — Therapy (Signed)
[facility] [address], [place], [address] Phone: [phone]   Fax:  [phone]  Occupational Therapy Treatment  Patient Details  Name: Karen Parsons MRN: [id number] Date of Birth: May 22, 1941 Referring Provider (OT): [place]   Encounter Date: 09/15/2022   OT End of Session - 09/16/22 0757     Visit Number 2    Number of Visits 12    Date for OT Re-Evaluation 11/03/22    OT Start Time 1115    OT Stop Time [id number]    OT Time Calculation (min) 50 min    Activity Tolerance Patient tolerated treatment well    Behavior During Therapy [facility] for tasks assessed/performed             Past Medical History:  Diagnosis Date   Barrett esophagus    2018.  dr. Adria Devon said this has resolved after a recent endoscopy   Barrett's esophagus 12/2008   [place],    Benign neoplasm of colon    Bilateral renal cysts    Colon polyps    Cramp of limb    Diverticulosis    Generalized osteoarthritis    Genital prolapse    GERD (gastroesophageal reflux disease)    H/O seasonal allergies    Hallux valgus    Hemorrhoids    History of hiatal hernia    Hyperlipidemia    intolerant of statins, normal diagnostic cath July 2010 Nehemiah Massed)   Left ovarian cyst    Osteoporosis    by DEXA 2008   S/P colonoscopy 2011   [name], polyp removed   Stroke ([place]) 11/2015   TIA. no deficits    Past Surgical History:  Procedure Laterality Date   ABDOMINAL HYSTERECTOMY     age 81, menorrhagia   APPENDECTOMY     arthroscopy of knee Right    CARDIAC CATHETERIZATION Left 12/2008   [name]:  normal   CARPOMETACARPAL ([place]) FUSION OF THUMB Left 08/18/2022   Procedure: Left thumb carpometacarpal arthroplasty;  Surgeon: Hessie Knows, MD;  Location: [facility];  Service: Orthopedics;  Laterality: Left;   CATARACT EXTRACTION Left 2010   [name]   COLONOSCOPY WITH PROPOFOL N/A 01/07/2015   Procedure: COLONOSCOPY WITH PROPOFOL;   Surgeon: Manya Silvas, MD;  Location: [facility] ENDOSCOPY;  Service: Endoscopy;  Laterality: N/A;   ESOPHAGOGASTRODUODENOSCOPY N/A 01/07/2015   Procedure: ESOPHAGOGASTRODUODENOSCOPY (EGD);  Surgeon: Manya Silvas, MD;  Location: [facility] ENDOSCOPY;  Service: Endoscopy;  Laterality: N/A;   ESOPHAGOGASTRODUODENOSCOPY (EGD) WITH PROPOFOL N/A 05/23/2015   Procedure: ESOPHAGOGASTRODUODENOSCOPY (EGD) WITH PROPOFOL;  Surgeon: Manya Silvas, MD;  Location: [facility] ENDOSCOPY;  Service: Endoscopy;  Laterality: N/A;   ESOPHAGOGASTRODUODENOSCOPY (EGD) WITH PROPOFOL N/A 07/11/2015   Procedure: ESOPHAGOGASTRODUODENOSCOPY (EGD) WITH PROPOFOL with Barrx;  Surgeon: [name], MD;  Location: [facility] ENDOSCOPY;  Service: Gastroenterology;  Laterality: N/A;   ESOPHAGOGASTRODUODENOSCOPY (EGD) WITH PROPOFOL N/A 09/12/2015   Procedure: ESOPHAGOGASTRODUODENOSCOPY (EGD) WITH PROPOFOL;  Surgeon: [name], MD;  Location: [facility] ENDOSCOPY;  Service: Gastroenterology;  Laterality: N/A;   ESOPHAGOGASTRODUODENOSCOPY (EGD) WITH PROPOFOL N/A 11/14/2015   Procedure: ESOPHAGOGASTRODUODENOSCOPY (EGD) WITH PROPOFOL;  Surgeon: [name], MD;  Location: [facility] ENDOSCOPY;  Service: Gastroenterology;  Laterality: N/A;   EYE SURGERY     HEMORRHOID SURGERY  10/02/2013   by simple ligation   JOINT REPLACEMENT Bilateral    knees   KNEE ARTHROPLASTY Right 05/10/2017   Procedure: COMPUTER ASSISTED TOTAL KNEE ARTHROPLASTY;  Surgeon: Dereck Leep, MD;  Location: [facility] ORS;  Service: Orthopedics;  Laterality: Right;   KNEE ARTHROPLASTY Left 09/25/2019   Procedure: COMPUTER ASSISTED TOTAL KNEE ARTHROPLASTY;  Surgeon: Dereck Leep, MD;  Location: [facility] ORS;  Service: Orthopedics;  Laterality: Left;   medial meniscotomy Left 2007   [name]   OSTEOTOMY TOES Right 2008   [name]   TONSILLECTOMY      There were no vitals filed for this visit.   Subjective Assessment - 09/16/22 0754     Subjective  Pt accompanied this date by her neighbor.   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 Denies any pain this date, Pt asking for more cica care and wanting to know how it helps her scar.    Pertinent History Karen Parsons is a 82 y.o. female who presents today status post left thumb CMC arthroplasty by Dr. Hessie Knows on 08/18/2022. Patient is doing well. Her pain is well-controlled. No numbness or tingling. No warmth redness or drainage. Swelling is significantly improved from previous visit.Left upper extremity shows CMC incision sites are intact with sutures. Sutures moved, Steri-Strips applied. She has little to no swelling throughout the hand or digits. She is moving the thumb and digits well with no signs of pain or discomfort. No warmth redness or drainage along the incision sites.  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 Continue with thumb spica brace during the day and nighttime for 1 more week. In 1 week she can transition to wearing the brace only at night for an additional 3 weeks. 2. Start hand therapy 3. Okay to begin showering allow Steri-Strips to come off on their own 4. Follow-up with Dr. 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 No/denies    Pain Score 0-No pain    Pain Onset 1 to 4 weeks ago            Fluidotherapy:  Fluidotherapy to left hand and wrist to decrease pain, increase tissue mobility and increase ROM.  Performed for 10 mins and pt instructed to perform AROM of left hand and wrist while in fluido.    Manual Therapy:  Following fluidotherapy, pt seen for manual therapy techniques applied by therapist   Manual massage with carpal/metacarpal spreads to encourage lymphatic flow, soft tissue massage for scar  management, webspace. Use of mini vibration over scars and xtractor.    Therapeutic Exercises:   Patient performing palmar and radial abduction of thumb 10 reps Opposition focusing on oval initiating IP flexion to second through fourth. No pain with opposition this date and able to perform to small finger.  Patient to focus on opening thumb between opposition. Advised pt to watch pain when performing opposition to small finger and back off if pain increases.     Patient to continue to wear thumb spica at nighttime as well as with any heavy activities during the day or when out and about and when she is working 1 day a week for volunteer at the hospital.  Recommend she not push wheelchairs. Issued additional cica care and a sleeve to wear over it at night to keep scar pad in place.  Educated patient and neighbor on benefits  of CICA care for scar management since they had specific questions about it this date.        OT Education - 09/16/22 0756     Education Details changes and progression with HEP, use of CICA care for scar management.    Person(s) Educated Patient;Other (comment)    Methods Explanation;Demonstration;Tactile cues;Verbal cues;Handout    Comprehension Verbal cues required;Returned demonstration;Verbalized understanding              OT Short Term Goals - 09/08/22 1140       OT SHORT TERM GOAL #1   Title Pt to be ind in wearing of splint and HEP to keep pain under 2/10 with AROM improve to [facility] to initiate strengthening    Baseline Thumb spica off for few days and not sleeping- pain increase with thumb motion to 5th and collapsing/ADD into palm    Time 3    Period Weeks    Status New    Target Date 09/29/22               OT Long Term Goals - 09/08/22 1142       OT LONG TERM GOAL #1   Title Pt show increase thumb PA and RA WNL and strenght to pick up glass and turn doorknob without increase pain    Baseline 3 wks s/p - thumb PA 50 , and RA 44 degrees- cannot  turn doorknob or hold glass    Time 6    Period Weeks    Status New    Target Date 10/20/22      OT LONG TERM GOAL #2   Title Wrist strenght 5/5 for pt to push and pull heavy door without increase symptoms    Baseline 3 wks s/p - pain with RD, UD - AROM WNL - strength decrease    Time 8    Period Weeks    Status New    Target Date 11/03/22      OT LONG TERM GOAL #3   Title L grip and prehension strength increase to 75% compare to R to pull up pants, carry plate, push w/c and do laundery without increase symptoms    Baseline 3 wks s/p - splint still on - not using hand in anything resistance    Time 8    Period Weeks    Status New    Target Date 11/03/22                   Plan - 09/16/22 0757     Clinical Impression Statement Patient presented at OT evaluation 3 weeks.  Left CMC arthroplasty on 08/18/2022 by Dr. Rudene Christians.  Reminded patient to still wear thumb spica splint at nighttime as well as with heavy activities.  Patient wrist active range of motion within functional limits but with some discomfort at radial and ulnar deviation.  Thumb active range of motion in all planes decreased with some increase pain towards fifth digit which improved this date.  Patient to focus on opposition to second through fourth with keeping [facility] out of the palm and focusing on oval.  Able to demonstrate opposition this date to small finger without pain and cues for proper technique. Patient with memory issues-neighbor with her today who helps to remind her about her exercises and instructions from therapy. .  Scar adhered and remains tender,  patient re-educated in scar management.  Patient limited in functional use of left hand because of increased scar tissue, pain and  and stiffness as well as decrease strength  Patient can benefit from skilled OT services to maximize safety and independence in necessary daily tasks.    OT Occupational Profile and History Problem Focused Assessment - Including review  of records relating to presenting problem    Occupational performance deficits (Please refer to evaluation for details): ADL's;IADL's;Play;Leisure    Body Structure / Function / Physical Skills ADL;IADL;Strength;Pain;Edema;Scar mobility;FMC;Dexterity;Decreased knowledge of precautions;ROM;UE functional use    Rehab Potential Good    Clinical Decision Making Several treatment options, min-mod task modification necessary   pt has some memory issues- neighbour with her   Comorbidities Affecting Occupational Performance: May have comorbidities impacting occupational performance   memory issues   Modification or Assistance to Complete Evaluation  Min-Moderate modification of tasks or assist with assess necessary to complete eval    OT Frequency 1x / week   increase to 2 wks wks when strengthening   OT Duration 8 weeks    OT Treatment/Interventions Self-care/ADL training;Contrast Bath;DME and/or AE instruction;Manual Therapy;Passive range of motion;Scar mobilization;Fluidtherapy;Paraffin;Therapeutic exercise;Splinting;Patient/family education    Consulted and Agree with Plan of Care Patient;Family member/caregiver             Patient will benefit from skilled therapeutic intervention in order to improve the following deficits and impairments:   Body Structure / Function / Physical Skills: ADL, IADL, Strength, Pain, Edema, Scar mobility, FMC, Dexterity, Decreased knowledge of precautions, ROM, UE functional use       Visit Diagnosis: Stiffness of left hand, not elsewhere classified  Pain in left hand  Muscle weakness (generalized)    Problem List Patient Active Problem List   Diagnosis Date Noted   History of colonic polyps 11/26/2021   Primary osteoarthritis of first carpometacarpal joint of left hand 09/23/2021   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   B12 deficiency 05/24/2019   Primary osteoarthritis of left knee 04/16/2019    Pseudophakia of right eye 07/26/2017   S/P total knee arthroplasty 05/10/2017   Vitamin D deficiency, unspecified 12/10/2016   CVA (cerebral infarction) 12/18/2015   Acute renal insufficiency 12/18/2015   Hyperglycemia 12/18/2015   Tremor 12/18/2015   Transient cerebral ischemic attack 12/18/2015   Aortic atherosclerosis ([place]) 08/01/2015   Bilateral renal cysts 08/01/2015   Generalized osteoarthritis 08/01/2015   Left ovarian cyst 08/01/2015   Thumb pain 07/29/2015   Acute diverticulitis 07/01/2015   Adnexal mass 07/01/2015   Underweight 11/10/2014   Muscle cramps at night 11/10/2014   Complaints of leg weakness 11/10/2014   Medicare annual wellness visit, subsequent 04/18/2014   Prolapse of female pelvic organs 07/11/2013   Screening for breast cancer 10/29/2012   Tubular adenoma of colon 10/29/2012   Familial multiple lipoprotein-type hyperlipidemia    GERD (gastroesophageal reflux disease)    S/P colonoscopy    Barrett esophagus    Karen Parsons, OTR/L, CLT  Karen Parsons, OT 09/16/2022, 8:12 AM  [facility] [address] [place], [address] Phone: [phone]   Fax:  [phone]  Name: Karen Parsons MRN: [id number] Date of Birth: 06-17-1941

## 2022-09-22 ENCOUNTER — Ambulatory Visit: Payer: Medicare Other | Attending: Orthopedic Surgery | Admitting: Occupational Therapy

## 2022-09-22 DIAGNOSIS — M6281 Muscle weakness (generalized): Secondary | ICD-10-CM | POA: Diagnosis present

## 2022-09-22 DIAGNOSIS — M79642 Pain in left hand: Secondary | ICD-10-CM

## 2022-09-22 DIAGNOSIS — M25642 Stiffness of left hand, not elsewhere classified: Secondary | ICD-10-CM | POA: Diagnosis not present

## 2022-09-22 NOTE — Therapy (Signed)
[facility] [address], [place], [address] Phone: [phone]   Fax:  [phone]  Occupational Therapy Treatment  Patient Details  Name: Karen Parsons MRN: [id number] Date of Birth: 1941-01-12 Referring Provider (OT): [place]   Encounter Date: 09/22/2022   OT End of Session - 09/22/22 1259     Visit Number 3    Number of Visits 12    Date for OT Re-Evaluation 11/03/22    OT Start Time 1035    OT Stop Time 1120    OT Time Calculation (min) 45 min    Activity Tolerance Patient tolerated treatment well    Behavior During Therapy [facility] for tasks assessed/performed             Past Medical History:  Diagnosis Date   Barrett esophagus    2018.  dr. Adria Devon said this has resolved after a recent endoscopy   Barrett's esophagus 12/2008   [place],    Benign neoplasm of colon    Bilateral renal cysts    Colon polyps    Cramp of limb    Diverticulosis    Generalized osteoarthritis    Genital prolapse    GERD (gastroesophageal reflux disease)    H/O seasonal allergies    Hallux valgus    Hemorrhoids    History of hiatal hernia    Hyperlipidemia    intolerant of statins, normal diagnostic cath July 2010 Nehemiah Massed)   Left ovarian cyst    Osteoporosis    by DEXA 2008   S/P colonoscopy 2011   [name], polyp removed   Stroke ([place]) 11/2015   TIA. no deficits    Past Surgical History:  Procedure Laterality Date   ABDOMINAL HYSTERECTOMY     age 12, menorrhagia   APPENDECTOMY     arthroscopy of knee Right    CARDIAC CATHETERIZATION Left 12/2008   [name]:  normal   CARPOMETACARPAL ([place]) FUSION OF THUMB Left 08/18/2022   Procedure: Left thumb carpometacarpal arthroplasty;  Surgeon: Hessie Knows, MD;  Location: [facility];  Service: Orthopedics;  Laterality: Left;   CATARACT EXTRACTION Left 2010   [name]   COLONOSCOPY WITH PROPOFOL N/A 01/07/2015   Procedure: COLONOSCOPY WITH PROPOFOL;  Surgeon:  Manya Silvas, MD;  Location: [facility] ENDOSCOPY;  Service: Endoscopy;  Laterality: N/A;   ESOPHAGOGASTRODUODENOSCOPY N/A 01/07/2015   Procedure: ESOPHAGOGASTRODUODENOSCOPY (EGD);  Surgeon: Manya Silvas, MD;  Location: [facility] ENDOSCOPY;  Service: Endoscopy;  Laterality: N/A;   ESOPHAGOGASTRODUODENOSCOPY (EGD) WITH PROPOFOL N/A 05/23/2015   Procedure: ESOPHAGOGASTRODUODENOSCOPY (EGD) WITH PROPOFOL;  Surgeon: Manya Silvas, MD;  Location: [facility] ENDOSCOPY;  Service: Endoscopy;  Laterality: N/A;   ESOPHAGOGASTRODUODENOSCOPY (EGD) WITH PROPOFOL N/A 07/11/2015   Procedure: ESOPHAGOGASTRODUODENOSCOPY (EGD) WITH PROPOFOL with Barrx;  Surgeon: [name], MD;  Location: [facility]COPY;  Service: Gastroenterology;  Laterality: N/A;   ESOPHAGOGASTRODUODENOSCOPY (EGD) WITH PROPOFOL N/A 09/12/2015   Procedure: ESOPHAGOGASTRODUODENOSCOPY (EGD) WITH PROPOFOL;  Surgeon: [name], MD;  Location: [facility] ENDOSCOPY;  Service: Gastroenterology;  Laterality: N/A;   ESOPHAGOGASTRODUODENOSCOPY (EGD) WITH PROPOFOL N/A 11/14/2015   Procedure: ESOPHAGOGASTRODUODENOSCOPY (EGD) WITH PROPOFOL;  Surgeon: [name], MD;  Location: [facility] ENDOSCOPY;  Service: Gastroenterology;  Laterality: N/A;   EYE SURGERY     HEMORRHOID SURGERY  10/02/2013   by simple ligation   JOINT REPLACEMENT Bilateral    knees   KNEE ARTHROPLASTY Right 05/10/2017   Procedure: COMPUTER ASSISTED TOTAL KNEE ARTHROPLASTY;  Surgeon: Dereck Leep, MD;  Location: [facility] ORS;  Service: Orthopedics;  Laterality: Right;   KNEE ARTHROPLASTY Left 09/25/2019   Procedure: COMPUTER ASSISTED TOTAL KNEE ARTHROPLASTY;  Surgeon: Dereck Leep, MD;  Location: [facility];  Service: Orthopedics;  Laterality: Left;   medial meniscotomy Left 2007   [name]   OSTEOTOMY TOES Right 2008   [name]   TONSILLECTOMY      There were no vitals filed for this visit.   Subjective Assessment - 09/22/22 1256     Subjective  I am not wearing any splints anymore - and using my  hand but my thumb hurts at times -with certain tasks- my memory is not good    Pertinent History Karen Parsons is a 82 y.o. female who presents today status post left thumb CMC arthroplasty by Dr. Hessie Knows on 08/18/2022. Patient is doing well. Her pain is well-controlled. No numbness or tingling. No warmth redness or drainage. Swelling is significantly improved from previous visit.Left upper extremity shows CMC incision sites are intact with sutures. Sutures moved, Steri-Strips applied. She has little to no swelling throughout the hand or digits. She is moving the thumb and digits well with no signs of pain or discomfort. No warmth redness or drainage along the incision sites.  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 Continue with thumb spica brace during the day and nighttime for 1 more week. In 1 week she can transition to wearing the brace only at night for an additional 3 weeks. 2. Start hand therapy 3. Okay to begin showering allow Steri-Strips to come off on their own 4. Follow-up with Dr. 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 No/denies                [facility] OT Assessment - 09/22/22 0001       AROM   Left Wrist Extension 70 Degrees    Left Wrist Flexion 80 Degrees    Left Wrist Radial Deviation 20 Degrees    Left Wrist Ulnar Deviation 30 Degrees      Strength   Right Hand Grip (lbs) 40    Right Hand Lateral Pinch 10 lbs    Right Hand 3 Point Pinch 9 lbs    Left Hand Grip (lbs) 30    Left Hand Lateral Pinch 6 lbs    Left Hand 3 Point Pinch 5 lbs                      OT Treatments/Exercises (OP) - 09/22/22 0001       LUE Paraffin   Number Minutes Paraffin 8 Minutes    LUE Paraffin Location  Hand;Wrist    Comments prior to scar massage and strengthening            scar massage done and xtractor -as well as mini massager Scar still adhere -kinesiotape done X over scar for scar mobs - precautions ed on and to keep on for 48hrs Patient to do palmar and radial abduction of thumb 10 reps to 3 times  Add rubber band for PA and RA - 2 x 12 reps  Pain free - 2 x day  Cont opposition focusing on oval initiating IP flexion to  second through 5th Opening thumb in between     16 oz hammer or 1 lbs weight for wrist and forearm in all planes  15 reps- 2x day pain free  Can increase Friday to 2nd set  To call surgeon to make appt for 6 wks check up          OT Education - 09/22/22 1258     Education Details changes and progression with HEP,  scar management.    Person(s) Educated Patient;Other (comment)    Methods Explanation;Demonstration;Tactile cues;Verbal cues;Handout    Comprehension Verbal cues required;Returned demonstration;Verbalized understanding              OT Short Term Goals - 09/08/22 1140       OT SHORT TERM GOAL #1   Title Pt to be ind in wearing of splint and HEP to keep pain under 2/10 with AROM improve to [facility] to initiate strengthening    Baseline Thumb spica off for few days and not sleeping- pain increase with thumb motion to 5th and collapsing/ADD into palm    Time 3    Period Weeks    Status New    Target Date 09/29/22               OT Long Term Goals - 09/08/22 1142       OT LONG TERM GOAL #1   Title Pt show increase thumb PA and RA WNL and strenght to pick up glass and turn doorknob without increase pain    Baseline 3 wks s/p - thumb PA 50 , and RA 44 degrees- cannot turn doorknob or hold glass    Time 6    Period Weeks    Status New    Target Date 10/20/22      OT LONG TERM GOAL #2   Title Wrist strenght 5/5 for pt to push and pull heavy door without increase symptoms    Baseline 3 wks s/p - pain with RD, UD - AROM WNL -  strength decrease    Time 8    Period Weeks    Status New    Target Date 11/03/22      OT LONG TERM GOAL #3   Title L grip and prehension strength increase to 75% compare to R to pull up pants, carry plate, push w/c and do laundery without increase symptoms    Baseline 3 wks s/p - splint still on - not using hand in anything resistance    Time 8    Period Weeks    Status New    Target Date 11/03/22                   Plan - 09/22/22 1259     Clinical Impression Statement Patient presented at OT evaluation 3 weeks.  Left CMC arthroplasty on 08/18/2022 by Dr. Rudene Christians. Pt now 5 wks s/p and not wearing any splints.   L thumb AROM and wrist WNL - and pt report pain with use at times but unable to name tasks. Patient with memory issues-neighbor with her today who helps to remind her about her exercises and instructions from therapy.  Initated strenghtening this date for thumb PA and RA -as well as 1 lbs weight or 16oz hammer for HEP for thumb and wrist -pain free. Also remind them to call [facility] for appt for 6 wks s/p visit.  Scar adhered and remains tender,  patient re-educated in scar management and taped iwth kinesiotape this  date.  Patient limited in functional use of left hand because of increased scar tissue, pain and and stiffness as well as decrease strength  Patient can benefit from skilled OT services to maximize safety and independence in necessary daily tasks.    OT Occupational Profile and History Problem Focused Assessment - Including review of records relating to presenting problem    Occupational performance deficits (Please refer to evaluation for details): ADL's;IADL's;Play;Leisure    Body Structure / Function / Physical Skills ADL;IADL;Strength;Pain;Edema;Scar mobility;FMC;Dexterity;Decreased knowledge of precautions;ROM;UE functional use    Rehab Potential Good    Clinical Decision Making Several treatment options, min-mod task modification necessary    Comorbidities  Affecting Occupational Performance: May have comorbidities impacting occupational performance    Modification or Assistance to Complete Evaluation  Min-Moderate modification of tasks or assist with assess necessary to complete eval    OT Frequency 1x / week    OT Duration 8 weeks    OT Treatment/Interventions Self-care/ADL training;Contrast Bath;DME and/or AE instruction;Manual Therapy;Passive range of motion;Scar mobilization;Fluidtherapy;Paraffin;Therapeutic exercise;Splinting;Patient/family education    Consulted and Agree with Plan of Care Patient;Family member/caregiver             Patient will benefit from skilled therapeutic intervention in order to improve the following deficits and impairments:   Body Structure / Function / Physical Skills: ADL, IADL, Strength, Pain, Edema, Scar mobility, FMC, Dexterity, Decreased knowledge of precautions, ROM, UE functional use       Visit Diagnosis: Stiffness of left hand, not elsewhere classified  Pain in left hand  Muscle weakness (generalized)    Problem List Patient Active Problem List   Diagnosis Date Noted   History of colonic polyps 11/26/2021   Primary osteoarthritis of first carpometacarpal joint of left hand 09/23/2021   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   B12 deficiency 05/24/2019   Primary osteoarthritis of left knee 04/16/2019   Pseudophakia of right eye 07/26/2017   S/P total knee arthroplasty 05/10/2017   Vitamin D deficiency, unspecified 12/10/2016   CVA (cerebral infarction) 12/18/2015   Acute renal insufficiency 12/18/2015   Hyperglycemia 12/18/2015   Tremor 12/18/2015   Transient cerebral ischemic attack 12/18/2015   Aortic atherosclerosis 08/01/2015   Bilateral renal cysts 08/01/2015   Generalized osteoarthritis 08/01/2015   Left ovarian cyst 08/01/2015   Thumb pain 07/29/2015   Acute diverticulitis 07/01/2015   Adnexal mass 07/01/2015    Underweight 11/10/2014   Muscle cramps at night 11/10/2014   Complaints of leg weakness 11/10/2014   Medicare annual wellness visit, subsequent 04/18/2014   Prolapse of female pelvic organs 07/11/2013   Screening for breast cancer 10/29/2012   Tubular adenoma of colon 10/29/2012   Familial multiple lipoprotein-type hyperlipidemia    GERD (gastroesophageal reflux disease)    S/P colonoscopy    Barrett esophagus     Rosalyn Gess, OTR/L,CLT 09/22/2022, 4:17 PM  [facility] [address], [place], [address] Phone: [phone]   Fax:  [phone]  Name: Karen Parsons MRN: [id number] Date of Birth: 08-Apr-1941

## 2022-09-29 ENCOUNTER — Encounter: Payer: Self-pay | Admitting: Occupational Therapy

## 2022-09-29 ENCOUNTER — Ambulatory Visit: Payer: Medicare Other | Admitting: Occupational Therapy

## 2022-09-29 DIAGNOSIS — M25642 Stiffness of left hand, not elsewhere classified: Secondary | ICD-10-CM | POA: Diagnosis not present

## 2022-09-29 DIAGNOSIS — M79642 Pain in left hand: Secondary | ICD-10-CM

## 2022-09-29 NOTE — Therapy (Signed)
[facility] [address] [place], [place], [address] Phone: [phone]   Fax:  [phone]  Occupational Therapy Treatment  Patient Details  Name: Karen Parsons MRN: [id number] Date of Birth: Oct 23, 1940 Referring Provider (OT): [place]   Encounter Date: 09/29/2022   OT End of Session - 09/29/22 1044     Visit Number 4    Number of Visits 12    Date for OT Re-Evaluation 11/03/22    OT Start Time 0950    OT Stop Time 1033    OT Time Calculation (min) 43 min    Activity Tolerance Patient tolerated treatment well    Behavior During Therapy [facility] for tasks assessed/performed             Past Medical History:  Diagnosis Date   Barrett esophagus    2018.  dr. Enrigue Parsons said this has resolved after a recent endoscopy   Barrett's esophagus 12/2008   Karen Parsons,    Benign neoplasm of colon    Bilateral renal cysts    Colon polyps    Cramp of limb    Diverticulosis    Generalized osteoarthritis    Genital prolapse    GERD (gastroesophageal reflux disease)    H/O seasonal allergies    Hallux valgus    Hemorrhoids    History of hiatal hernia    Hyperlipidemia    intolerant of statins, normal diagnostic cath July 2010 Karen Parsons)   Left ovarian cyst    Osteoporosis    by DEXA 2008   S/P colonoscopy 2011   Karen Parsons, polyp removed   Stroke 11/2015   TIA. no deficits    Past Surgical History:  Procedure Laterality Date   ABDOMINAL HYSTERECTOMY     age 16, menorrhagia   APPENDECTOMY     arthroscopy of knee Right    CARDIAC CATHETERIZATION Left 12/2008   [name]:  normal   CARPOMETACARPAL (CMC) FUSION OF THUMB Left 08/18/2022   Procedure: Left thumb carpometacarpal arthroplasty;  Surgeon: Karen Bucker, MD;  Location: [facility];  Service: Orthopedics;  Laterality: Left;   CATARACT EXTRACTION Left 2010   Karen Parsons   COLONOSCOPY WITH PROPOFOL N/A 01/07/2015   Procedure: COLONOSCOPY WITH PROPOFOL;  Surgeon:  Scot Jun, MD;  Location: [facility] ENDOSCOPY;  Service: Endoscopy;  Laterality: N/A;   ESOPHAGOGASTRODUODENOSCOPY N/A 01/07/2015   Procedure: ESOPHAGOGASTRODUODENOSCOPY (EGD);  Surgeon: Scot Jun, MD;  Location: [facility] ENDOSCOPY;  Service: Endoscopy;  Laterality: N/A;   ESOPHAGOGASTRODUODENOSCOPY (EGD) WITH PROPOFOL N/A 05/23/2015   Procedure: ESOPHAGOGASTRODUODENOSCOPY (EGD) WITH PROPOFOL;  Surgeon: Scot Jun, MD;  Location: [facility] ENDOSCOPY;  Service: Endoscopy;  Laterality: N/A;   ESOPHAGOGASTRODUODENOSCOPY (EGD) WITH PROPOFOL N/A 07/11/2015   Procedure: ESOPHAGOGASTRODUODENOSCOPY (EGD) WITH PROPOFOL with Barrx;  Surgeon: Wallace Cullens, MD;  Location: [facility] ENDOSCOPY;  Service: Gastroenterology;  Laterality: N/A;   ESOPHAGOGASTRODUODENOSCOPY (EGD) WITH PROPOFOL N/A 09/12/2015   Procedure: ESOPHAGOGASTRODUODENOSCOPY (EGD) WITH PROPOFOL;  Surgeon: Wallace Cullens, MD;  Location: [facility] ENDOSCOPY;  Service: Gastroenterology;  Laterality: N/A;   ESOPHAGOGASTRODUODENOSCOPY (EGD) WITH PROPOFOL N/A 11/14/2015   Procedure: ESOPHAGOGASTRODUODENOSCOPY (EGD) WITH PROPOFOL;  Surgeon: Wallace Cullens, MD;  Location: [facility] ENDOSCOPY;  Service: Gastroenterology;  Laterality: N/A;   EYE SURGERY     HEMORRHOID SURGERY  10/02/2013   by simple ligation   JOINT REPLACEMENT Bilateral    knees   KNEE ARTHROPLASTY Right 05/10/2017   Procedure: COMPUTER ASSISTED TOTAL KNEE ARTHROPLASTY;  Surgeon: Donato Heinz, MD;  Location:  [facility] ORS;  Service: Orthopedics;  Laterality: Right;   KNEE ARTHROPLASTY Left 09/25/2019   Procedure: COMPUTER ASSISTED TOTAL KNEE ARTHROPLASTY;  Surgeon: Donato HeinzHooten, Karen P, MD;  Location: [facility] ORS;  Service: Orthopedics;  Laterality: Left;   medial meniscotomy Left 2007   Karen ChardChris Parsons   OSTEOTOMY TOES Right 2008   Karen Parsons   TONSILLECTOMY      There were no vitals filed for this visit.   Subjective Assessment - 09/29/22 1042     Subjective  I am using my hand more.  The pain is better at my  scar.  I did keep the tape on.  Done the exercises.  No trouble with putting a jewelry on, doing my zip or tie my shoes    Pertinent History Karen NajjarRosalie Parsons is a 82 y.o. female who presents today status post left thumb CMC arthroplasty by Dr. Kennedy BuckerMichael Menz on 08/18/2022. Patient is doing well. Her pain is well-controlled. No numbness or tingling. No warmth redness or drainage. Swelling is significantly improved from previous visit.Left upper extremity shows CMC incision sites are intact with sutures. Sutures moved, Steri-Strips applied. She has little to no swelling throughout the hand or digits. She is moving the thumb and digits well with no signs of pain or discomfort. No warmth redness or drainage along the incision sites.  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 Continue with thumb spica brace during the day and nighttime for 1 more week. In 1 week she can transition to wearing the brace only at night for an additional 3 weeks. 2. Start hand therapy 3. Okay to begin showering allow Steri-Strips to come off on their own 4. 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 No/denies                [facility] - 09/29/22 0001       AROM   Left Wrist Extension 770 Degrees    Left Wrist Flexion 80 Degrees    Left Wrist Radial Deviation 20 Degrees    Left Wrist Ulnar Deviation 30 Degrees      Strength   Right Hand Grip (lbs) 40    Right Hand Lateral Pinch 10 lbs    Right Hand 3 Point Pinch 9 lbs    Left Hand Grip (lbs) 32    Left Hand Lateral Pinch 5 lbs    Left Hand 3 Point Pinch 7 lbs             Removed condition type from scar.  Scar adhesion improving. Less pain. Grip and prehension improved  without strengthening using putty. Wrist flexion /extension as well as radial /ulnar deviation 4+/5 strength Supination/ pronation good forearm strength but not engaging thumb.         OT Treatments/Exercises (OP) - 09/29/22 0001       LUE Paraffin   Number Minutes Paraffin 8 Minutes    LUE Paraffin Location Hand;Wrist    Comments prior to scar massage and ROM            scar massage done and xtractor -as well as mini massager Scar still adhere -kinesiotape done X over scar for scar mobs - precautions ed  on and to keep on for 48hrs Patient to do palmar and radial abduction of thumb 10 reps to 3 times  Cont rubber band for PA and RA - 2 x 12 reps  Pain free - 2 x day  Cont opposition focusing on oval initiating IP flexion to second through 5th Opening thumb in between -8 reps     Cont with 16 oz hammer or 1 lbs weight for wrist and forearm in all planes  15 reps- 2x day pain free Add medium teal putty for gripping 15 reps pain-free 2 times a day  Reviewed with patient teal putty for lateral and 3-point pinch patient had a hard time with strict plane during lateral pinch.  12 reps.   Review and reinforce for friend to be with patient when doing these 2 prehension strength with putty.   Need to follow-up again this week to assess quality of home exercises and strengthening without increased pain.           OT Education - 09/29/22 1043     Education Details changes and progression with HEP,  scar management.    Person(s) Educated Patient;Other (comment)    Methods Explanation;Demonstration;Tactile cues;Verbal cues;Handout    Comprehension Verbal cues required;Returned demonstration;Verbalized understanding              OT Short Term Goals - 09/08/22 1140       OT SHORT TERM GOAL #1   Title Pt to be ind in wearing of splint and HEP to keep pain under 2/10 with AROM improve to [facility] to initiate strengthening    Baseline Thumb spica off for few days and not  sleeping- pain increase with thumb motion to 5th and collapsing/ADD into palm    Time 3    Period Weeks    Status New    Target Date 09/29/22               OT Long Term Goals - 09/08/22 1142       OT LONG TERM GOAL #1   Title Pt show increase thumb PA and RA WNL and strenght to pick up glass and turn doorknob without increase pain    Baseline 3 wks s/p - thumb PA 50 , and RA 44 degrees- cannot turn doorknob or hold glass    Time 6    Period Weeks    Status New    Target Date 10/20/22      OT LONG TERM GOAL #2   Title Wrist strenght 5/5 for pt to push and pull heavy door without increase symptoms    Baseline 3 wks s/p - pain with RD, UD - AROM WNL - strength decrease    Time 8    Period Weeks    Status New    Target Date 11/03/22      OT LONG TERM GOAL #3   Title L grip and prehension strength increase to 75% compare to R to pull up pants, carry plate, push w/c and do laundery without increase symptoms    Baseline 3 wks s/p - splint still on - not using hand in anything resistance    Time 8    Period Weeks    Status New    Target Date 11/03/22                   Plan - 09/29/22 1044     OT Occupational Profile and History Problem Focused Assessment - Including review of records relating to  presenting problem    Occupational performance deficits (Please refer to evaluation for details): ADL's;IADL's;Play;Leisure    Body Structure / Function / Physical Skills ADL;IADL;Strength;Pain;Edema;Scar mobility;FMC;Dexterity;Decreased knowledge of precautions;ROM;UE functional use    Rehab Potential Good    Clinical Decision Making Several treatment options, min-mod task modification necessary    Comorbidities Affecting Occupational Performance: May have comorbidities impacting occupational performance    Modification or Assistance to Complete Evaluation  Min-Moderate modification of tasks or assist with assess necessary to complete eval    OT Frequency 2x / week   1-2 x  wk dep on progress   OT Duration 8 weeks    OT Treatment/Interventions Self-care/ADL training;Contrast Bath;DME and/or AE instruction;Manual Therapy;Passive range of motion;Scar mobilization;Fluidtherapy;Paraffin;Therapeutic exercise;Splinting;Patient/family education    Consulted and Agree with Plan of Care Patient;Family member/caregiver             Patient will benefit from skilled therapeutic intervention in order to improve the following deficits and impairments:   Body Structure / Function / Physical Skills: ADL, IADL, Strength, Pain, Edema, Scar mobility, FMC, Dexterity, Decreased knowledge of precautions, ROM, UE functional use       Visit Diagnosis: Pain in left hand  Stiffness of left hand, not elsewhere classified    Problem List Patient Active Problem List   Diagnosis Date Noted   History of colonic polyps 11/26/2021   Primary osteoarthritis of first carpometacarpal joint of left hand 09/23/2021   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   B12 deficiency 05/24/2019   Primary osteoarthritis of left knee 04/16/2019   Pseudophakia of right eye 07/26/2017   S/P total knee arthroplasty 05/10/2017   Vitamin D deficiency, unspecified 12/10/2016   CVA (cerebral infarction) 12/18/2015   Acute renal insufficiency 12/18/2015   Hyperglycemia 12/18/2015   Tremor 12/18/2015   Transient cerebral ischemic attack 12/18/2015   Aortic atherosclerosis 08/01/2015   Bilateral renal cysts 08/01/2015   Generalized osteoarthritis 08/01/2015   Left ovarian cyst 08/01/2015   Thumb pain 07/29/2015   Acute diverticulitis 07/01/2015   Adnexal mass 07/01/2015   Underweight 11/10/2014   Muscle cramps at night 11/10/2014   Complaints of leg weakness 11/10/2014   Medicare annual wellness visit, subsequent 04/18/2014   Prolapse of female pelvic organs 07/11/2013   Screening for breast cancer 10/29/2012   Tubular adenoma of colon  10/29/2012   Familial multiple lipoprotein-type hyperlipidemia    GERD (gastroesophageal reflux disease)    S/P colonoscopy    Barrett esophagus     Oletta Cohn, OTR/L,CLT 09/29/2022, 10:45 AM  [facility] [address], [place], [address] Phone: [phone]   Fax:  [phone]  Name: Karen Parsons MRN: [id number] Date of Birth: [birth year]

## 2022-10-02 ENCOUNTER — Ambulatory Visit: Payer: Medicare Other | Admitting: Occupational Therapy

## 2022-10-06 ENCOUNTER — Ambulatory Visit: Payer: Medicare Other | Admitting: Occupational Therapy

## 2022-10-06 DIAGNOSIS — M6281 Muscle weakness (generalized): Secondary | ICD-10-CM

## 2022-10-06 DIAGNOSIS — M25642 Stiffness of left hand, not elsewhere classified: Secondary | ICD-10-CM

## 2022-10-06 DIAGNOSIS — M79642 Pain in left hand: Secondary | ICD-10-CM

## 2022-10-06 NOTE — Therapy (Signed)
[facility] [address] [place], [place], [address] Phone: [phone]   Fax:  [phone]  Occupational Therapy Treatment  Patient Details  Name: Karen Parsons MRN: [id number] Date of Birth: [birth year] Referring Provider (OT): [place]   Encounter Date: 10/06/2022   OT End of Session - 10/06/22 1735     Visit Number 5    Number of Visits 12    Date for OT Re-Evaluation 11/03/22    OT Start Time 1535    OT Stop Time 1615    OT Time Calculation (min) 40 min    Activity Tolerance Patient tolerated treatment well    Behavior During Therapy [facility] for tasks assessed/performed             Past Medical History:  Diagnosis Date   Barrett esophagus    2018.  dr. Enrigue Catena said this has resolved after a recent endoscopy   Barrett's esophagus 12/2008   [place],    Benign neoplasm of colon    Bilateral renal cysts    Colon polyps    Cramp of limb    Diverticulosis    Generalized osteoarthritis    Genital prolapse    GERD (gastroesophageal reflux disease)    H/O seasonal allergies    Hallux valgus    Hemorrhoids    History of hiatal hernia    Hyperlipidemia    intolerant of statins, normal diagnostic cath July 2010 Gwen Pounds)   Left ovarian cyst    Osteoporosis    by DEXA 2008   S/P colonoscopy 2011   [name], polyp removed   Stroke 11/2015   TIA. no deficits    Past Surgical History:  Procedure Laterality Date   ABDOMINAL HYSTERECTOMY     age 55, menorrhagia   APPENDECTOMY     arthroscopy of knee Right    CARDIAC CATHETERIZATION Left 12/2008   [name]:  normal   CARPOMETACARPAL (CMC) FUSION OF THUMB Left 08/18/2022   Procedure: Left thumb carpometacarpal arthroplasty;  Surgeon: Kennedy Bucker, MD;  Location: [facility];  Service: Orthopedics;  Laterality: Left;   CATARACT EXTRACTION Left 2010   [name]   COLONOSCOPY WITH PROPOFOL N/A 01/07/2015   Procedure: COLONOSCOPY WITH PROPOFOL;  Surgeon:  Scot Jun, MD;  Location: [facility] ENDOSCOPY;  Service: Endoscopy;  Laterality: N/A;   ESOPHAGOGASTRODUODENOSCOPY N/A 01/07/2015   Procedure: ESOPHAGOGASTRODUODENOSCOPY (EGD);  Surgeon: Scot Jun, MD;  Location: [facility] ENDOSCOPY;  Service: Endoscopy;  Laterality: N/A;   ESOPHAGOGASTRODUODENOSCOPY (EGD) WITH PROPOFOL N/A 05/23/2015   Procedure: ESOPHAGOGASTRODUODENOSCOPY (EGD) WITH PROPOFOL;  Surgeon: Scot Jun, MD;  Location: [facility] ENDOSCOPY;  Service: Endoscopy;  Laterality: N/A;   ESOPHAGOGASTRODUODENOSCOPY (EGD) WITH PROPOFOL N/A 07/11/2015   Procedure: ESOPHAGOGASTRODUODENOSCOPY (EGD) WITH PROPOFOL with Barrx;  Surgeon: Wallace Cullens, MD;  Location: [facility] ENDOSCOPY;  Service: Gastroenterology;  Laterality: N/A;   ESOPHAGOGASTRODUODENOSCOPY (EGD) WITH PROPOFOL N/A 09/12/2015   Procedure: ESOPHAGOGASTRODUODENOSCOPY (EGD) WITH PROPOFOL;  Surgeon: Wallace Cullens, MD;  Location: [facility] ENDOSCOPY;  Service: Gastroenterology;  Laterality: N/A;   ESOPHAGOGASTRODUODENOSCOPY (EGD) WITH PROPOFOL N/A 11/14/2015   Procedure: ESOPHAGOGASTRODUODENOSCOPY (EGD) WITH PROPOFOL;  Surgeon: Wallace Cullens, MD;  Location: [facility] ENDOSCOPY;  Service: Gastroenterology;  Laterality: N/A;   EYE SURGERY     HEMORRHOID SURGERY  10/02/2013   by simple ligation   JOINT REPLACEMENT Bilateral    knees   KNEE ARTHROPLASTY Right 05/10/2017   Procedure: COMPUTER ASSISTED TOTAL KNEE ARTHROPLASTY;  Surgeon: Donato Heinz, MD;  Location:  [facility] ORS;  Service: Orthopedics;  Laterality: Right;   KNEE ARTHROPLASTY Left 09/25/2019   Procedure: COMPUTER ASSISTED TOTAL KNEE ARTHROPLASTY;  Surgeon: Donato Heinz, MD;  Location: [facility] ORS;  Service: Orthopedics;  Laterality: Left;   medial meniscotomy Left 2007   Reita Chard   OSTEOTOMY TOES Right 2008   [name]   TONSILLECTOMY      There were no vitals filed for this visit.   Subjective Assessment - 10/06/22 1734     Subjective  I am using my hand I carried table and chair  nourished on the stairs that the day.  No pain- thumb feeling good.    Pertinent History Alliene Klugh is a 82 y.o. female who presents today status post left thumb CMC arthroplasty by Dr. Kennedy Bucker on 08/18/2022. Patient is doing well. Her pain is well-controlled. No numbness or tingling. No warmth redness or drainage. Swelling is significantly improved from previous visit.Left upper extremity shows CMC incision sites are intact with sutures. Sutures moved, Steri-Strips applied. She has little to no swelling throughout the hand or digits. She is moving the thumb and digits well with no signs of pain or discomfort. No warmth redness or drainage along the incision sites.  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 Continue with thumb spica brace during the day and nighttime for 1 more week. In 1 week she can transition to wearing the brace only at night for an additional 3 weeks. 2. Start hand therapy 3. Okay to begin showering allow Steri-Strips to come off on their own 4. Follow-up with Dr. 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 No/denies                [facility] OT Assessment - 10/06/22 0001       AROM   Left Wrist Extension 70 Degrees    Left Wrist Flexion 90 Degrees    Left Wrist Radial Deviation 20 Degrees    Left Wrist Ulnar Deviation 38 Degrees      Strength   Right Hand Grip (lbs) 40    Right Hand Lateral Pinch 10 lbs    Right Hand 3 Point Pinch 9 lbs    Left Hand Grip (lbs) 31    Left Hand Lateral Pinch 6 lbs    Left Hand 3 Point Pinch 7 lbs      Left Hand AROM   L Thumb Radial ADduction/ABduction 0-55 50    L Thumb Palmar ADduction/ABduction 0-45 50    L Thumb Opposition to Index --   opposition to base of 5th                      OT Treatments/Exercises (OP) - 10/06/22 0001       LUE Paraffin   Number Minutes Paraffin 8 Minutes    LUE Paraffin Location Hand;Wrist    Comments Prior to scar massage and soft tissue             Scar improving - not as adhere or tender AROM increase greatly and strength in wrist ext, flexion and RD, UD  Patient to do palmar and radial abduction of thumb 10 reps to 3 times  Cont  rubber band for PA and RA - 2 x 12 reps Review again with pt   Pain free - 2 x day  Cont opposition focusing on oval initiating IP flexion to second through 5th Opening thumb in between -10reps     16 oz hammer for forearm sup/pro  2 x 15 reps  2x day pain free Review with pt medium teal putty for gripping 15 reps pain-free 2 times a day p And Lat pinch - patient had a hard time with straight plane during lateral pinch.  12 reps.   Needed 75% v/c and t/c - with hand out use too  Pt to hold off on 3 point pinch  Had some pain        OT Education - 10/06/22 1735     Education Details changes and progression with HEP,  scar management.    Person(s) Educated Patient;Other (comment)    Methods Explanation;Demonstration;Tactile cues;Verbal cues;Handout    Comprehension Verbal cues required;Returned demonstration;Verbalized understanding              OT Short Term Goals - 09/08/22 1140       OT SHORT TERM GOAL #1   Title Pt to be ind in wearing of splint and HEP to keep pain under 2/10 with AROM improve to [facility] to initiate strengthening    Baseline Thumb spica off for few days and not sleeping- pain increase with thumb motion to 5th and collapsing/ADD into palm    Time 3    Period Weeks    Status New    Target Date 09/29/22               OT Long Term Goals - 09/08/22 1142       OT LONG TERM GOAL #1   Title Pt show increase thumb PA and RA WNL and strenght to pick up glass and turn doorknob without increase pain    Baseline 3 wks s/p - thumb  PA 50 , and RA 44 degrees- cannot turn doorknob or hold glass    Time 6    Period Weeks    Status New    Target Date 10/20/22      OT LONG TERM GOAL #2   Title Wrist strenght 5/5 for pt to push and pull heavy door without increase symptoms    Baseline 3 wks s/p - pain with RD, UD - AROM WNL - strength decrease    Time 8    Period Weeks    Status New    Target Date 11/03/22      OT LONG TERM GOAL #3   Title L grip and prehension strength increase to 75% compare to R to pull up pants, carry plate, push w/c and do laundery without increase symptoms    Baseline 3 wks s/p - splint still on - not using hand in anything resistance    Time 8    Period Weeks    Status New    Target Date 11/03/22                   Plan - 10/06/22 1735     Clinical Impression Statement Patient   Left [facility] arthroplasty on 08/18/2022 by Dr. Rosita Kea. Pt now 7 wks s/p and not wearing any splints.   L thumb AROM and wrist WNL - and pt report  no pain with use but report picking up and carry table and chairs. Patient with memory issues  - had neigbour in past  with her to  remind her about her exercises and instructions from therapy.  Reviewed again with pt strenghtening  for thumb PA and RA using rubber band 2-3 x 12 reps and  16oz hammer for sup/pro 2x 12 reps. Scar improved greatly - reviewed with pt again putty for gripping and lat pinch - need 75% cueing and t/c - hold off on 3 point pinch -had pain.   Pt to cont with  scar management.  Patient limited in functional use of left hand because of increased scar tissue, pain and and stiffness as well as decrease strength  Patient can benefit from skilled OT services to maximize safety and independence in necessary daily tasks.    OT Occupational Profile and History Problem Focused Assessment - Including review of records relating to presenting problem    Occupational performance deficits (Please refer to evaluation for details): ADL's;IADL's;Play;Leisure    Body  Structure / Function / Physical Skills ADL;IADL;Strength;Pain;Edema;Scar mobility;FMC;Dexterity;Decreased knowledge of precautions;ROM;UE functional use    Rehab Potential Good    Clinical Decision Making Several treatment options, min-mod task modification necessary    Comorbidities Affecting Occupational Performance: May have comorbidities impacting occupational performance    Modification or Assistance to Complete Evaluation  Min-Moderate modification of tasks or assist with assess necessary to complete eval    OT Frequency 2x / week    OT Duration 8 weeks    OT Treatment/Interventions Self-care/ADL training;Contrast Bath;DME and/or AE instruction;Manual Therapy;Passive range of motion;Scar mobilization;Fluidtherapy;Paraffin;Therapeutic exercise;Splinting;Patient/family education    Consulted and Agree with Plan of Care Patient             Patient will benefit from skilled therapeutic intervention in order to improve the following deficits and impairments:   Body Structure / Function / Physical Skills: ADL, IADL, Strength, Pain, Edema, Scar mobility, FMC, Dexterity, Decreased knowledge of precautions, ROM, UE functional use       Visit Diagnosis: Pain in left hand  Stiffness of left hand, not elsewhere classified  Muscle weakness (generalized)    Problem List Patient Active Problem List   Diagnosis Date Noted   History of colonic polyps 11/26/2021   Primary osteoarthritis of first carpometacarpal joint of left hand 09/23/2021   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   B12 deficiency 05/24/2019   Primary osteoarthritis of left knee 04/16/2019   Pseudophakia of right eye 07/26/2017   S/P total knee arthroplasty 05/10/2017   Vitamin D deficiency, unspecified 12/10/2016   CVA (cerebral infarction) 12/18/2015   Acute renal insufficiency 12/18/2015   Hyperglycemia 12/18/2015   Tremor 12/18/2015   Transient cerebral ischemic  attack 12/18/2015   Aortic atherosclerosis 08/01/2015   Bilateral renal cysts 08/01/2015   Generalized osteoarthritis 08/01/2015   Left ovarian cyst 08/01/2015   Thumb pain 07/29/2015   Acute diverticulitis 07/01/2015   Adnexal mass 07/01/2015   Underweight 11/10/2014   Muscle cramps at night 11/10/2014   Complaints of leg weakness 11/10/2014   Medicare annual wellness visit, subsequent 04/18/2014   Prolapse of female pelvic organs 07/11/2013   Screening for breast cancer 10/29/2012   Tubular adenoma of colon 10/29/2012   Familial multiple lipoprotein-type hyperlipidemia    GERD (gastroesophageal reflux disease)    S/P colonoscopy    Barrett esophagus     Oletta Cohn, OTR/L,CLT 10/06/2022, 5:40 PM  [facility] [address] [place], [address] Phone: [phone]   Fax:  [phone]  Name: Ky  Provencio MRN: [id number] Date of Birth: March 19, 1941

## 2022-10-13 ENCOUNTER — Ambulatory Visit: Payer: Medicare Other | Admitting: Occupational Therapy

## 2022-10-13 DIAGNOSIS — M25642 Stiffness of left hand, not elsewhere classified: Secondary | ICD-10-CM

## 2022-10-13 DIAGNOSIS — M79642 Pain in left hand: Secondary | ICD-10-CM

## 2022-10-13 DIAGNOSIS — M6281 Muscle weakness (generalized): Secondary | ICD-10-CM

## 2022-10-13 NOTE — Therapy (Signed)
[facility] [address] [place], [place], [address] Phone: [phone]   Fax:  [phone]  Occupational Therapy Treatment  Patient Details  Name: Karen Parsons MRN: [id number] Date of Birth: 10/16/1940 Referring Provider (OT): Karen Parsons   Encounter Date: 10/13/2022   OT End of Session - 10/13/22 1318     Visit Number 6    Number of Visits 12    Date for OT Re-Evaluation 11/03/22    OT Start Time 1317    OT Stop Time 1355    OT Time Calculation (min) 38 min    Activity Tolerance Patient tolerated treatment well    Behavior During Therapy [facility] for tasks assessed/performed             Past Medical History:  Diagnosis Date   Barrett esophagus    2018.  dr. Enrigue Parsons said this has resolved after a recent endoscopy   Barrett's esophagus 12/2008   Karen Parsons,    Karen Parsons    Bilateral renal cysts    Parsons polyps    Cramp of limb    Diverticulosis    Generalized osteoarthritis    Genital prolapse    GERD (gastroesophageal reflux disease)    H/O seasonal allergies    Hallux valgus    Hemorrhoids    History of hiatal hernia    Hyperlipidemia    intolerant of statins, normal diagnostic cath July 2010 Karen Parsons)   Left ovarian cyst    Osteoporosis    by DEXA 2008   S/P colonoscopy 2011   Karen Parsons, polyp removed   Stroke 11/2015   TIA. no deficits    Past Surgical History:  Procedure Laterality Date   ABDOMINAL HYSTERECTOMY     age 1, menorrhagia   APPENDECTOMY     arthroscopy of knee Right    CARDIAC CATHETERIZATION Left 12/2008   Karen Parsons:  normal   CARPOMETACARPAL (CMC) FUSION OF THUMB Left 08/18/2022   Procedure: Left thumb carpometacarpal arthroplasty;  Surgeon: Karen Bucker, MD;  [facility];  Service: Orthopedics;  Laterality: Left;   CATARACT EXTRACTION Left 2010   Karen [facility]ons   COLONOSCOPY WITH PROPOFOL N/A 01/07/2015   Procedure: COLONOSCOPY WITH PROPOFOL;  Surgeon:  Karen Jun, MD;  Location: [facility] ENDOSCOPY;  Service: Endoscopy;  Laterality: N/A;   ESOPHAGOGASTRODUODENOSCOPY N/A 01/07/2015   Procedure: ESOPHAGOGASTRODUODENOSCOPY (EGD);  Surgeon: Karen Jun, MD;  Location: [facility] ENDOSCOPY;  Service: Endoscopy;  Laterality: N/A;   ESOPHAGOGASTRODUODENOSCOPY (EGD) WITH PROPOFOL N/A 05/23/2015   Procedure: ESOPHAGOGASTRODUODENOSCOPY (EGD) WITH PROPOFOL;  Surgeon: Karen Jun, MD;  [facility];  Service: Endoscopy;  Laterality: N/A;   ESOPHAGOGASTRODUODENOSCOPY (EGD) WITH PROPOFOL N/A 07/11/2015   Procedure: ESOPHAGOGASTRODUODENOSCOPY (EGD) WITH PROPOFOL with Barrx;  Surgeon: Karen Cullens, MD;  Location: [facility] ENDOSCOPY;  Service: Gastroenterology;  Laterality: N/A;   ESOPHAGOGASTRODUODENOSCOPY (EGD) WITH PROPOFOL N/A 09/12/2015   Procedure: ESOPHAGOGASTRODUODENOSCOPY (EGD) WITH PROPOFOL;  Surgeon: Karen Cullens, MD;  Location: [facility] ENDOSCOPY;  Service: Gastroenterology;  Laterality: N/A;   ESOPHAGOGASTRODUODENOSCOPY (EGD) WITH PROPOFOL N/A 11/14/2015   Procedure: ESOPHAGOGASTRODUODENOSCOPY (EGD) WITH PROPOFOL;  Surgeon: Karen Cullens, MD;  Location: [facility] ENDOSCOPY;  Service: Gastroenterology;  Laterality: N/A;   EYE SURGERY     HEMORRHOID SURGERY  10/02/2013   by simple ligation   JOINT REPLACEMENT Bilateral    knees   KNEE ARTHROPLASTY Right 05/10/2017   Procedure: COMPUTER ASSISTED TOTAL KNEE ARTHROPLASTY;  Surgeon: Karen Heinz, MD;  Location:  [facility];  Service: Orthopedics;  Laterality: Right;   KNEE ARTHROPLASTY Left 09/25/2019   Procedure: COMPUTER ASSISTED TOTAL KNEE ARTHROPLASTY;  Surgeon: Karen Heinz, MD;  Location: [facility];  Service: Orthopedics;  Laterality: Left;   medial meniscotomy Left 2007   Karen [facility]ons   OSTEOTOMY TOES Right 2008   Karen Parsons   TONSILLECTOMY      There were no vitals filed for this visit.   Subjective Assessment - 10/13/22 1318     Subjective  Doing okay.  I think cutting branches in the yard..   4th Corner of the fitting sheet his a struggle.    Pertinent History Karen Parsons is a 82 y.o. female who presents today status post left thumb CMC arthroplasty by Dr. Kennedy Parsons on 08/18/2022. Patient is doing well. Her pain is well-controlled. No numbness or tingling. No warmth redness or drainage. Swelling is significantly improved from previous visit.Left upper extremity shows CMC incision sites are intact with sutures. Sutures moved, Steri-Strips applied. She has little to no swelling throughout the hand or digits. She is moving the thumb and digits well with no signs of pain or discomfort. No warmth redness or drainage along the incision sites.  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 Continue with thumb spica brace during the day and nighttime for 1 more week. In 1 week she can transition to wearing the brace only at night for an additional 3 weeks. 2. Start hand therapy 3. Okay to begin showering allow Steri-Strips to come off on their own 4. Follow-up with Dr. 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 No/denies                Karen [facility]ons OT Assessment - 10/13/22 0001       Strength   Left Hand Grip (lbs) 32    Left Hand Lateral Pinch 6 lbs              Assessed patient this date using left hand and pushing and pulling heavy door as well as pushing up from chair.  Carrying 8 Parsons pain-free.  Scar improving - not as adhere or tender AROM increase greatly and strength in wrist ext, flexion and RD, UD  Patient to do palmar and radial abduction of thumb 10 reps to 3 times  Cont rubber band for PA and RA - 2 x 12 reps Review again with pt   Pain free - 2 x day  Cont opposition focusing on oval initiating IP flexion  to second through 5th Opening thumb in between -10reps     medium teal putty for gripping 15 reps pain-free 2 times a day p Patient pain with lateral and 3 point pinch.-Patient not keeping alignment of thumb. Patient started with a CMC neoprene splint to use for prevention strength 12 reps. Needed 75% verbal and tactile cueing but could do it pain-free today. Reviewed with patient a few times donning and doffing CMC neoprene. Patient to do an increase to a second set of pain-free over the next week and follow-up.-              OT Education - 10/13/22 1318     Education Details changes and progression  with HEP,  scar management.    Person(s) Educated Patient;Other (comment)    Methods Explanation;Demonstration;Tactile cues;Verbal cues;Handout    Comprehension Verbal cues required;Returned demonstration;Verbalized understanding              OT Short Term Goals - 09/08/22 1140       OT SHORT TERM GOAL #1   Title Pt to be ind in wearing of splint and HEP to keep pain under 2/10 with AROM improve to [facility] to initiate strengthening    Baseline Thumb spica off for few days and not sleeping- pain increase with thumb motion to 5th and collapsing/ADD into palm    Time 3    Period Weeks    Status New    Target Date 09/29/22               OT Long Term Goals - 09/08/22 1142       OT LONG TERM GOAL #1   Title Pt show increase thumb PA and RA WNL and strenght to pick up glass and turn doorknob without increase pain    Baseline 3 wks s/p - thumb PA 50 , and RA 44 degrees- cannot turn doorknob or hold glass    Time 6    Period Weeks    Status New    Target Date 10/20/22      OT LONG TERM GOAL #2   Title Wrist strenght 5/5 for pt to push and pull heavy door without increase symptoms    Baseline 3 wks s/p - pain with RD, UD - AROM WNL - strength decrease    Time 8    Period Weeks    Status New    Target Date 11/03/22      OT LONG TERM GOAL #3   Title L grip and  prehension strength increase to 75% compare to R to pull up pants, carry plate, push w/c and do laundery without increase symptoms    Baseline 3 wks s/p - splint still on - not using hand in anything resistance    Time 8    Period Weeks    Status New    Target Date 11/03/22                   Plan - 10/13/22 1318     Clinical Impression Statement Patient   Left CMC arthroplasty on 08/18/2022 by Dr. Rosita Parsons. Pt now 8  wks s/p .   L thumb AROM and wrist WNL - and pt report  no pain with use but report picking up and carry table and chairs and cutting branches in yard. Patient with memory issues  - had neigbour in  with her to  remind her about her exercises and instructions from therapy.  Reviewed again with pt strenghtening  for thumb PA and RA using rubber band 2-3 x 12 reps.  Scar improved greatly - reviewed with pt again putty for gripping and  adn pinches - had increase pain with pinches - fitted with CMC neoprene splint to use for prehension pain free. Review and pt demo understanding .  Pt was able to push and pull heavy door open , turn doorknot and carry 8 lbs pain free. Patient limited in functional use of left hand  pain and decrease strength  Patient can benefit from skilled OT services to maximize safety and independence in necessary daily tasks.    OT Occupational Profile and History Problem Focused Assessment - Including review of records relating to  presenting problem    Occupational performance deficits (Please refer to evaluation for details): ADL's;IADL's;Play;Leisure    Body Structure / Function / Physical Skills ADL;IADL;Strength;Pain;Edema;Scar mobility;FMC;Dexterity;Decreased knowledge of precautions;ROM;UE functional use    Rehab Potential Good    Clinical Decision Making Several treatment options, min-mod task modification necessary    Comorbidities Affecting Occupational Performance: May have comorbidities impacting occupational performance    Modification or Assistance  to Complete Evaluation  Min-Moderate modification of tasks or assist with assess necessary to complete eval    OT Frequency 2x / week    OT Treatment/Interventions Self-care/ADL training;Contrast Bath;DME and/or AE instruction;Manual Therapy;Passive range of motion;Scar mobilization;Fluidtherapy;Paraffin;Therapeutic exercise;Splinting;Patient/family education    Consulted and Agree with Plan of Care Patient             Patient will benefit from skilled therapeutic intervention in order to improve the following deficits and impairments:   Body Structure / Function / Physical Skills: ADL, IADL, Strength, Pain, Edema, Scar mobility, FMC, Dexterity, Decreased knowledge of precautions, ROM, UE functional use       Visit Diagnosis: Pain in left hand  Stiffness of left hand, not elsewhere classified  Muscle weakness (generalized)    Problem List Patient Active Problem List   Diagnosis Date Noted   History of colonic polyps 11/26/2021   Primary osteoarthritis of first carpometacarpal joint of left hand 09/23/2021   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   B12 deficiency 05/24/2019   Primary osteoarthritis of left knee 04/16/2019   Pseudophakia of right eye 07/26/2017   S/P total knee arthroplasty 05/10/2017   Vitamin D deficiency, unspecified 12/10/2016   CVA (cerebral infarction) 12/18/2015   Acute renal insufficiency 12/18/2015   Hyperglycemia 12/18/2015   Tremor 12/18/2015   Transient cerebral ischemic attack 12/18/2015   Aortic atherosclerosis 08/01/2015   Bilateral renal cysts 08/01/2015   Generalized osteoarthritis 08/01/2015   Left ovarian cyst 08/01/2015   Thumb pain 07/29/2015   Acute diverticulitis 07/01/2015   Adnexal mass 07/01/2015   Underweight 11/10/2014   Muscle cramps at night 11/10/2014   Complaints of leg weakness 11/10/2014   Medicare annual wellness visit, subsequent 04/18/2014   Prolapse of female  pelvic organs 07/11/2013   Screening for breast cancer 10/29/2012   Tubular adenoma of Parsons 10/29/2012   Familial multiple lipoprotein-type hyperlipidemia    GERD (gastroesophageal reflux disease)    S/P colonoscopy    Barrett esophagus     Oletta Cohn, OTR/L,CLT 10/13/2022, 2:03 PM  [facility] [address] [place], [address] Phone: [phone]   Fax:  [phone]  Name: Karen Parsons MRN: [id number] Date of Birth: 06-24-1940

## 2022-10-20 ENCOUNTER — Encounter: Payer: Self-pay | Admitting: Occupational Therapy

## 2022-10-20 ENCOUNTER — Ambulatory Visit: Payer: Medicare Other | Admitting: Occupational Therapy

## 2022-10-20 ENCOUNTER — Encounter: Payer: Medicare Other | Admitting: Occupational Therapy

## 2022-10-20 DIAGNOSIS — M79642 Pain in left hand: Secondary | ICD-10-CM

## 2022-10-20 DIAGNOSIS — M25642 Stiffness of left hand, not elsewhere classified: Secondary | ICD-10-CM | POA: Diagnosis not present

## 2022-10-20 DIAGNOSIS — M6281 Muscle weakness (generalized): Secondary | ICD-10-CM

## 2022-10-20 NOTE — Therapy (Signed)
[facility] [address] [place], [place], [address] Phone: [phone]   Fax:  [phone]  Occupational Therapy Treatment  Patient Details  Name: Karen Parsons MRN: [id number] Date of Birth: 06-Nov-1940 Referring Provider (OT): [place]   Encounter Date: 10/20/2022   OT End of Session - 10/20/22 1252     Visit Number 7    Number of Visits 12    Date for OT Re-Evaluation 11/03/22    OT Start Time 1130    OT Stop Time 1201    OT Time Calculation (min) 31 min    Activity Tolerance Patient tolerated treatment well    Behavior During Therapy [facility] for tasks assessed/performed             Past Medical History:  Diagnosis Date   Barrett esophagus    2018.  dr. Enrigue Parsons said this has resolved after a recent endoscopy   Barrett's esophagus 12/2008   Karen Parsons,    Benign neoplasm of colon    Bilateral renal cysts    Colon polyps    Cramp of limb    Diverticulosis    Generalized osteoarthritis    Genital prolapse    GERD (gastroesophageal reflux disease)    H/O seasonal allergies    Hallux valgus    Hemorrhoids    History of hiatal hernia    Hyperlipidemia    intolerant of statins, normal diagnostic cath July 2010 Karen Parsons)   Left ovarian cyst    Osteoporosis    by DEXA 2008   S/P colonoscopy 2011   Karen Parsons, polyp removed   Stroke (HCC) 11/2015   TIA. no deficits    Past Surgical History:  Procedure Laterality Date   ABDOMINAL HYSTERECTOMY     age 57, menorrhagia   APPENDECTOMY     arthroscopy of knee Right    CARDIAC CATHETERIZATION Left 12/2008   Karen Parsons:  normal   CARPOMETACARPAL (CMC) FUSION OF THUMB Left 08/18/2022   Procedure: Left thumb carpometacarpal arthroplasty;  Surgeon: Karen Bucker, MD;  Location: [facility];  Service: Orthopedics;  Laterality: Left;   CATARACT EXTRACTION Left 2010   Karen Parsons   COLONOSCOPY WITH PROPOFOL N/A 01/07/2015   Procedure: COLONOSCOPY WITH PROPOFOL;   Surgeon: Karen Jun, MD;  Location: [facility] ENDOSCOPY;  Service: Endoscopy;  Laterality: N/A;   ESOPHAGOGASTRODUODENOSCOPY N/A 01/07/2015   Procedure: ESOPHAGOGASTRODUODENOSCOPY (EGD);  Surgeon: Karen Jun, MD;  Location: [facility] ENDOSCOPY;  Service: Endoscopy;  Laterality: N/A;   ESOPHAGOGASTRODUODENOSCOPY (EGD) WITH PROPOFOL N/A 05/23/2015   Procedure: ESOPHAGOGASTRODUODENOSCOPY (EGD) WITH PROPOFOL;  Surgeon: Karen Jun, MD;  Location: [facility] ENDOSCOPY;  Service: Endoscopy;  Laterality: N/A;   ESOPHAGOGASTRODUODENOSCOPY (EGD) WITH PROPOFOL N/A 07/11/2015   Procedure: ESOPHAGOGASTRODUODENOSCOPY (EGD) WITH PROPOFOL with Barrx;  Surgeon: Karen Cullens, MD;  Location: [facility] ENDOSCOPY;  Service: Gastroenterology;  Laterality: N/A;   ESOPHAGOGASTRODUODENOSCOPY (EGD) WITH PROPOFOL N/A 09/12/2015   Procedure: ESOPHAGOGASTRODUODENOSCOPY (EGD) WITH PROPOFOL;  Surgeon: Karen Cullens, MD;  Location: [facility] ENDOSCOPY;  Service: Gastroenterology;  Laterality: N/A;   ESOPHAGOGASTRODUODENOSCOPY (EGD) WITH PROPOFOL N/A 11/14/2015   Procedure: ESOPHAGOGASTRODUODENOSCOPY (EGD) WITH PROPOFOL;  Surgeon: Karen Cullens, MD;  Location: [facility] ENDOSCOPY;  Service: Gastroenterology;  Laterality: N/A;   EYE SURGERY     HEMORRHOID SURGERY  10/02/2013   by simple ligation   JOINT REPLACEMENT Bilateral    knees   KNEE ARTHROPLASTY Right 05/10/2017   Procedure: COMPUTER ASSISTED TOTAL KNEE ARTHROPLASTY;  Surgeon: Karen Heinz, MD;  Location: [facility] ORS;  Service: Orthopedics;  Laterality: Right;   KNEE ARTHROPLASTY Left 09/25/2019   Procedure: COMPUTER ASSISTED TOTAL KNEE ARTHROPLASTY;  Surgeon: Karen Heinz, MD;  Location: [facility] ORS;  Service: Orthopedics;  Laterality: Left;   medial meniscotomy Left 2007   Karen Parsons   OSTEOTOMY TOES Right 2008   Karen Parsons   TONSILLECTOMY      There were no vitals filed for this visit.   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 female who presents today status post left thumb CMC arthroplasty by Dr. Kennedy Parsons on 08/18/2022. Patient is doing well. Her pain is well-controlled. No numbness or tingling. No warmth redness or drainage. Swelling is significantly improved from previous visit.Left upper extremity shows CMC incision sites are intact with sutures. Sutures moved, Steri-Strips applied. She has little to no swelling throughout the hand or digits. She is moving the thumb and digits well with no signs of pain or discomfort. No warmth redness or drainage along the incision sites.  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 Continue with thumb spica brace during the day and nighttime for 1 more week. In 1 week she can transition to wearing the brace only at night for an additional 3 weeks. 2. Start hand therapy 3. Okay to begin showering allow Steri-Strips to come off on their own 4. Follow-up with Dr. 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 No/denies                [facility] OT Assessment - 10/20/22 0001       Strength   Left Hand Grip (lbs) 32    Left Hand Lateral Pinch 6 lbs    Left Hand 3 Point Pinch 7 lbs               Using left hand -pushing and pulling heavy door as well as pushing up from chair no issues   Carrying  and lift 8 Parsons pain-free. 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 Recommend for pt to hold off pushing chairs to about 6 months out from surgery Pt wants to follow up in month                OT Education - 10/20/22 1252     Education Details progress and recommendations    Person(s) Educated Patient;Other (comment)  Methods Explanation;Demonstration;Tactile cues;Verbal cues;Handout    Comprehension Verbal cues required;Returned demonstration;Verbalized understanding              OT Short Term Goals - 10/20/22 1256       OT SHORT TERM GOAL #1   Title Pt to be ind in wearing of splint and HEP to keep pain under 2/10 with AROM improve to [facility] to initiate strengthening    Status Achieved               OT Long Term Goals - 10/20/22 1256       OT LONG TERM GOAL #1   Title Pt show increase thumb PA and RA WNL and strenght to pick up glass and turn doorknob without increase pain    Status Achieved      OT LONG TERM GOAL #2   Title Wrist strenght 5/5 for pt to push and pull heavy door without increase symptoms    Status Achieved      OT LONG TERM GOAL #3   Title L grip and prehension strength increase to 75% compare to R to pull up pants, carry plate, push w/c and do laundery without increase symptoms    Baseline 3 wks s/p - splint still on - not using hand in anything resistance NOW grip and prehension improving - observe pt habit of holding thumb in fist , or with lat pinch around digits, hold object in hand with lubrical fist - CMC in ADD into palm    Time 3    Period Weeks    Status On-going    Target Date 11/03/22                   Plan - 10/20/22 1253     Clinical Impression Statement Patient   Left  CMC arthroplasty on 08/18/2022 by Dr. Rosita Parsons. Pt now 9  wks s/p .   L thumb AROM and wrist WNL - and pt report  no pain with use - report picking up and carry table and chairs and cutting branches in yard and pulling weed. Patient with some memory issues  - had neigbour in  with her to  remind her about her exercises and instructions from therapy. Pt had hard time doing prehension strenghtening HEP - fitted with CMC neoprene splint  last visit to use to increase  prehension pain free.Strength in thumb and wrist WFL.  Pt was able to push and pull heavy door open , turn doorknot and carry 8 lbs pain free. Pt has follow up with surgeon in 2 wks and ask to follow up with OT one more time in about month - making sure she is doing okay.    OT Occupational Profile and History Problem Focused Assessment - Including review of records relating to presenting problem    Occupational performance deficits (Please refer to evaluation for details): ADL's;IADL's;Play;Leisure    Body Structure / Function / Physical Skills ADL;IADL;Strength;Pain;Edema;Scar mobility;FMC;Dexterity;Decreased knowledge of precautions;ROM;UE functional use    Rehab Potential Good    Clinical Decision Making Several treatment options, min-mod task modification necessary    Comorbidities Affecting Occupational Performance: May have comorbidities impacting occupational performance    Modification or Assistance to Complete Evaluation  Min-Moderate modification of tasks or assist with assess necessary to complete eval    OT Frequency Monthly    OT Duration 4 weeks    OT Treatment/Interventions Self-care/ADL training;Contrast Bath;DME and/or AE instruction;Manual Therapy;Passive range of motion;Scar mobilization;Fluidtherapy;Paraffin;Therapeutic exercise;Splinting;Patient/family education  Consulted and Agree with Plan of Care Patient             Patient will benefit from skilled therapeutic intervention in order to improve the following  deficits and impairments:   Body Structure / Function / Physical Skills: ADL, IADL, Strength, Pain, Edema, Scar mobility, FMC, Dexterity, Decreased knowledge of precautions, ROM, UE functional use       Visit Diagnosis: Pain in left hand  Stiffness of left hand, not elsewhere classified  Muscle weakness (generalized)    Problem List Patient Active Problem List   Diagnosis Date Noted   History of colonic polyps 11/26/2021   Primary osteoarthritis of first carpometacarpal joint of left hand 09/23/2021   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   B12 deficiency 05/24/2019   Primary osteoarthritis of left knee 04/16/2019   Pseudophakia of right eye 07/26/2017   S/P total knee arthroplasty 05/10/2017   Vitamin D deficiency, unspecified 12/10/2016   CVA (cerebral infarction) 12/18/2015   Acute renal insufficiency 12/18/2015   Hyperglycemia 12/18/2015   Tremor 12/18/2015   Transient cerebral ischemic attack 12/18/2015   Aortic atherosclerosis (HCC) 08/01/2015   Bilateral renal cysts 08/01/2015   Generalized osteoarthritis 08/01/2015   Left ovarian cyst 08/01/2015   Thumb pain 07/29/2015   Acute diverticulitis 07/01/2015   Adnexal mass 07/01/2015   Underweight 11/10/2014   Muscle cramps at night 11/10/2014   Complaints of leg weakness 11/10/2014   Medicare annual wellness visit, subsequent 04/18/2014   Prolapse of female pelvic organs 07/11/2013   Screening for breast cancer 10/29/2012   Tubular adenoma of colon 10/29/2012   Familial multiple lipoprotein-type hyperlipidemia    GERD (gastroesophageal reflux disease)    S/P colonoscopy    Barrett esophagus     Oletta Cohn, OTR/L,CLT 10/20/2022, 12:58 PM  [facility] [address], [place], [address] Phone: [phone]   Fax:  [phone]  Name: Karen Parsons MRN: [id number] Date of Birth: 07/30/1940

## 2022-11-17 ENCOUNTER — Ambulatory Visit: Payer: Medicare Other | Attending: Orthopedic Surgery | Admitting: Occupational Therapy

## 2022-11-17 DIAGNOSIS — M79642 Pain in left hand: Secondary | ICD-10-CM | POA: Insufficient documentation

## 2022-11-17 DIAGNOSIS — M25642 Stiffness of left hand, not elsewhere classified: Secondary | ICD-10-CM | POA: Diagnosis present

## 2022-11-17 DIAGNOSIS — M6281 Muscle weakness (generalized): Secondary | ICD-10-CM | POA: Insufficient documentation

## 2022-11-17 NOTE — Therapy (Signed)
[facility] [address] [place], [place], [address] Phone: [phone]   Fax:  [phone]  Occupational Therapy Treatment/discharge  Patient Details  Name: Karen Parsons MRN: [id number] Date of Birth: [birth year] Referring Provider (OT): [place]   Encounter Date: 11/17/2022   OT End of Session - 11/17/22 1142     Visit Number 8    Number of Visits 8    Date for OT Re-Evaluation 11/17/22    OT Start Time 1115    OT Stop Time 1140    OT Time Calculation (min) 25 min    Activity Tolerance Patient tolerated treatment well    Behavior During Therapy [facility] for tasks assessed/performed             Past Medical History:  Diagnosis Date   Barrett esophagus    2018.  dr. Enrigue Catena said this has resolved after a recent endoscopy   Barrett's esophagus 12/2008   Wohl,    Benign neoplasm of colon    Bilateral renal cysts    Colon polyps    Cramp of limb    Diverticulosis    Generalized osteoarthritis    Genital prolapse    GERD (gastroesophageal reflux disease)    H/O seasonal allergies    Hallux valgus    Hemorrhoids    History of hiatal hernia    Hyperlipidemia    intolerant of statins, normal diagnostic cath July 2010 Gwen Pounds)   Left ovarian cyst    Osteoporosis    by DEXA 2008   S/P colonoscopy 2011   [name], polyp removed   Stroke (HCC) 11/2015   TIA. no deficits    Past Surgical History:  Procedure Laterality Date   ABDOMINAL HYSTERECTOMY     age 66, menorrhagia   APPENDECTOMY     arthroscopy of knee Right    CARDIAC CATHETERIZATION Left 12/2008   [name]:  normal   CARPOMETACARPAL (CMC) FUSION OF THUMB Left 08/18/2022   Procedure: Left thumb carpometacarpal arthroplasty;  Surgeon: Kennedy Bucker, MD;  Location: [facility]ERY [facility];  Service: Orthopedics;  Laterality: Left;   CATARACT EXTRACTION Left 2010   [name]   COLONOSCOPY WITH PROPOFOL N/A 01/07/2015   Procedure: COLONOSCOPY WITH PROPOFOL;   Surgeon: Scot Jun, MD;  Location: [facility] ENDOSCOPY;  Service: Endoscopy;  Laterality: N/A;   ESOPHAGOGASTRODUODENOSCOPY N/A 01/07/2015   Procedure: ESOPHAGOGASTRODUODENOSCOPY (EGD);  Surgeon: Scot Jun, MD;  [facility] ENDOSCOPY;  Service: Endoscopy;  Laterality: N/A;   ESOPHAGOGASTRODUODENOSCOPY (EGD) WITH PROPOFOL N/A 05/23/2015   Procedure: ESOPHAGOGASTRODUODENOSCOPY (EGD) WITH PROPOFOL;  Surgeon: Scot Jun, MD;  Location: [facility] ENDOSCOPY;  Service: Endoscopy;  Laterality: N/A;   ESOPHAGOGASTRODUODENOSCOPY (EGD) WITH PROPOFOL N/A 07/11/2015   Procedure: ESOPHAGOGASTRODUODENOSCOPY (EGD) WITH PROPOFOL with Barrx;  Surgeon: Wallace Cullens, MD;  Location: [facility] ENDOSCOPY;  Service: Gastroenterology;  Laterality: N/A;   ESOPHAGOGASTRODUODENOSCOPY (EGD) WITH PROPOFOL N/A 09/12/2015   Procedure: ESOPHAGOGASTRODUODENOSCOPY (EGD) WITH PROPOFOL;  Surgeon: Wallace Cullens, MD;  Location: [facility] ENDOSCOPY;  Service: Gastroenterology;  Laterality: N/A;   ESOPHAGOGASTRODUODENOSCOPY (EGD) WITH PROPOFOL N/A 11/14/2015   Procedure: ESOPHAGOGASTRODUODENOSCOPY (EGD) WITH PROPOFOL;  Surgeon: Wallace Cullens, MD;  Location: [facility] ENDOSCOPY;  Service: Gastroenterology;  Laterality: N/A;   EYE SURGERY     HEMORRHOID SURGERY  10/02/2013   by simple ligation   JOINT REPLACEMENT Bilateral    knees   KNEE ARTHROPLASTY Right 05/10/2017   Procedure: COMPUTER ASSISTED TOTAL KNEE ARTHROPLASTY;  Surgeon: Donato Heinz, MD;  Location: [facility] ORS;  Service: Orthopedics;  Laterality: Right;   KNEE ARTHROPLASTY Left 09/25/2019   Procedure: COMPUTER ASSISTED TOTAL KNEE ARTHROPLASTY;  Surgeon: Donato Heinz, MD;  Location: [facility] ORS;  Service: Orthopedics;  Laterality: Left;   medial meniscotomy Left 2007   Reita Chard   OSTEOTOMY TOES Right 2008   [name]   TONSILLECTOMY      There were no vitals filed for this visit.   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 female who presents today status post left thumb CMC arthroplasty by Dr. Kennedy Bucker on 08/18/2022. Patient is doing well. Her pain is well-controlled. No numbness or tingling. No warmth redness or drainage. Swelling is significantly improved from previous visit.Left upper extremity shows CMC incision sites are intact with sutures. Sutures moved, Steri-Strips applied. She has little to no swelling throughout the hand or digits. She is moving the thumb and digits well with no signs of pain or discomfort. No warmth redness or drainage along the incision sites.  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 Continue with thumb spica brace during the day and nighttime for 1 more week. In 1 week she can transition to wearing the brace only at night for an additional 3 weeks. 2. Start hand therapy 3. Okay to begin showering allow Steri-Strips to come off on their own 4. Follow-up with Dr. 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 No/denies                [facility] OT Assessment - 11/17/22 0001       AROM   Left Wrist Extension 70 Degrees    Left Wrist Flexion 90 Degrees    Left Wrist Radial Deviation 20 Degrees    Left Wrist Ulnar Deviation 38 Degrees      Strength   Right Hand Grip (lbs) 40    Right Hand Lateral Pinch 10 lbs    Right Hand 3 Point Pinch 9 lbs    Left Hand Grip (lbs) 36    Left Hand Lateral Pinch 6 lbs    Left Hand 3 Point Pinch 9 lbs      Left Hand AROM   L Thumb Radial ADduction/ABduction 0-55 50    L Thumb Palmar ADduction/ABduction 0-45 50    L Thumb Opposition to Index --   opposition to base of 5th             Patient made great progress in grip and prehension strength as well as active range of motion Patient pain-free with functional use at home and 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 Patient still with some weakness in palmar /radial abduction-recommend to use her CMC neoprene still with heavy activities for another 2 months Also add for her rubber band for palmar radial abduction to do 12 reps to 3 sets For another 2 to 4 weeks Patient met all goals                  OT Education -  11/17/22 1142     Education Details progress and recommendations    Person(s) Educated Patient;Other (comment)    Methods Explanation;Demonstration;Tactile cues;Verbal cues;Handout    Comprehension Verbal cues required;Returned demonstration;Verbalized understanding              OT Short Term Goals - 10/20/22 1256       OT SHORT TERM GOAL #1   Title Pt to be ind in wearing of splint and HEP to keep pain under 2/10 with AROM improve to [facility] to initiate strengthening    Status Achieved               OT Long Term Goals - 11/17/22 1146       OT LONG TERM GOAL #1   Title Pt show increase thumb PA and RA WNL and strenght to pick up glass and turn doorknob without increase pain    Status Achieved      OT LONG TERM GOAL #2   Title Wrist strenght 5/5 for pt to push and pull heavy door without increase symptoms    Status Achieved      OT LONG TERM GOAL #3   Title L grip and prehension strength increase to 75% compare to R to pull up pants, carry plate, push w/c and do laundery without increase symptoms    Status Achieved                   Plan - 11/17/22 1143     Clinical Impression Statement Patient   Left [facility] arthroplasty on 08/18/2022 by Dr. Rosita Kea. Pt now 3 months s/p .   L thumb AROM and wrist WNL - and pt report  no pain with use - report picking up and carry table and chairs and cutting branches in yard and pulling weed. Patient  with some memory issues  - had neigbour in  with her  at some of appt. Pt doing very well -AROM in L hand and wrist WNL - grip and prehension increase greatly and pain free- pt to cont to use for another 2 months CMC neoprene splint  for heavy activities- and for another month rubber band to PA and RA of thumb- 12 reps -3 sets. Pt met all goals and discharge at this time with HEP.    OT Occupational Profile and History Problem Focused Assessment - Including review of records relating to presenting problem    Occupational performance deficits (Please refer to evaluation for details): ADL's;IADL's;Play;Leisure    Body Structure / Function / Physical Skills ADL;IADL;Strength;Pain;Edema;Scar mobility;FMC;Dexterity;Decreased knowledge of precautions;ROM;UE functional use    Rehab Potential Good    Clinical Decision Making Several treatment options, min-mod task modification necessary    Comorbidities Affecting Occupational Performance: May have comorbidities impacting occupational performance    Modification or Assistance to Complete Evaluation  Min-Moderate modification of tasks or assist with assess necessary to complete eval    OT Treatment/Interventions Self-care/ADL training;Contrast Bath;DME and/or AE instruction;Manual Therapy;Passive range of motion;Scar mobilization;Fluidtherapy;Paraffin;Therapeutic exercise;Splinting;Patient/family education    Consulted and Agree with Plan of Care Patient             Patient will benefit from skilled therapeutic intervention in order to improve the following deficits and impairments:   Body Structure / Function / Physical Skills: ADL, IADL, Strength, Pain, Edema, Scar mobility, FMC, Dexterity, Decreased knowledge of precautions, ROM, UE functional use       Visit Diagnosis: Pain in left hand  Stiffness of left hand, not elsewhere classified  Muscle weakness (generalized)    Problem List Patient Active Problem List   Diagnosis Date Noted    History of colonic polyps 11/26/2021   Primary osteoarthritis of first carpometacarpal joint of left hand 09/23/2021   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   B12 deficiency 05/24/2019   Primary osteoarthritis of left knee 04/16/2019   Pseudophakia of right eye 07/26/2017   S/P total knee arthroplasty 05/10/2017   Vitamin D deficiency, unspecified 12/10/2016   CVA (cerebral infarction) 12/18/2015   Acute renal insufficiency 12/18/2015   Hyperglycemia 12/18/2015   Tremor 12/18/2015   Transient cerebral ischemic attack 12/18/2015   Aortic atherosclerosis (HCC) 08/01/2015   Bilateral renal cysts 08/01/2015   Generalized osteoarthritis 08/01/2015   Left ovarian cyst 08/01/2015   Thumb pain 07/29/2015   Acute diverticulitis 07/01/2015   Adnexal mass 07/01/2015   Underweight 11/10/2014   Muscle cramps at night 11/10/2014   Complaints of leg weakness 11/10/2014   Medicare annual wellness visit, subsequent 04/18/2014   Prolapse of female pelvic organs 07/11/2013   Screening for breast cancer 10/29/2012   Tubular adenoma of colon 10/29/2012   Familial multiple lipoprotein-type hyperlipidemia    GERD (gastroesophageal reflux disease)    S/P colonoscopy    Barrett esophagus     Oletta Cohn, OTR/L,CLT 11/17/2022, 11:47 AM  [facility] [address] [place], [address] Phone: [phone]   Fax:  [phone]  Name: Karen Parsons MRN: [id number] Date of Birth: 1941/06/16

## 2023-07-06 ENCOUNTER — Other Ambulatory Visit: Payer: Self-pay | Admitting: Internal Medicine

## 2023-07-06 DIAGNOSIS — R1310 Dysphagia, unspecified: Secondary | ICD-10-CM

## 2023-07-07 ENCOUNTER — Ambulatory Visit
Admission: RE | Admit: 2023-07-07 | Discharge: 2023-07-07 | Disposition: A | Discharge status: Home / Self Care | Payer: Medicare Other | Source: Ambulatory Visit | Attending: Internal Medicine | Admitting: Internal Medicine

## 2023-07-07 DIAGNOSIS — R1310 Dysphagia, unspecified: Secondary | ICD-10-CM | POA: Diagnosis present
